# Patient Record
Sex: Male | Born: 1950
Health system: Southern US, Community
[De-identification: ages and names within clinical notes are randomized; demographics above are authoritative.]

## PROBLEM LIST (undated history)

## (undated) DIAGNOSIS — G4730 Sleep apnea, unspecified: Secondary | ICD-10-CM

## (undated) DIAGNOSIS — K219 Gastro-esophageal reflux disease without esophagitis: Secondary | ICD-10-CM

## (undated) DIAGNOSIS — I219 Acute myocardial infarction, unspecified: Secondary | ICD-10-CM

## (undated) DIAGNOSIS — I10 Essential (primary) hypertension: Secondary | ICD-10-CM

## (undated) DIAGNOSIS — E119 Type 2 diabetes mellitus without complications: Secondary | ICD-10-CM

## (undated) DIAGNOSIS — M1990 Unspecified osteoarthritis, unspecified site: Secondary | ICD-10-CM

## (undated) DIAGNOSIS — T4145XA Adverse effect of unspecified anesthetic, initial encounter: Secondary | ICD-10-CM

## (undated) DIAGNOSIS — T8859XA Other complications of anesthesia, initial encounter: Secondary | ICD-10-CM

## (undated) DIAGNOSIS — C801 Malignant (primary) neoplasm, unspecified: Secondary | ICD-10-CM

## (undated) HISTORY — PX: PROSTATECTOMY: SHX69

## (undated) HISTORY — PX: COLONOSCOPY: SHX174

## (undated) HISTORY — PX: HEART TRANSPLANT: SHX268

## (undated) HISTORY — DX: Acute myocardial infarction, unspecified: I21.9

## (undated) HISTORY — PX: APPENDECTOMY: SHX54

## (undated) HISTORY — PX: CARDIAC CATHETERIZATION: SHX172

---

## 1997-11-25 ENCOUNTER — Encounter: Payer: Self-pay | Admitting: Emergency Medicine

## 1997-11-25 ENCOUNTER — Inpatient Hospital Stay (HOSPITAL_COMMUNITY): Admission: EM | Admit: 1997-11-25 | Discharge: 1997-11-27 | Payer: Self-pay | Admitting: Emergency Medicine

## 1998-01-20 ENCOUNTER — Ambulatory Visit (HOSPITAL_COMMUNITY): Admission: RE | Admit: 1998-01-20 | Discharge: 1998-01-20 | Payer: Self-pay | Admitting: Interventional Cardiology

## 1999-01-19 ENCOUNTER — Encounter (HOSPITAL_COMMUNITY): Admission: RE | Admit: 1999-01-19 | Discharge: 1999-04-19 | Payer: Self-pay | Admitting: Interventional Cardiology

## 1999-04-20 ENCOUNTER — Encounter (HOSPITAL_COMMUNITY): Admission: RE | Admit: 1999-04-20 | Discharge: 1999-07-19 | Payer: Self-pay | Admitting: Interventional Cardiology

## 2004-05-27 ENCOUNTER — Encounter: Admission: RE | Admit: 2004-05-27 | Discharge: 2004-05-27 | Payer: Self-pay | Admitting: Family Medicine

## 2006-10-22 ENCOUNTER — Inpatient Hospital Stay (HOSPITAL_COMMUNITY): Admission: EM | Admit: 2006-10-22 | Discharge: 2006-10-24 | Payer: Self-pay | Admitting: Emergency Medicine

## 2006-10-22 ENCOUNTER — Encounter (INDEPENDENT_AMBULATORY_CARE_PROVIDER_SITE_OTHER): Payer: Self-pay | Admitting: General Surgery

## 2007-09-11 ENCOUNTER — Encounter: Admission: RE | Admit: 2007-09-11 | Discharge: 2007-09-11 | Payer: Self-pay | Admitting: Family Medicine

## 2007-09-21 ENCOUNTER — Encounter: Admission: RE | Admit: 2007-09-21 | Discharge: 2007-09-21 | Payer: Self-pay | Admitting: Gastroenterology

## 2007-09-26 ENCOUNTER — Encounter: Admission: RE | Admit: 2007-09-26 | Discharge: 2007-09-26 | Payer: Self-pay | Admitting: Gastroenterology

## 2008-07-27 ENCOUNTER — Emergency Department (HOSPITAL_COMMUNITY): Admission: EM | Admit: 2008-07-27 | Discharge: 2008-07-27 | Payer: Self-pay | Admitting: Emergency Medicine

## 2010-04-12 ENCOUNTER — Encounter
Admission: RE | Admit: 2010-04-12 | Discharge: 2010-04-12 | Payer: Self-pay | Source: Home / Self Care | Attending: Family Medicine | Admitting: Family Medicine

## 2010-04-28 ENCOUNTER — Encounter (HOSPITAL_COMMUNITY)
Admission: RE | Admit: 2010-04-28 | Discharge: 2010-05-04 | Payer: Self-pay | Source: Home / Self Care | Attending: Urology | Admitting: Urology

## 2010-05-10 ENCOUNTER — Ambulatory Visit: Payer: Medicare Other | Attending: Radiation Oncology | Admitting: Radiation Oncology

## 2010-05-10 DIAGNOSIS — I10 Essential (primary) hypertension: Secondary | ICD-10-CM | POA: Insufficient documentation

## 2010-05-10 DIAGNOSIS — M129 Arthropathy, unspecified: Secondary | ICD-10-CM | POA: Insufficient documentation

## 2010-05-10 DIAGNOSIS — I2510 Atherosclerotic heart disease of native coronary artery without angina pectoris: Secondary | ICD-10-CM | POA: Insufficient documentation

## 2010-05-10 DIAGNOSIS — Z9089 Acquired absence of other organs: Secondary | ICD-10-CM | POA: Insufficient documentation

## 2010-05-10 DIAGNOSIS — C61 Malignant neoplasm of prostate: Secondary | ICD-10-CM | POA: Insufficient documentation

## 2010-05-10 DIAGNOSIS — K219 Gastro-esophageal reflux disease without esophagitis: Secondary | ICD-10-CM | POA: Insufficient documentation

## 2010-05-10 DIAGNOSIS — Z48298 Encounter for aftercare following other organ transplant: Secondary | ICD-10-CM | POA: Insufficient documentation

## 2010-05-10 DIAGNOSIS — Z941 Heart transplant status: Secondary | ICD-10-CM | POA: Insufficient documentation

## 2010-05-10 DIAGNOSIS — Z510 Encounter for antineoplastic radiation therapy: Secondary | ICD-10-CM | POA: Insufficient documentation

## 2010-05-10 DIAGNOSIS — E785 Hyperlipidemia, unspecified: Secondary | ICD-10-CM | POA: Insufficient documentation

## 2010-05-10 DIAGNOSIS — E119 Type 2 diabetes mellitus without complications: Secondary | ICD-10-CM | POA: Insufficient documentation

## 2010-05-10 DIAGNOSIS — Z9079 Acquired absence of other genital organ(s): Secondary | ICD-10-CM | POA: Insufficient documentation

## 2010-07-14 LAB — URINALYSIS, ROUTINE W REFLEX MICROSCOPIC
Bilirubin Urine: NEGATIVE
Glucose, UA: NEGATIVE mg/dL
Hgb urine dipstick: NEGATIVE
Ketones, ur: NEGATIVE mg/dL
Nitrite: NEGATIVE
Protein, ur: NEGATIVE mg/dL
Specific Gravity, Urine: 1.027 (ref 1.005–1.030)
Urobilinogen, UA: 0.2 mg/dL (ref 0.0–1.0)
pH: 5.5 (ref 5.0–8.0)

## 2010-08-17 NOTE — Op Note (Signed)
NAMECOLDEN, SAMARAS NO.:  [id number]   MEDICAL RECORD NO.:  [id number]          PATIENT TYPE:  INP   LOCATION:  2550                         FACILITY:  [facility]   PHYSICIAN:  Gabrielle Dare. Janee Morn, M.D.DATE OF BIRTH:  Aug 16, 1950   DATE OF PROCEDURE:  10/22/2006  DATE OF DISCHARGE:                               OPERATIVE REPORT   PREOPERATIVE DIAGNOSIS:  Acute appendicitis.   POSTOPERATIVE DIAGNOSIS:  Acute appendicitis.   PROCEDURE:  Laparoscopic appendectomy.   SURGEON:  Gabrielle Dare. Janee Morn, MD   ANESTHESIA:  General.   HISTORY OF PRESENT ILLNESS:  The patient is a 60 year old gentleman who  is status post heart transplant, who was evaluated the emergency  department for abdominal pain.  Workup demonstrated right lower quadrant  abdominal tenderness and CT scan of the abdomen and pelvis showed acute  appendicitis.  He is brought emergently to the operating room.   PROCEDURE IN DETAIL:  Informed consent was obtained.  The patient had  received intravenous antibiotics.  He was brought to the operating room.  General anesthesia was administered by the Anesthesia staff with the  assistance of the GlideScope.  The patient does have a history of a  difficulty airway.  His abdomen was prepped and draped in a sterile  fashion.  The infraumbilical region was infiltrated with 0.25% Marcaine  with epinephrine.  An infraumbilical incision was made.  Subcutaneous  tissues were dissected down revealing the anterior fascia.  This was  divided sharply and the peritoneal cavity was entered under direct  vision without difficulty.  A 0 Vicryl pursestring suture was placed  around the fascial opening and the Hasson trocar was inserted into the  abdomen.  The abdomen was insufflated with carbon dioxide in a standard  fashion.  Under direct vision a 12 mm left lower quadrant and 5-mm right  upper quadrant port were placed.  Marcaine 0.25% with epinephrine was  used at all port  sites.  Laparoscopic exploration revealed a very  inflamed appendix that was adhesed down to the peritoneal reflection.  This was bluntly dissected up with sweeping the small bowel out of the  way without difficulty.  The mesoappendix was then divided with a total  of three firings of the endoscopic GIA with a vascular load, achieving  good hemostasis.  The base of the appendix was normal, and this was  divided with endoscopic GIA with the vascular load.  The appendix was  extremely inflamed but was not frankly perforated.  The appendix was  placed in an EndoCatch bag and removed from the abdomen via the left  lower quadrant port site.  The abdomen was then copiously irrigated.  Meticulous hemostasis was assured along the staple line using Bovie  cautery on one location.  This achieved excellent hemostasis.  The  abdomen was copiously irrigated.  We used approximately 2.5 L of saline  irrigation.  Fluid returned clear.  All staple lines were rechecked and  were dry and intact.  The remainder of the irrigation fluid was  evacuated and was clear.  The ports were removed under  direct vision.  The [facility] trocar was removed.  The pneumoperitoneum was released.  The  infraumbilical fascia was closed by tying the 0 Vicryl pursestring  suture with care not to trap any intra-abdominal contents.  All three  wounds were copiously irrigated.  The skin of each was closed with a running 4-0 Vicryl subcuticular  stitch.  Sponge, needle and instrument counts were correct.  Benzoin and  Steri-Strips and sterile dressings were applied.  The patient tolerated  the procedure well without apparent complication and was taken to the  recovery room in stable condition.      Gabrielle Dare Janee Morn, M.D.  Electronically Signed     BET/MEDQ  D:  10/22/2006  T:  10/22/2006  Job:  [id number]

## 2010-08-17 NOTE — H&P (Signed)
John, [name] NO.:  [id number]   MEDICAL RECORD NO.:  [id number]          PATIENT TYPE:  EMS   LOCATION:  MAJO                         FACILITY:  [facility]   PHYSICIAN:  Gabrielle Dare. Janee Morn, M.D.DATE OF BIRTH:  Feb 01, 1951   DATE OF ADMISSION:  10/22/2006  DATE OF DISCHARGE:                              HISTORY & PHYSICAL   CHIEF COMPLAINT:  Abdominal pain.   HISTORY OF PRESENT ILLNESS:  [name] is a 60 year old gentleman who  has a one-week history of abdominal bloating.  He developed some more  generalized abdominal pain yesterday that has gradually localized to his  right lower quadrant.  He came to the [facility]  for evaluation.  CT scan of the abdomen and pelvis shows acute  appendicitis.   PAST MEDICAL HISTORY:  1. Coronary artery disease.  2. Prostate cancer.  3. Diabetes mellitus.  4. Multiple myocardial infarctions.   PAST SURGICAL HISTORY:  1. Prostatectomy.  2. Heart transplant; his transplant was done at [facility] in 2000.   SOCIAL HISTORY:  He does not smoke or drink alcohol.   ALLERGIES:  No known drug allergies.   CURRENT MEDICATIONS:  1. Aspirin 81 mg daily.  2. Calcium 600 mg b.i.d.  3. CellCept 500 mg b.i.d.  4. Glipizide 2.5 mg b.i.d.  5. Lisinopril 10 mg as needed.  6. Magnesium 133 mg daily.  7. Niacin 1000 mg b.i.d.  8. Prograf 1 mg b.i.d.  9. Ranitidine 75 mg daily.   PHYSICAL EXAMINATION:  VITAL SIGNS:  Temperature is 99.4, pulse 98,  respirations 20, blood pressure 144/81.  GENERAL:  He is awake and alert.  NECK:  Supple with no tenderness.  CHEST:  Has a scar from his old mediastinotomy.  LUNGS:  Clear to auscultation.  HEART:  Regular.  ABDOMEN:  Has some tenderness in the right lower quadrant with voluntary  guarding.  Bowel sounds are hypoactive.  He has other scars from his  prostatectomy as well.  EXTREMITIES:  Have no significant edema.  SKIN:  Warm and dry with no rashes.  NEUROLOGIC  EXAMINATION:  Glasgow Coma Scale is 15.  He is moving all  extremities well.   LABORATORY STUDIES:  Sodium 138, potassium 4.1, chloride 102, CO2 26,  BUN 13, glucose 147, lipase 17, white blood cell count 17.3, hemoglobin  18.1, platelets 177.   IMPRESSION:  Acute appendicitis.   PLAN:  Take him to the operating room emergently for a laparoscopic  appendectomy.  He is receiving intravenous antibiotics.  Procedure,  risks and benefits were discussed in detail with the patient.  Questions  were answered.      Gabrielle Dare Janee Morn, M.D.  Electronically Signed     BET/MEDQ  D:  10/22/2006  T:  10/22/2006  Job:  [id number]

## 2010-08-20 NOTE — Discharge Summary (Signed)
NAMEYASER, HARVILL NO.:  [id number]   MEDICAL RECORD NO.:  [id number]          PATIENT TYPE:  INP   LOCATION:  4731                         FACILITY:  [facility]   PHYSICIAN:  Sharlet Salina T. Hoxworth, M.D.DATE OF BIRTH:  06-16-1950   DATE OF ADMISSION:  10/22/2006  DATE OF DISCHARGE:  10/24/2006                               DISCHARGE SUMMARY   CHIEF COMPLAINT/REASON FOR ADMISSION:  Mr. Murrell is a 60 year old male  patient who had been having abdominal bloating for 1 week.  Pain became  more localized in the right lower quadrant, who presented to the [facility]  ER.  He had a low-grade fever of 99.4.  on abdominal exam, he had  tenderness in the right lower quadrant with voluntary guarding.  His  white count was elevated at 17,300, and clinical exam was suspicious for  acute appendicitis.  Therefore, he was admitted by Dr. Janee Morn with a  diagnosis of acute appendicitis.   HOSPITAL COURSE:  The patient was taken from the ER to the OR, where he  underwent a laparoscopic appendectomy for acute nonperforated  appendicitis.   On postoperative day 1, the patient was having marked decrease in pain  as compared to preoperative.  He was tolerating a solid diet, but he was  spiking fevers up to 101.  Therefore, his Unasyn was restarted.  He had  only received several doses in the immediate postoperative period.  The  patient also has a history of immunocompromised state for medications  secondary to heart transplant, and in addition, his IV fluids were  increased to 150  in the ER because of fever.   On postoperative day 2, the patient had improved remarkably.  His white  count was down to 6900.  Neutrophils 52%.  He is tolerating a diet.  His  pain was better controlled.  His abdomen was soft with bowel sounds  present.  He was otherwise deemed appropriate for discharge home.   FINAL DISCHARGE DIAGNOSES:  Acute nonperforated appendicitis status post  laparoscopic  appendectomy.   DISCHARGE MEDICATIONS:  The patient will resume the same medications he  was taking prior to admission, which include:  1. Aspirin 81 mg daily.  2. Calcium 600 mg b.i.d.  3. CellCept 500 mg b.i.d.  4. Diltiazem 120 mg daily.  5. Glipizide 2.5 mg b.i.d.  6. Lisinopril 10 mg daily.  7. Magnesium 133 mg 8 tabs daily.  8. Niacin 1000 mg b.i.d.  9. Lovaza 2 mg b.i.d.  10.Prograf 1 mg b.i.d.  11.Ranitidine 75 mg daily.  12.Vytorin 5/10 daily.  13.Percocet 5/325 1 to 2 tabs every 4 hours as needed for pain.  14.Augmentin 875 mg b.i.d. for 5 days.   WOUND CARE:  We will ask __________ to followup.   DIET:  Low-sodium, heart-healthy.   ACTIVITY:  Increase activity slowly, may walk up steps, shower for the  next 2 weeks.  No lifting more than 15 pounds the next 2 weeks.  No  driving while taking Percocet.   FOLLOWUP:  He will need to call Dr. Carollee Massed office to be seen  in 1  week.   ADDITIONAL INSTRUCTIONS:  He is to call the surgeon if:  1. Fevers greater than 101 degrees Fahrenheit.  2. New  or increased belly pain.  3. Nausea, vomiting, or diarrhea.  4. Increased drainage from wounds.      Allison L. Rennis Harding, N.P.      Lorne Skeens. Hoxworth, M.D.  Electronically Signed    ALE/MEDQ  D:  11/27/2006  T:  11/27/2006  Job:  [id number]

## 2010-09-09 ENCOUNTER — Ambulatory Visit: Payer: PRIVATE HEALTH INSURANCE | Admitting: Radiation Oncology

## 2010-10-07 ENCOUNTER — Ambulatory Visit: Payer: PRIVATE HEALTH INSURANCE | Admitting: Radiation Oncology

## 2011-01-17 LAB — DIFFERENTIAL
Basophils Absolute: 0
Basophils Absolute: 0
Basophils Absolute: 0
Basophils Relative: 0
Basophils Relative: 0
Basophils Relative: 0
Eosinophils Absolute: 0
Eosinophils Absolute: 0
Eosinophils Absolute: 0.1
Eosinophils Relative: 0
Eosinophils Relative: 0
Eosinophils Relative: 1
Lymphocytes Relative: 11 — ABNORMAL LOW
Lymphocytes Relative: 22
Lymphocytes Relative: 38
Lymphs Abs: 2
Lymphs Abs: 2.6
Monocytes Absolute: 0.6
Monocytes Absolute: 0.9 — ABNORMAL HIGH
Monocytes Absolute: 0.9 — ABNORMAL HIGH
Monocytes Relative: 5
Monocytes Relative: 9
Neutro Abs: 14.4 — ABNORMAL HIGH
Neutro Abs: 3.6
Neutrophils Relative %: 52
Neutrophils Relative %: 83 — ABNORMAL HIGH

## 2011-01-17 LAB — CBC
HCT: 36.7 — ABNORMAL LOW
Hemoglobin: 12.9 — ABNORMAL LOW
Hemoglobin: 18.1 — ABNORMAL HIGH
MCHC: 34.2
MCHC: 35
MCV: 90
MCV: 90.7
Platelets: 141 — ABNORMAL LOW
Platelets: 162
Platelets: 177
RBC: 4.04 — ABNORMAL LOW
RDW: 12.5
RDW: 12.6
WBC: 10.4
WBC: 6.9

## 2011-01-17 LAB — BASIC METABOLIC PANEL
BUN: 6
CO2: 27
Calcium: 8 — ABNORMAL LOW
Chloride: 108
Creatinine, Ser: 0.9
GFR calc Af Amer: >60
GFR calc non Af Amer: >60
Glucose, Bld: 142 — ABNORMAL HIGH
Potassium: 3.9
Sodium: 142

## 2011-01-17 LAB — COMPREHENSIVE METABOLIC PANEL
ALT: 63 — ABNORMAL HIGH
AST: 41 — ABNORMAL HIGH
Albumin: 4.3
Alkaline Phosphatase: 52
BUN: 13
CO2: 26
Calcium: 9.9
Chloride: 102
Creatinine, Ser: 0.91
GFR calc Af Amer: >60
GFR calc non Af Amer: >60
Glucose, Bld: 147 — ABNORMAL HIGH
Potassium: 4.1
Sodium: 138
Total Bilirubin: 1.3 — ABNORMAL HIGH
Total Protein: 7.2

## 2011-01-17 LAB — URINALYSIS, ROUTINE W REFLEX MICROSCOPIC
Glucose, UA: NEGATIVE
Hgb urine dipstick: NEGATIVE
pH: 6

## 2011-01-17 LAB — CULTURE, BLOOD (ROUTINE X 2): Culture: NO GROWTH

## 2011-02-10 ENCOUNTER — Telehealth: Payer: Self-pay | Admitting: Radiation Oncology

## 2011-02-10 NOTE — Telephone Encounter (Signed)
02/10/11 4:25pm Patient phoned asking about previous billing and stated that [facility] told him that Dr. Kathrynn Running did not become in-network with them until June 2012.  Have called Heather at [employer] to ask if she knows about this and told patient I would call him back.

## 2013-05-09 ENCOUNTER — Other Ambulatory Visit: Payer: Self-pay | Admitting: Gastroenterology

## 2013-05-09 DIAGNOSIS — R109 Unspecified abdominal pain: Secondary | ICD-10-CM

## 2013-05-14 ENCOUNTER — Ambulatory Visit
Admission: RE | Admit: 2013-05-14 | Discharge: 2013-05-14 | Disposition: A | Discharge status: Home / Self Care | Payer: BC Managed Care – PPO | Source: Ambulatory Visit | Attending: Gastroenterology | Admitting: Gastroenterology

## 2013-05-14 DIAGNOSIS — R109 Unspecified abdominal pain: Secondary | ICD-10-CM

## 2013-05-14 MED ORDER — IOHEXOL 300 MG/ML  SOLN
75.0000 mL | Freq: Once | INTRAMUSCULAR | Status: AC | PRN
  Administered 2013-05-14: 75 mL via INTRAVENOUS

## 2013-05-29 ENCOUNTER — Other Ambulatory Visit: Payer: Self-pay | Admitting: Gastroenterology

## 2013-05-30 NOTE — Addendum Note (Signed)
Addended by: Wilford Corner on: 05/30/2013 01:16 PM   Modules accepted: Orders

## 2013-05-31 ENCOUNTER — Encounter (HOSPITAL_COMMUNITY): Payer: Self-pay | Admitting: *Deleted

## 2013-05-31 NOTE — Progress Notes (Signed)
05/31/13 1737  OBSTRUCTIVE SLEEP APNEA  Have you ever been diagnosed with sleep apnea through a sleep study? No  Do you snore loudly (loud enough to be heard through closed doors)?  0  Do you often feel tired, fatigued, or sleepy during the daytime? 1  Has anyone observed you stop breathing during your sleep? 0  Do you have, or are you being treated for high blood pressure? 1  Age over 61 years old? 1  Gender: 1  Obstructive Sleep Apnea Score 4  Score 4 or greater  Results sent to PCP

## 2013-06-03 ENCOUNTER — Encounter (HOSPITAL_COMMUNITY): Payer: Self-pay | Admitting: *Deleted

## 2013-06-03 ENCOUNTER — Encounter (HOSPITAL_COMMUNITY)
Admission: RE | Disposition: A | Discharge status: Home / Self Care | Payer: Self-pay | Source: Ambulatory Visit | Attending: Gastroenterology

## 2013-06-03 ENCOUNTER — Encounter (HOSPITAL_COMMUNITY): Payer: BC Managed Care – PPO | Admitting: Anesthesiology

## 2013-06-03 ENCOUNTER — Ambulatory Visit (HOSPITAL_COMMUNITY): Payer: BC Managed Care – PPO | Admitting: Anesthesiology

## 2013-06-03 ENCOUNTER — Ambulatory Visit (HOSPITAL_COMMUNITY)
Admission: RE | Admit: 2013-06-03 | Discharge: 2013-06-03 | Disposition: A | Discharge status: Home / Self Care | Payer: BC Managed Care – PPO | Source: Ambulatory Visit | Attending: Gastroenterology | Admitting: Gastroenterology

## 2013-06-03 DIAGNOSIS — R1084 Generalized abdominal pain: Secondary | ICD-10-CM | POA: Diagnosis present

## 2013-06-03 DIAGNOSIS — R194 Change in bowel habit: Secondary | ICD-10-CM | POA: Diagnosis present

## 2013-06-03 DIAGNOSIS — Z9861 Coronary angioplasty status: Secondary | ICD-10-CM | POA: Insufficient documentation

## 2013-06-03 DIAGNOSIS — Z8546 Personal history of malignant neoplasm of prostate: Secondary | ICD-10-CM | POA: Insufficient documentation

## 2013-06-03 DIAGNOSIS — I2510 Atherosclerotic heart disease of native coronary artery without angina pectoris: Secondary | ICD-10-CM | POA: Insufficient documentation

## 2013-06-03 DIAGNOSIS — R195 Other fecal abnormalities: Secondary | ICD-10-CM | POA: Diagnosis present

## 2013-06-03 DIAGNOSIS — Z794 Long term (current) use of insulin: Secondary | ICD-10-CM | POA: Insufficient documentation

## 2013-06-03 DIAGNOSIS — K2990 Gastroduodenitis, unspecified, without bleeding: Secondary | ICD-10-CM

## 2013-06-03 DIAGNOSIS — K648 Other hemorrhoids: Secondary | ICD-10-CM | POA: Insufficient documentation

## 2013-06-03 DIAGNOSIS — K5730 Diverticulosis of large intestine without perforation or abscess without bleeding: Secondary | ICD-10-CM | POA: Insufficient documentation

## 2013-06-03 DIAGNOSIS — Z941 Heart transplant status: Secondary | ICD-10-CM | POA: Insufficient documentation

## 2013-06-03 DIAGNOSIS — I10 Essential (primary) hypertension: Secondary | ICD-10-CM | POA: Insufficient documentation

## 2013-06-03 DIAGNOSIS — Z87891 Personal history of nicotine dependence: Secondary | ICD-10-CM | POA: Insufficient documentation

## 2013-06-03 DIAGNOSIS — K219 Gastro-esophageal reflux disease without esophagitis: Secondary | ICD-10-CM | POA: Insufficient documentation

## 2013-06-03 DIAGNOSIS — E119 Type 2 diabetes mellitus without complications: Secondary | ICD-10-CM | POA: Insufficient documentation

## 2013-06-03 DIAGNOSIS — K2970 Gastritis, unspecified, without bleeding: Secondary | ICD-10-CM | POA: Insufficient documentation

## 2013-06-03 HISTORY — PX: COLONOSCOPY WITH PROPOFOL: SHX5780

## 2013-06-03 HISTORY — PX: ESOPHAGOGASTRODUODENOSCOPY (EGD) WITH PROPOFOL: SHX5813

## 2013-06-03 HISTORY — DX: Essential (primary) hypertension: I10

## 2013-06-03 HISTORY — DX: Gastro-esophageal reflux disease without esophagitis: K21.9

## 2013-06-03 HISTORY — DX: Other complications of anesthesia, initial encounter: T88.59XA

## 2013-06-03 HISTORY — DX: Malignant (primary) neoplasm, unspecified: C80.1

## 2013-06-03 HISTORY — DX: Type 2 diabetes mellitus without complications: E11.9

## 2013-06-03 HISTORY — DX: Adverse effect of unspecified anesthetic, initial encounter: T41.45XA

## 2013-06-03 LAB — GLUCOSE, CAPILLARY: GLUCOSE-CAPILLARY: 138 mg/dL — AB (ref 70–99)

## 2013-06-03 SURGERY — ESOPHAGOGASTRODUODENOSCOPY (EGD) WITH PROPOFOL
Anesthesia: Monitor Anesthesia Care

## 2013-06-03 MED ORDER — LACTATED RINGERS IV SOLN
INTRAVENOUS | Status: DC
  Administered 2013-06-03: 1000 mL via INTRAVENOUS

## 2013-06-03 MED ORDER — SODIUM CHLORIDE 0.9 % IV SOLN: INTRAVENOUS | Status: DC

## 2013-06-03 MED ORDER — MIDAZOLAM HCL 5 MG/5ML IJ SOLN
INTRAMUSCULAR | Status: DC | PRN
  Administered 2013-06-03 (×2): 1 mg via INTRAVENOUS

## 2013-06-03 MED ORDER — PROPOFOL INFUSION 10 MG/ML OPTIME
INTRAVENOUS | Status: DC | PRN
  Administered 2013-06-03: 100 ug/kg/min via INTRAVENOUS

## 2013-06-03 MED ORDER — LACTATED RINGERS IV SOLN
INTRAVENOUS | Status: DC | PRN
  Administered 2013-06-03: 12:00:00 via INTRAVENOUS

## 2013-06-03 MED ORDER — ONDANSETRON HCL 4 MG/2ML IJ SOLN
INTRAMUSCULAR | Status: DC | PRN
  Administered 2013-06-03: 4 mg via INTRAVENOUS

## 2013-06-03 NOTE — Interval H&P Note (Signed)
History and Physical Interval Note:  06/03/2013 11:44 AM  John [name]  has presented today for surgery, with the diagnosis of hem.positive  The various methods of treatment have been discussed with the patient and family. After consideration of risks, benefits and other options for treatment, the patient has consented to  Procedure(s): ESOPHAGOGASTRODUODENOSCOPY (EGD) WITH PROPOFOL (N/A) COLONOSCOPY WITH PROPOFOL (N/A) as a surgical intervention .  The patient's history has been reviewed, patient examined, no change in status, stable for surgery.  I have reviewed the patient's chart and labs.  Questions were answered to the patient's satisfaction.     [place] C.

## 2013-06-03 NOTE — H&P (Signed)
  Date of Initial H&P: 05/09/13  History reviewed, patient examined, no change in status, stable for surgery.

## 2013-06-03 NOTE — Anesthesia Postprocedure Evaluation (Signed)
  Anesthesia Post-op Note  Patient: John [name]  Procedure(s) Performed: Procedure(s): ESOPHAGOGASTRODUODENOSCOPY (EGD) WITH PROPOFOL (N/A) COLONOSCOPY WITH PROPOFOL (N/A)  Patient Location: Endoscopy Unit  Anesthesia Type:MAC  Level of Consciousness: awake, alert , oriented and patient cooperative  Airway and Oxygen Therapy: Patient Spontanous Breathing and Patient connected to nasal cannula oxygen  Post-op Pain: none  Post-op Assessment: Post-op Vital signs reviewed, Patient's Cardiovascular Status Stable, Respiratory Function Stable, Patent Airway, No signs of Nausea or vomiting and Pain level controlled  Post-op Vital Signs: Reviewed and stable  Complications: No apparent anesthesia complications

## 2013-06-03 NOTE — Anesthesia Preprocedure Evaluation (Addendum)
Anesthesia Evaluation  Patient identified by MRN, date of birth, ID band Patient awake    Reviewed: Allergy & Precautions, H&P , NPO status , Patient's Chart, lab work & pertinent test results  History of Anesthesia Complications (+) DIFFICULT AIRWAY and history of anesthetic complications  Airway Mallampati: III TM Distance: >3 FB Neck ROM: Full    Dental  (+) Teeth Intact, Dental Advisory Given   Pulmonary former smoker,  breath sounds clear to auscultation  Pulmonary exam normal       Cardiovascular hypertension, Pt. on medications + CAD and + Cardiac Stents Rhythm:Regular Rate:Normal  S/p heart transplant in 2000 and 2013   Pt followed up with cardiologist Dr. Stann Mainland at [facility] in February, normal exam and Echo per pt. 2/15 ECHO: EF 55%, valves OK    Neuro/Psych negative neurological ROS     GI/Hepatic Neg liver ROS, GERD-  Medicated and Controlled,  Endo/Other  diabetes, Insulin DependentGlucose 138  Renal/GU Renal InsufficiencyRenal disease (h/o ARF s/p heart transplant, required transient dialysis)negative Renal ROS   Hx of prostate cancer    Musculoskeletal   Abdominal   Peds  Hematology   Anesthesia Other Findings   Reproductive/Obstetrics                       Anesthesia Physical Anesthesia Plan  ASA: IV  Anesthesia Plan: MAC   Post-op Pain Management:    Induction: Intravenous  Airway Management Planned: Natural Airway and Simple Face Mask  Additional Equipment:   Intra-op Plan:   Post-operative Plan:   Informed Consent: I have reviewed the patients History and Physical, chart, labs and discussed the procedure including the risks, benefits and alternatives for the proposed anesthesia with the patient or authorized representative who has indicated his/her understanding and acceptance.   Dental advisory given  Plan Discussed with: Anesthesiologist, Surgeon and  CRNA  Anesthesia Plan Comments: (Plan routine monitors, MAC  )       Anesthesia Quick Evaluation

## 2013-06-03 NOTE — Preoperative (Signed)
Beta Blockers   Reason not to administer Beta Blockers:Not Applicable 

## 2013-06-03 NOTE — Transfer of Care (Signed)
Immediate Anesthesia Transfer of Care Note  Patient: John [name]  Procedure(s) Performed: Procedure(s): ESOPHAGOGASTRODUODENOSCOPY (EGD) WITH PROPOFOL (N/A) COLONOSCOPY WITH PROPOFOL (N/A)  Patient Location: Endoscopy Unit  Anesthesia Type:MAC  Level of Consciousness: awake, alert  and oriented  Airway & Oxygen Therapy: Patient Spontanous Breathing and Patient connected to nasal cannula oxygen  Post-op Assessment: Report given to PACU RN and Post -op Vital signs reviewed and stable  Post vital signs: Reviewed and stable  Complications: No apparent anesthesia complications

## 2013-06-03 NOTE — Discharge Instructions (Addendum)
Repeat colonoscopy in 10 years. If rectal bleeding or rectal pain develop, then let us know and use Anusol suppositories as needed (office will call in for you).Gastrointestinal Endoscopy Care After Refer to this sheet in the next few weeks. These instructions provide you with information on caring for yourself after your procedure. Your caregiver may also give you more specific instructions. Your treatment has been planned according to current medical practices, but problems sometimes occur. Call your caregiver if you have any problems or questions after your procedure. HOME CARE INSTRUCTIONS  If you were given medicine to help you relax (sedative), do not drive, operate machinery, or sign important documents for 24 hours.  Avoid alcohol and hot or warm beverages for the first 24 hours after the procedure.  Only take over-the-counter or prescription medicines for pain, discomfort, or fever as directed by your caregiver. You may resume taking your normal medicines unless your caregiver tells you otherwise. Ask your caregiver when you may resume taking medicines that may cause bleeding, such as aspirin, clopidogrel, or warfarin.  You may return to your normal diet and activities on the day after your procedure, or as directed by your caregiver. Walking may help to reduce any bloated feeling in your abdomen.  Drink enough fluids to keep your urine clear or pale yellow.  You may gargle with salt water if you have a sore throat. SEEK IMMEDIATE MEDICAL CARE IF:  You have severe nausea or vomiting.  You have severe abdominal pain, abdominal cramps that last longer than 6 hours, or abdominal swelling (distention).  You have severe shoulder or back pain.  You have trouble swallowing.  You have shortness of breath, your breathing is shallow, or you are breathing faster than normal.  You have a fever or a rapid heartbeat.  You vomit blood or material that looks like coffee grounds.  You have  bloody, black, or tarry stools. MAKE SURE YOU:  Understand these instructions.  Will watch your condition.  Will get help right away if you are not doing well or get worse. Document Released: 11/03/2003 Document Revised: 09/20/2011 Document Reviewed: 06/21/2011 [facility] Patient Information 2014 [place], [place]. Colonoscopy Care After These instructions give you information on caring for yourself after your procedure. Your doctor may also give you more specific instructions. Call your doctor if you have any problems or questions after your procedure. HOME CARE  Take it easy for the next 24 hours.  Rest.  Walk or use warm packs on your belly (abdomen) if you have belly cramping or gas.  Do not drive for 24 hours.  You may shower.  Do not sign important papers or use machinery for 24 hours.  Drink enough fluids to keep your pee (urine) clear or pale yellow.  Resume your normal diet. Avoid heavy or fried foods.  Avoid alcohol.  Continue taking your normal medicines.  Only take medicine as told by your doctor. Do not take aspirin. If you had growths (polyps) removed:  Do not take aspirin.  Do not drink alcohol for 7 days or as told by your doctor.  Eat a soft diet for 24 hours. GET HELP RIGHT AWAY IF:  You have a fever.  You pass clumps of tissue (blood clots) or fill the toilet with blood.  You have belly pain that gets worse and medicine does not help.  Your belly is puffy (swollen).  You feel sick to your stomach (nauseous) or throw up (vomit). MAKE SURE YOU:  Understand these instructions.  Will watch your condition.  Will get help right away if you are not doing well or get worse. Document Released: 04/23/2010 Document Revised: 06/13/2011 Document Reviewed: 11/26/2012 [facility] Patient Information 2014 [place].

## 2013-06-03 NOTE — Op Note (Signed)
[facility] [place], [address]   COLONOSCOPY PROCEDURE REPORT  PATIENT: John [name], John [name]  MR#: [id number] BIRTHDATE: 20-Jan-1951 , 62  yrs. old GENDER: Male ENDOSCOPIST: Wilford Corner, MD REFERRED JJ:HERDEYCXK Shaw, M.D. PROCEDURE DATE:  06/03/2013 PROCEDURE:   Colonoscopy, diagnostic ASA CLASS:   Class IV INDICATIONS:heme-positive stool. MEDICATIONS: See Anesthesia Report.  DESCRIPTION OF PROCEDURE:   After the risks benefits and alternatives of the procedure were thoroughly explained, informed consent was obtained.  The Pentax Ped Colon [id number]  endoscope was introduced through the anus and advanced to the cecum, which was identified by both the appendix and ileocecal valve , limited by No adverse events experienced.   The quality of the prep was good. . The instrument was then slowly withdrawn as the colon was fully examined.     FINDINGS:  Rectal exam unremarkable.  Pediatric colonoscope inserted into the colon and advanced to the cecum, where the appendiceal orifice and ileocecal valve were identified.    The terminal ileum was intubated and was normal in appearance. On careful withdrawal of the colonoscope one small sigmoid diverticuli was seen. Retroflexion revealed medium-sized nonbleeding internal hemorrhoids.  COMPLICATIONS: None  IMPRESSION:     1. Medium-sized internal hemorrhoids (likely source of heme positive stool) 2. Sigmoid diverticulum  RECOMMENDATIONS: Increase fiber in diet; Anusol suppositories as needed; Repeat colonoscopy in 10 years    ______________________________ eSigned:  Wilford Corner, MD 06/03/2013 12:37 PM   GY:JEHUDJSHF Brigitte Pulse, MD

## 2013-06-03 NOTE — Op Note (Signed)
[facility] [place], [address]   ENDOSCOPY PROCEDURE REPORT  PATIENT: John [name], John [name]  MR#: [id number] BIRTHDATE: 07-15-1950 , 62  yrs. old GENDER: Male  ENDOSCOPIST: Wilford Corner, MD REFERRED QV:ZDGLOVFIE Shaw, M.D.  PROCEDURE DATE:  06/03/2013 PROCEDURE:   EGD, diagnostic ASA CLASS:   Class IV INDICATIONS:Heme positive stool. MEDICATIONS: See Anesthesia Report.  TOPICAL ANESTHETIC: none  DESCRIPTION OF PROCEDURE:   After the risks benefits and alternatives of the procedure were thoroughly explained, informed consent was obtained.  The PENTAX GASTOROSCOPE [id number]  endoscope was introduced through the mouth and advanced to the second portion of the duodenum , limited by Without limitations.   The instrument was slowly withdrawn as the mucosa was fully examined.     FINDINGS: The endoscope was inserted into the oropharynx and esophagus was intubated.  The gastroesophageal junction was noted to be 40 cm from the incisors.  Endoscope was advanced into the stomach, which revealed minimal erythema in the distal stomach consistent with mild gastritis. The stomach was otherwise normal in appearance.  The endoscope was advanced to the duodenal bulb and second portion of duodenum which were unremarkable.  The endoscope was withdrawn back into the stomach and retroflexion revealed a normal proximal stomach.  COMPLICATIONS: None  ENDOSCOPIC IMPRESSION:     Mild gastritis otherwise normal EGD (No source of heme positive stools seen)  RECOMMENDATIONS: Proceed with colonoscopy   REPEAT EXAM: N/A  _______________________________ Wilford Corner, MD eSigned:  Wilford Corner, MD 06/03/2013 12:31 PM    PP:IRJJOACZY Brigitte Pulse, MD  PATIENT NAME:  [name], [name] MR#: [id number]

## 2013-06-04 ENCOUNTER — Encounter (HOSPITAL_COMMUNITY): Payer: Self-pay | Admitting: Gastroenterology

## 2013-08-08 ENCOUNTER — Ambulatory Visit
Admission: RE | Admit: 2013-08-08 | Discharge: 2013-08-08 | Disposition: A | Discharge status: Home / Self Care | Payer: BC Managed Care – PPO | Source: Ambulatory Visit | Attending: Physician Assistant | Admitting: Physician Assistant

## 2013-08-08 ENCOUNTER — Other Ambulatory Visit: Payer: Self-pay | Admitting: Physician Assistant

## 2013-08-08 DIAGNOSIS — R319 Hematuria, unspecified: Secondary | ICD-10-CM

## 2014-02-17 LAB — LIPID PANEL
HDL: 29 mg/dL — AB (ref 35–70)
LDL CALC: 69 mg/dL
TRIGLYCERIDES: 195 mg/dL — AB (ref 40–160)

## 2014-02-19 ENCOUNTER — Encounter: Payer: BC Managed Care – PPO | Attending: Family Medicine | Admitting: Nutrition

## 2014-02-19 DIAGNOSIS — E119 Type 2 diabetes mellitus without complications: Secondary | ICD-10-CM | POA: Diagnosis not present

## 2014-02-19 DIAGNOSIS — Z794 Long term (current) use of insulin: Secondary | ICD-10-CM | POA: Diagnosis not present

## 2014-02-19 DIAGNOSIS — Z713 Dietary counseling and surveillance: Secondary | ICD-10-CM | POA: Insufficient documentation

## 2014-02-19 NOTE — Patient Instructions (Signed)
Fill and apply one V-go 30 every day. Give 3 button presses for every meal.   Give 1 button press for snacks. Give 1 extra button press for blood sugars over 200.

## 2014-02-19 NOTE — Progress Notes (Signed)
Patient was started on V-go 30 at 9AM.  He was instructed on how to fill, apply and use the V-Go.  He filled his V-go using Novolog and applied it to his upper abdominal area.  He was told to give 3 button presses (6u) before each meal. When asked if he always took his Levemir, he reported that he took it "5 days a week".  When asked if he took his Novolog before meals, he reported that he only takes it before breakfast, even though he is at home.   We discussed low blood sugars--symptoms and treatment.  He reported good understanding of this.  He carries Glucose tablets in his car, and at home, but " has not used them in a long time".  His diet is high in carbs.  He is eating approximately 60-90 grams at breakfast.  He will stop eating the cinnamon raisin bagels, switch to whole wheat plain bagels, and add protein to his bagels or cereal in the form of nuts, or peanut butter, to slow the blood sugar rise down, and to keep him feeling full until lunch.  Lunch and supper will ususally have less carbs, and some protein, so he was not told to change anything for now.  He sometimes eats a snack after supper like an apple.  He was told to take 1 click (2u for this).  We discussed the need to take more Novolog for meals that are higher in carbs--like pizza, or pasta.  He reported good understanding of this. He was given a list of carbohydrate servings and told to take 1 click for every 2 servings.  He reported good understanding of this and had no final questions.  He ate breakfast this AM, but took no Novolog.  His blood sugar 2 hours after this was 258.  He gave himself 1 button press (2u) without any difficulty, and said that he thought he would like this.    He was encouraged to test his blood sugar before meals and at bedtime and to call me next Monday.  He agreed to do this, and had no final questions.

## 2014-06-11 ENCOUNTER — Encounter: Payer: Self-pay | Admitting: Endocrinology

## 2014-06-11 ENCOUNTER — Ambulatory Visit (INDEPENDENT_AMBULATORY_CARE_PROVIDER_SITE_OTHER): Payer: Medicare HMO | Admitting: Endocrinology

## 2014-06-11 VITALS — BP 147/92 | HR 89 | Temp 98.3°F | Resp 14 | Ht 65.5 in | Wt 175.2 lb

## 2014-06-11 DIAGNOSIS — E785 Hyperlipidemia, unspecified: Secondary | ICD-10-CM

## 2014-06-11 DIAGNOSIS — E119 Type 2 diabetes mellitus without complications: Secondary | ICD-10-CM

## 2014-06-11 LAB — HEMOGLOBIN A1C: Hgb A1c MFr Bld: 7.3 % — ABNORMAL HIGH (ref 4.6–6.5)

## 2014-06-11 NOTE — Progress Notes (Signed)
Patient ID: John [name], male   DOB: 16-Sep-1950, 64 y.o.   MRN: [id number]           Reason for Appointment: Consultation for Type 2 Diabetes  Referring physician: Mayra Neer  History of Present Illness:          Diagnosis: Type 2 diabetes mellitus, date of diagnosis: 2006       Past history:  He thinks that diagnosis time his blood sugars are significantly higher on 400 and he was taking metformin for about 2 years Because of GI side effects with metformin he was put on insulin possibly because of significantly high readings.  However details of his previous management is not available Does not think he was tried on any other drugs or injectable medications before starting insulin He has been treated with Levemir and NovoLog for most of the duration of his diabetes  Recent history: He thinks his blood sugars have been persistently poorly controlled with regimen of Levemir and NovoLog He was taking 60 units of Levemir and 8 units of mealtime doses of NovoLog  A1c in 11/15 was 8.9% and his blood sugars would be frequently in the 200 range At that time was started on a V-go pump with a 30 unit basal and usually 8 units of NovoLog at mealtimes  With this he thinks his blood sugars were much better Also he was starting to feel a little hypoglycemic at times before supper time.  He thinks his readings are not high after meals also but does not remember the readings.  He would take 2-4 units more for high carbohydrate meals like spaghetti However more recently has not been able to get his V-go pump because of insurance limitations of mail-order sources and high cost at the drug store.  Also did not like the fact that it was prominent and would stick out under his shirt Last weekend he went back to taking Levemir and NovoLog  On his own he has researched insulin pumps and is wanting to try the Medtronic pump.  He thinks his insurance will cover this Although he thinks he has been educated  regarding meal planning and carbohydrate counting he does not count carbohydrates at his meals. Currently he is checking the sugar mostly once a day, usually in the morning        Oral hypoglycemic drugs the patient is taking are: None      Side effects from medications have been: Nausea from metformin  INSULIN regimen is described as:   60 units of Levemir and 8 units before meals of NovoLog Compliance with the medical regimen: Fair  Hypoglycemia:    Glucose monitoring:  done one time a day         Glucometer: One Touch.      Blood Glucose readings by time of day and averages from meter download:  PREMEAL Breakfast Lunch Dinner  PCS   Overall   Glucose range:  87-133    71-91   118    Median:      114    Self-care: The diet that the patient has been following is: tries to limit carbohydrates .     Meals: 3 meals per day. Breakfast is bagel and cream cheese           Exercise: walks in summer, not very active currently         Dietician visit, most recent: Unknown, has seen a dietitian a few times  Weight history: He has gained weight, lowest weight has previously been 155  Wt Readings from Last 3 Encounters:  06/11/14 175 lb 3.2 oz (79.47 kg)  06/03/13 165 lb (74.844 kg)    Glycemic control:  A1c on 02/11/14 =8.9 Microalbumin/creatinine ratio in 11/15 was 11.2 Serum creatinine in 1/16 was 1.1    Lab Results  Component Value Date   HGBA1C 7.3* 06/11/2014   Lab Results  Component Value Date   CREATININE 0.90 10/24/2006         Medication List       This list is accurate as of: 06/11/14  3:24 PM.  Always use your most recent med list.               aspirin 81 MG tablet  Take 81 mg by mouth daily.     calcium carbonate 600 MG Tabs tablet  Commonly known as:  OS-CAL  Take 600 mg by mouth 2 (two) times daily with a meal.     citalopram 20 MG tablet  Commonly known as:  CELEXA  Take 20 mg by mouth daily.     insulin aspart 100 UNIT/ML  injection  Commonly known as:  novoLOG  Inject 8 Units into the skin 3 (three) times daily before meals.     insulin detemir 100 UNIT/ML injection  Commonly known as:  LEVEMIR  Inject 58 Units into the skin at bedtime.     lisinopril 10 MG tablet  Commonly known as:  PRINIVIL,ZESTRIL  Take 10 mg by mouth daily.     magnesium oxide 400 MG tablet  Commonly known as:  MAG-OX  Take 400 mg by mouth daily.     MELATONIN PO  Take 6 mg by mouth as needed.     mycophenolate 500 MG tablet  Commonly known as:  CELLCEPT  Take 1,500 mg by mouth 2 (two) times daily.     omeprazole 20 MG capsule  Commonly known as:  PRILOSEC  Take 20 mg by mouth daily.     rosuvastatin 10 MG tablet  Commonly known as:  CRESTOR  Take 10 mg by mouth daily.     tacrolimus 1 MG capsule  Commonly known as:  PROGRAF  Take 3 mg by mouth 2 (two) times daily.     V-GO 30 Kit  by Does not apply route.        Allergies:  Allergies  Allergen Reactions  . Metformin And Related     Can't tolerate    Past Medical History  Diagnosis Date  . Diabetes mellitus without complication   . Hypertension   . Cancer     prostate  . GERD (gastroesophageal reflux disease)   . Complication of anesthesia     Problems with waking up and with intubation  . Myocardial infarction     Past Surgical History  Procedure Laterality Date  . Heart transplant      Hx; of x 2  . Prostatectomy    . Colonoscopy    . Cardiac catheterization      Novemer 2014  . Appendectomy    . Esophagogastroduodenoscopy (egd) with propofol N/A 06/03/2013    Procedure: ESOPHAGOGASTRODUODENOSCOPY (EGD) WITH PROPOFOL;  Surgeon: Lear Ng, MD;  Location: [facility];  Service: Endoscopy;  Laterality: N/A;  . Colonoscopy with propofol N/A 06/03/2013    Procedure: COLONOSCOPY WITH PROPOFOL;  Surgeon: Lear Ng, MD;  Location: [facility];  Service: Endoscopy;  Laterality: N/A;    Family History  Problem Relation Age of  Onset  . Heart disease Mother   . Heart disease Father   . Diabetes Brother   . Cancer Brother     Social History:  reports that he has quit smoking. He has never used smokeless tobacco. He reports that he drinks alcohol. He reports that he does not use illicit drugs.    Review of Systems       Vision is normal. Most recent eye exam was in 4/15        Lipids: On Crestor 2 years.  Results of lipids not available      No results found for: CHOL, HDL, LDLCALC, LDLDIRECT, TRIG, CHOLHDL                Skin: No rash or infections     Thyroid:  No  unusual fatigue, no history of thyroid disease.     The blood pressure has been usually normal at home around 122/76-80.  Tends to be higher in doctor's offices.  He is on lisinopril 10 mg only     No swelling of feet.     No shortness of breath usually on exertion.  No exertional chest tightness or palpitations  He is followed at [facility], is about 2 years since his second heart transplant and is reportedly doing well     Bowel habits: Normal.      No joint  pains.          No history of Numbness, tingling or burning in feet      Physical Examination:  BP 147/92 mmHg  Pulse 89  Temp(Src) 98.3 F (36.8 C)  Resp 14  Ht 5' 5.5" (1.664 m)  Wt 175 lb 3.2 oz (79.47 kg)  BMI 28.70 kg/m2  SpO2 98%  GENERAL:    he is averagely built and nourished.  Has abdominal obesity present HEENT:         Eye exam shows normal external appearance. Fundus exam shows no retinopathy. Oral exam shows normal mucosa .  NECK:         General:  Neck exam shows no lymphadenopathy. Carotids are normal to palpation and no bruit heard.  Thyroid is not enlarged and no nodules felt.   LUNGS:         Chest is symmetrical. Lungs are clear to auscultation.Marland Kitchen   HEART:         Heart sounds:  S1 and S2 are normal. No murmurs or clicks heard., no S3 or S4.   ABDOMEN:   There is no distention present. Liver and spleen are not palpable. No other mass or  tenderness present.  EXTREMITIES:     There is no edema. No skin lesions present.Marland Kitchen  NEUROLOGICAL:   Vibration sense is minimally  reduced in toes. Ankle jerks are absent on the left and 2+ on the right Diabetic foot exam shows normal monofilament sensation in the toes and plantar surfaces, no skin lesions or ulcers on the feet and normal pedal pulses MUSCULOSKELETAL:       There is no enlargement or deformity of the joints. Spine is normal to inspection.Marland Kitchen   SKIN:       No rash or lesions of concern.        ASSESSMENT:  Diabetes type 2 with BMI 29 and requiring insulin Although he has not been tried on GLP-1 drugs or drugs like Invokana he has had significant hyperglycemia since onset and currently doing well with insulin regimen with the  V-go pump infusion program Previously would be taking 70-80 units of insulin a day with poor control and A1c near 9%  Since he is keen on using an insulin pump which would give him more flexibility and control than the V-go pump will proceed with ordering an insulin pump.  However discussed that he may subjectively like the Omnipod better because of its waterproof nature and lack of tubing that would not be interfering with his daily activities.  Also he would be able to bolus from his glucose monitor also.  He can apply this on his upper arm on the back Basic principles of insulin pump treatment and the Omnipod were discussed and brochure given  Complications: None evident, has had normal urine microalbumin and no previous history of retinopathy  PLAN:   He will check into the details about the Omnipod pump and insurance coverage as well as some out-of-pocket expense.  Will start him on the insulin pump once he is able to get this approved.  Most likely his basal rate will need to be started at 1.0 round the clock except 0.8 between 3 PM-7 PM when he tends to get low.  Carbohydrate coverage will be about 1:10 to start with  In the meantime he does need to  start checking his blood sugars 3-4 times a day especially after meals to help adjust his mealtime coverage  He will try to watch his blood pressure closely as it tends to be high  Will review records of previous lipid panel when available  Patient Instructions  Please check blood sugars at least half the time about 2 hours after any meal and 3 times per week on waking up. Please bring blood sugar monitor to each visit. Recommended blood sugar levels about 2 hours after meal is 140-180 and on waking up 90-130     Counseling time over 50% of today's 50 minute visit  John [name] 06/11/2014, 3:24 PM   Note: This office note was prepared with Estate agent. Any transcriptional errors that result from this process are unintentional.

## 2014-06-11 NOTE — Patient Instructions (Addendum)
Please check blood sugars at least half the time about 2 hours after any meal and 3 times per week on waking up.  Please bring blood sugar monitor to each visit.  Recommended blood sugar levels about 2 hours after meal is 140-180 and on waking up 90-130   

## 2014-06-11 NOTE — Progress Notes (Signed)
Quick Note:  Please let patient know that the A1c was 7.3, probably having some high readings after meals  ______

## 2014-06-18 ENCOUNTER — Telehealth: Payer: Self-pay | Admitting: Endocrinology

## 2014-06-18 NOTE — Telephone Encounter (Signed)
Please check status of insulin pump form sent by [place] do you have this and what is the status. Please fax back if complete, or call mario # [phone] ext [phone] Send form with last 2 MD notes

## 2014-06-19 NOTE — Telephone Encounter (Signed)
Forms faxed

## 2014-06-19 NOTE — Telephone Encounter (Signed)
Please read message below. Do you still have form on your desk?

## 2014-06-19 NOTE — Telephone Encounter (Signed)
On Megan's desk

## 2014-06-25 ENCOUNTER — Other Ambulatory Visit: Payer: Self-pay | Admitting: *Deleted

## 2014-06-25 MED ORDER — INSULIN ASPART 100 UNIT/ML ~~LOC~~ SOLN: SUBCUTANEOUS | Status: DC

## 2014-06-30 ENCOUNTER — Telehealth: Payer: Self-pay | Admitting: Endocrinology

## 2014-06-30 NOTE — Telephone Encounter (Signed)
Patient will call when he needs a refill of Humalog per insurance requirement

## 2014-06-30 NOTE — Telephone Encounter (Signed)
Pt needs [place] to call #: (pt to call back with #)so we can tell them that he has tried and failed other therapies and the novolog is necessary

## 2014-06-30 NOTE — Telephone Encounter (Signed)
Needs PA on novlog  Call @1 [phone] for PA

## 2014-07-02 NOTE — Telephone Encounter (Signed)
Pt now needs [place] to call and get an appeal # [phone] on his omnipod pda. They approved the omnipod itself but not the pda Reference # [id number]

## 2014-07-03 NOTE — Telephone Encounter (Signed)
See note below   Thanks

## 2014-07-07 ENCOUNTER — Telehealth: Payer: Self-pay | Admitting: Endocrinology

## 2014-07-07 NOTE — Telephone Encounter (Signed)
Pt requesting phone about insurance not covering a item ,pt would not explain over the telphone

## 2014-07-10 ENCOUNTER — Telehealth: Payer: Self-pay | Admitting: *Deleted

## 2014-07-10 NOTE — Telephone Encounter (Signed)
Fax on Dr Ronnie Derby desk for [employer].

## 2014-07-10 NOTE — Telephone Encounter (Signed)
ok 

## 2014-07-10 NOTE — Telephone Encounter (Signed)
Patient is calling because his insurance will not pay for omni-pod insulin pump because it is disposable.  He hopes that they will pay for a Medtronics.  He will have the paperwork faxed over.  He would like to know if it is okay for him to use the Medtronics pump?

## 2014-07-15 ENCOUNTER — Other Ambulatory Visit: Payer: Self-pay | Admitting: Endocrinology

## 2014-07-15 NOTE — Telephone Encounter (Signed)
Patient called and did not leave a detailed message   Please call patient   Thank you

## 2014-07-16 NOTE — Telephone Encounter (Signed)
LVM for John [name] (at [facility]) to call me back.   RE: need the fax number to send the OV notes to.

## 2014-07-16 NOTE — Telephone Encounter (Signed)
Pt wanted to know if the paper work was sent to [employer].  Pt called medtronics and [employer] stated that they needed MD notes. ([phone]/Mario pt account manager xt: [phone]).  Pt stated that since the novolog is not covered and that he was switched to the humalog.

## 2014-07-16 NOTE — Telephone Encounter (Signed)
erx is saved in the phone note and waiting for approval of pump before rx is sent.

## 2014-07-17 NOTE — Telephone Encounter (Signed)
Spoke to [address]. He requested a second office visit note.   Faxed OV notes to [phone]  Waiting on approval from this point on.

## 2014-07-17 NOTE — Telephone Encounter (Signed)
Informed pt that information was faxed.

## 2014-07-18 ENCOUNTER — Other Ambulatory Visit: Payer: Self-pay | Admitting: *Deleted

## 2014-07-18 MED ORDER — INSULIN LISPRO 100 UNIT/ML ~~LOC~~ SOLN
SUBCUTANEOUS | Status: DC
Start: 2014-07-18 — End: 2014-10-06

## 2014-07-18 NOTE — Telephone Encounter (Signed)
He wanted to wait to have the Humilin filled until there was a decision on the Medtronic

## 2014-07-22 ENCOUNTER — Telehealth: Payer: Self-pay | Admitting: Endocrinology

## 2014-07-22 NOTE — Telephone Encounter (Signed)
Pt requesting call back

## 2014-08-18 ENCOUNTER — Encounter: Payer: Medicare HMO | Attending: Endocrinology | Admitting: Nutrition

## 2014-08-18 ENCOUNTER — Ambulatory Visit (INDEPENDENT_AMBULATORY_CARE_PROVIDER_SITE_OTHER): Payer: Medicare HMO | Admitting: Endocrinology

## 2014-08-18 ENCOUNTER — Encounter: Payer: Self-pay | Admitting: Endocrinology

## 2014-08-18 VITALS — BP 158/102 | HR 94 | Temp 98.0°F | Resp 14 | Ht 65.5 in | Wt 175.4 lb

## 2014-08-18 DIAGNOSIS — IMO0002 Reserved for concepts with insufficient information to code with codable children: Secondary | ICD-10-CM

## 2014-08-18 DIAGNOSIS — E1165 Type 2 diabetes mellitus with hyperglycemia: Secondary | ICD-10-CM | POA: Diagnosis not present

## 2014-08-18 NOTE — Patient Instructions (Addendum)
Bolus 1:8 carb ratio for meals, correction 1:40  Basal 1.0 unit except 0.9 from 3-6 pm  Check sugars before and 2 hrs after meals and 3 am

## 2014-08-18 NOTE — Progress Notes (Signed)
Patient ID: John [name], male   DOB: 1950/12/20, 64 y.o.   MRN: [id number]           Reason for Appointment: Follow-up for Type 2 Diabetes  Referring physician: Mayra Neer  History of Present Illness:          Diagnosis: Type 2 diabetes mellitus, date of diagnosis: 2006       Past history:  He thinks that diagnosis time his blood sugars are significantly higher on 400 and he was taking metformin for about 2 years Because of GI side effects with metformin he was put on insulin possibly because of significantly high readings.  However details of his previous management is not available Does not think he was tried on any other drugs or injectable medications before starting insulin He has been treated with Levemir and NovoLog for most of the duration of his diabetes  Recent history:   A1c in 11/15 was 8.9% and his blood sugars would be frequently in the 200 range with Levemir Novolog At that time was started on a V-go pump with a 30 unit basal and usually 8 units of NovoLog at mealtimes However because of difficulty with insurance coverage with this as well as somewhat inconsistent control and hypoglycemia he went back to Levemir/Novolog  He is now starting on an Omnipod insulin pump He has not checked his blood sugars much lately and mostly in the mornings which are consistently high and averaging about 228 He has only some familiarity with carbohydrate counting and is generally adjusting his Novolog based on meal size and fat content also        Oral hypoglycemic drugs the patient is taking are: None      Side effects from medications have been: Nausea from metformin  INSULIN regimen is described as:   60 units of Levemir and 8 units before meals of NovoLog Compliance with the medical regimen: Fair  Hypoglycemia:    Glucose monitoring:  done one time a day or less        Glucometer: One Touch.      Blood Glucose readings as above.  Self-care: The diet that the patient has  been following is: tries to limit carbohydrates .     Meals: 3 meals per day. Breakfast is bagel and cream cheese           Exercise: walks in summer,        Dietician visit, most recent: Unknown, has seen a dietitian a few times                Weight history: He has gained weight, lowest weight has previously been 155  Wt Readings from Last 3 Encounters:  08/18/14 175 lb 6.4 oz (79.561 kg)  06/11/14 175 lb 3.2 oz (79.47 kg)  06/03/13 165 lb (74.844 kg)   Glycemic control:  A1c on 02/11/14 =8.9 Microalbumin/creatinine ratio in 11/15 was 11.2 Serum creatinine in 1/16 was 1.1    Lab Results  Component Value Date   HGBA1C 7.3* 06/11/2014   Lab Results  Component Value Date   CREATININE 0.90 10/24/2006         Medication List       This list is accurate as of: 08/18/14 12:30 PM.  Always use your most recent med list.               aspirin 81 MG tablet  Take 81 mg by mouth daily.     calcium carbonate 600 MG  Tabs tablet  Commonly known as:  OS-CAL  Take 600 mg by mouth 2 (two) times daily with a meal.     citalopram 20 MG tablet  Commonly known as:  CELEXA  Take 20 mg by mouth daily.     insulin aspart 100 UNIT/ML injection  Commonly known as:  novoLOG  Use max 70 units per day with insulin pump     insulin detemir 100 UNIT/ML injection  Commonly known as:  LEVEMIR  Inject 58 Units into the skin at bedtime.     insulin lispro 100 UNIT/ML injection  Commonly known as:  HUMALOG  Use max 70 units per day with insulin pump     lisinopril 10 MG tablet  Commonly known as:  PRINIVIL,ZESTRIL  Take 10 mg by mouth daily.     magnesium oxide 400 MG tablet  Commonly known as:  MAG-OX  Take 400 mg by mouth daily.     MELATONIN PO  Take 6 mg by mouth as needed.     mycophenolate 500 MG tablet  Commonly known as:  CELLCEPT  Take 1,500 mg by mouth 2 (two) times daily.     omeprazole 20 MG capsule  Commonly known as:  PRILOSEC  Take 20 mg by mouth daily.      rosuvastatin 10 MG tablet  Commonly known as:  CRESTOR  Take 10 mg by mouth daily.     tacrolimus 1 MG capsule  Commonly known as:  PROGRAF  Take 3 mg by mouth 2 (two) times daily.     V-GO 30 Kit  by Does not apply route.        Allergies:  Allergies  Allergen Reactions  . Metformin And Related     Can't tolerate    Past Medical History  Diagnosis Date  . Diabetes mellitus without complication   . Hypertension   . Cancer     prostate  . GERD (gastroesophageal reflux disease)   . Complication of anesthesia     Problems with waking up and with intubation  . Myocardial infarction     Past Surgical History  Procedure Laterality Date  . Heart transplant      Hx; of x 2  . Prostatectomy    . Colonoscopy    . Cardiac catheterization      Novemer 2014  . Appendectomy    . Esophagogastroduodenoscopy (egd) with propofol N/A 06/03/2013    Procedure: ESOPHAGOGASTRODUODENOSCOPY (EGD) WITH PROPOFOL;  Surgeon: Lear Ng, MD;  Location: [facility];  Service: Endoscopy;  Laterality: N/A;  . Colonoscopy with propofol N/A 06/03/2013    Procedure: COLONOSCOPY WITH PROPOFOL;  Surgeon: Lear Ng, MD;  Location: [facility];  Service: Endoscopy;  Laterality: N/A;    Family History  Problem Relation Age of Onset  . Heart disease Mother   . Heart disease Father   . Diabetes Brother   . Cancer Brother     Social History:  reports that he has quit smoking. He has never used smokeless tobacco. He reports that he drinks alcohol. He reports that he does not use illicit drugs.    Review of Systems   Most recent eye exam was in 4/15        Lipids: On Crestor 2 years.  Results of lipids not available      No results found for: CHOL, HDL, LDLCALC, LDLDIRECT, TRIG, CHOLHDL              He is followed at [facility]  regularly, is about 2 years since his second heart transplant and is reportedly doing well    Physical Examination:  BP 158/102 mmHg  Pulse 94   Temp(Src) 98 F (36.7 C)  Resp 14  Ht 5' 5.5" (1.664 m)  Wt 175 lb 6.4 oz (79.561 kg)  BMI 28.73 kg/m2  SpO2 97%       ASSESSMENT:  Diabetes type 2 with BMI 29 and requiring insulin He is starting an insulin pump today using the Omnipod He will be instructed by the nurse educator Discussed basic principles of using insulin pump and the need to adjust the basal rates at various times based on blood sugar patterns Also discussed importance of carbohydrate counting to manage mealtime boluses He does try to be active and usually in the mornings but does not do any intense physical activity   PLAN:   He will start with 1 unit per hour on the insulin pump with lower basal rate in the late afternoon  He will be instructed on 1:8 carbohydrate ratio for mealtime coverage and nurse educator will provide him booklets on carb counting  Correction factor will be 1:40 with target 120  Will review his blood sugars and follow-up tomorrow  Patient Instructions  Bolus 1:8 carb ratio for meals, correction 1:40  Basal 1.0 unit except 0.9 from 3-6 pm  Check sugars before and 2 hrs after meals and 3 am      [facility] 08/18/2014, 12:30 PM   Note: This office note was prepared with Estate agent. Any transcriptional errors that result from this process are unintentional.

## 2014-08-19 ENCOUNTER — Ambulatory Visit (INDEPENDENT_AMBULATORY_CARE_PROVIDER_SITE_OTHER): Payer: Medicare HMO | Admitting: Endocrinology

## 2014-08-19 ENCOUNTER — Other Ambulatory Visit: Payer: Self-pay | Admitting: *Deleted

## 2014-08-19 ENCOUNTER — Encounter: Payer: Medicare HMO | Admitting: Nutrition

## 2014-08-19 VITALS — BP 130/86 | HR 90 | Temp 98.6°F | Resp 16 | Wt 175.4 lb

## 2014-08-19 DIAGNOSIS — E1165 Type 2 diabetes mellitus with hyperglycemia: Secondary | ICD-10-CM

## 2014-08-19 DIAGNOSIS — E119 Type 2 diabetes mellitus without complications: Secondary | ICD-10-CM

## 2014-08-19 DIAGNOSIS — IMO0002 Reserved for concepts with insufficient information to code with codable children: Secondary | ICD-10-CM

## 2014-08-19 MED ORDER — GLUCOSE BLOOD VI STRP: ORAL_STRIP | Status: AC

## 2014-08-19 MED ORDER — ACCU-CHEK FASTCLIX LANCETS MISC: Status: AC

## 2014-08-19 NOTE — Progress Notes (Signed)
John [name] was instucted on the use of his OmniPod insulin pump.  He had already put the date/time, name etc. Into his PDM.  He entered the basal rates and carb calculations with little assistance from me.:  Basal rates :  1.0u/hr except from 3-6PM: 0.9u/hr Carb ratio: 8, sensitivitiy: 40 timing 4 hours, target: 90-110, correction over 110. We reviewed how to give a bolus and he re demonstrated this correctly X2.   He was given a form to record blood sugars and told to test ac, 2hr. Pc, HS and 3AM.   He took his lantus last night at10 PM, and so his pump was put in a 100 % basal reduction until 8PM.   He had no final questions.

## 2014-08-19 NOTE — Patient Instructions (Signed)
No change; call if glucose unusually hi or low

## 2014-08-19 NOTE — Progress Notes (Signed)
He reported no difficulties giving boluses at lunch and at supper.  He had applied the pod yesterday to his upper left arm, and said it hurt all day yesterday.  We filled a new pod and applied it to his upper right arm, and he reported that he did not hurt.   We reviewed how/when to do a duel wave bolus, and temp. Basal rates.   He was given a sheet on high blood sugar protocols for pump wearers, and we reviewed this.  He had no questions.  He was given another sheet off different sites to put his pods.  It was stressed that he needs to rotate sites and not to use the same site for 1-2 weeks.  He reported good understanding of this. He has used [employer] for [employer], and had no questions about how to use this.   He signed off on understanding all topics and and had no final questions.  He will continue to test ac and HS and see Dr. Dwyane Dee tomorrow.

## 2014-08-19 NOTE — Progress Notes (Signed)
Patient ID: John [name], male   DOB: 1950-09-02, 64 y.o.   MRN: [id number]           Reason for Appointment: Follow-up for Type 2 Diabetes  Referring physician: Mayra Neer  History of Present Illness:          Diagnosis: Type 2 diabetes mellitus, date of diagnosis: 2006       Past history:  He thinks that diagnosis time his blood sugars are significantly higher on 400 and he was taking metformin for about 2 years Because of GI side effects with metformin he was put on insulin possibly because of significantly high readings.  However details of his previous management is not available Does not think he was tried on any other drugs or injectable medications before starting insulin He has been treated with Levemir and NovoLog for most of the duration of his diabetes  Recent history:   PUMP settings:  Basal rate 1.0 except 0.9 from 3 PM-6 PM   Carb ratio 1:8.  Correction 1:40 with target 90 -110    A1c in 11/15 was 8.9% and his blood sugars would be frequently in the 200 range with Levemir Novolog At that time was started on a V-go pump with a 30 unit basal and usually 8 units of NovoLog at mealtimes However because of difficulty with insurance coverage with this as well as somewhat inconsistent control and hypoglycemia he went back to Levemir/Novolog  He is now starting on an Omnipod insulin pump  On 08/18/14.  Last evening after starting the pump he had discomfort in the local area but this is not present now and has no discomfort with the bolus  Blood sugars there are fairly good with readings mostly around 140 since last evening  He did have high reading after lunch but eat out at a restaurant and did not account for any fat content, also  Had white rice   He has been able to count carbohydrates fairly well        Oral hypoglycemic drugs the patient is taking are: None      Side effects from medications have been: Nausea from metformin  Compliance with the medical regimen: Fair   Hypoglycemia:    Glucose monitoring:  done one time a day or less        Glucometer: One Touch.      Blood Glucose readings  PRE-MEAL Breakfast Lunch Dinner Bedtime  4 AM  Glucose range:  146  154  140  149  129  Mean/median:        POST-MEAL PC Breakfast PC Lunch PC Dinner  Glucose range:   252  141  Mean/median:       Self-care: The diet that the patient has been following is: tries to limit carbohydrates .     Meals: 3 meals per day. Breakfast is bagel and cream cheese           Exercise: walks in summer, some activity at work        Dietician visit, most recent: Unknown, has seen a dietitian a few times                Weight history: He has gained weight, lowest weight has previously been 155  Wt Readings from Last 3 Encounters:  08/19/14 175 lb 6.4 oz (79.561 kg)  08/18/14 175 lb 6.4 oz (79.561 kg)  06/11/14 175 lb 3.2 oz (79.47 kg)   Glycemic control:  A1c on 02/11/14 =8.9  Microalbumin/creatinine ratio in 11/15 was 11.2 Serum creatinine in 1/16 was 1.1    Lab Results  Component Value Date   HGBA1C 7.3* 06/11/2014   Lab Results  Component Value Date   CREATININE 0.90 10/24/2006         Medication List       This list is accurate as of: 08/19/14 10:24 AM.  Always use your most recent med list.               aspirin 81 MG tablet  Take 81 mg by mouth daily.     calcium carbonate 600 MG Tabs tablet  Commonly known as:  OS-CAL  Take 600 mg by mouth 2 (two) times daily with a meal.     citalopram 20 MG tablet  Commonly known as:  CELEXA  Take 20 mg by mouth daily.     insulin aspart 100 UNIT/ML injection  Commonly known as:  novoLOG  Use max 70 units per day with insulin pump     insulin detemir 100 UNIT/ML injection  Commonly known as:  LEVEMIR  Inject 58 Units into the skin at bedtime.     insulin lispro 100 UNIT/ML injection  Commonly known as:  HUMALOG  Use max 70 units per day with insulin pump     lisinopril 10 MG tablet  Commonly  known as:  PRINIVIL,ZESTRIL  Take 10 mg by mouth daily.     magnesium oxide 400 MG tablet  Commonly known as:  MAG-OX  Take 400 mg by mouth daily.     MELATONIN PO  Take 6 mg by mouth as needed.     mycophenolate 500 MG tablet  Commonly known as:  CELLCEPT  Take 1,500 mg by mouth 2 (two) times daily.     omeprazole 20 MG capsule  Commonly known as:  PRILOSEC  Take 20 mg by mouth daily.     rosuvastatin 10 MG tablet  Commonly known as:  CRESTOR  Take 10 mg by mouth daily.     tacrolimus 1 MG capsule  Commonly known as:  PROGRAF  Take 3 mg by mouth 2 (two) times daily.     V-GO 30 Kit  by Does not apply route.        Allergies:  Allergies  Allergen Reactions  . Metformin And Related     Can't tolerate    Past Medical History  Diagnosis Date  . Diabetes mellitus without complication   . Hypertension   . Cancer     prostate  . GERD (gastroesophageal reflux disease)   . Complication of anesthesia     Problems with waking up and with intubation  . Myocardial infarction     Past Surgical History  Procedure Laterality Date  . Heart transplant      Hx; of x 2  . Prostatectomy    . Colonoscopy    . Cardiac catheterization      Novemer 2014  . Appendectomy    . Esophagogastroduodenoscopy (egd) with propofol N/A 06/03/2013    Procedure: ESOPHAGOGASTRODUODENOSCOPY (EGD) WITH PROPOFOL;  Surgeon: Lear Ng, MD;  Location: [place];  Service: Endoscopy;  Laterality: N/A;  . Colonoscopy with propofol N/A 06/03/2013    Procedure: COLONOSCOPY WITH PROPOFOL;  Surgeon: Lear Ng, MD;  Location: [facility];  Service: Endoscopy;  Laterality: N/A;    Family History  Problem Relation Age of Onset  . Heart disease Mother   . Heart disease Father   . Diabetes Brother   .  Cancer Brother     Social History:  reports that he has quit smoking. He has never used smokeless tobacco. He reports that he drinks alcohol. He reports that he does not use  illicit drugs.    Review of Systems   Most recent eye exam was in 4/15        Lipids: On Crestor 2 years.  Results of lipids not available      No results found for: CHOL, HDL, LDLCALC, LDLDIRECT, TRIG, CHOLHDL              He is followed at [facility], is about 2 years since his second heart transplant and is reportedly doing well    Physical Examination:  BP 130/86 mmHg  Pulse 90  Temp(Src) 98.6 F (37 C)  Resp 16  Wt 175 lb 6.4 oz (79.561 kg)  SpO2 96%       ASSESSMENT:  Diabetes type 2 with BMI 29 and requiring insulin He is  Doing well with starting an insulin pump using the Omnipod  his blood sugars are mostly around 140 since last evening and lower overnight at 129   However he did not start the pump basal rate until 8 PM because of having Levemir tonight before   PLAN:   He will continue the same basal rate for now an review again in the morning   may need extra 1-2 units for higher fat meals   will discuss using the temporary basal for increased activity levels on his follow-up  Will review his blood sugars  In follow-up tomorrow   continue instructions on pump management  Patient Instructions  No change; call if glucose unusually hi or low      Loralai Eisman 08/19/2014, 10:24 AM   Note: This office note was prepared with Dragon voice recognition system technology. Any transcriptional errors that result from this process are unintentional.

## 2014-08-19 NOTE — Patient Instructions (Signed)
Test blood sugars before meals, 2hr after meals, bedtime and 3AM Read over manual

## 2014-08-20 ENCOUNTER — Ambulatory Visit (INDEPENDENT_AMBULATORY_CARE_PROVIDER_SITE_OTHER): Payer: 59 | Admitting: Endocrinology

## 2014-08-20 ENCOUNTER — Encounter: Payer: Medicare HMO | Admitting: Nutrition

## 2014-08-20 DIAGNOSIS — E119 Type 2 diabetes mellitus without complications: Secondary | ICD-10-CM

## 2014-08-20 NOTE — Progress Notes (Signed)
John [name] made changes to his basal rates and I/C ratio per Dr. Ronnie Derby order,  with very little assistance from me.   Changes made:  Basal rate: 1.1 from 7-9AM, and 3-6PM: 0.95.    I/C ratio: 1:7 for lunch (11AM-1PM).   Pt. Reports no discomfort wearing his pod last night or today.  He refused a review on how to change his pod, saying he had no questions.   Calorie [employer] book given for when he eats out.  We reviewed how to read this and what things to look for when determining the carb counts.  He reported good understanding of this.   We reviewed temp basals andextended boluses --when and how to do this, sick days, high blood sugar protocols.  He was given handouts for sick day and high blood sugar protocols and told to read them over, and call if questions. He had no final questions. He signed off on understanding all topics.  He will call if questions.

## 2014-08-20 NOTE — Progress Notes (Signed)
Patient ID: John [name], male   DOB: 1950-08-24, 64 y.o.   MRN: [id number]           Reason for Appointment: Follow-up for Type 2 Diabetes  Referring physician: Lupita Raider  History of Present Illness:          Diagnosis: Type 2 diabetes mellitus, date of diagnosis: 2006       Past history:  He thinks that diagnosis time his blood sugars are significantly higher on 400 and he was taking metformin for about 2 years Because of GI side effects with metformin he was put on insulin possibly because of significantly high readings.  However details of his previous management is not available Does not think he was tried on any other drugs or injectable medications before starting insulin He has been treated with Levemir and NovoLog for most of the duration of his diabetes  Recent history:   PUMP settings:  Basal rate 1.0 except 0.9 from 3 PM-6 PM   Carb ratio 1:8.  Correction 1:40 with target 90 -110    A1c in 11/15 was 8.9% and his blood sugars would be frequently in the 200 range with Levemir Novolog At that time was started on a V-go pump with a 30 unit basal and usually 8 units of NovoLog at mealtimes However because of difficulty with insurance coverage with this as well as somewhat inconsistent control and hypoglycemia he went back to Levemir/Novolog  He is now starting on an Omnipod insulin pump  On 08/18/14.  With starting the Omnipod pump his blood sugars have been excellent as described below His settings were not changed yesterday His patterns and management are as follows:  Fasting readings are mildly increased  Postprandial readings are good after evening meal and breakfast yesterday but higher after lunch fairly consistently because of eating out.  However he thinks he is generally eating low fat meals  His activity level is somewhat variable and does not cause low sugars  Blood sugar before supper are minimally increased  He is getting better counting carbohydrates  but did not add any extra for the higher fat meals yesterday at lunch  No further discomfort with using the pump and is able to change the Omnipod as per instructions yesterday        Oral hypoglycemic drugs the patient is taking are: None      Side effects from medications have been: Nausea from metformin  Compliance with the medical regimen: good Hypoglycemia:    Glucose monitoring:  recently up to 5 times a day     Glucometer:Freestyle Blood Glucose readings  PRE-MEAL Breakfast Lunch Dinner Bedtime Overall  Glucose range: 139, 146 129 136, 140    Mean/median:        POST-MEAL PC Breakfast PC Lunch PC Dinner  Glucose range: 129 181, 252 131, 141  Mean/median:        Self-care: The diet that the patient has been following is: tries to limit carbohydrates .     Meals: 3 meals per day. Breakfast is bagel and cream cheese           Exercise: walks in summer, some activity at work        Dietician visit, most recent: Unknown, has seen a dietitian a few times                Weight history: He has gained weight, lowest weight has previously been 155  Wt Readings from Last 3 Encounters:  08/19/14 175 lb 6.4 oz (79.561 kg)  08/18/14 175 lb 6.4 oz (79.561 kg)  06/11/14 175 lb 3.2 oz (79.47 kg)   Glycemic control:  A1c on 02/11/14 =8.9 Microalbumin/creatinine ratio in 11/15 was 11.2 Serum creatinine in 1/16 was 1.1    Lab Results  Component Value Date   HGBA1C 7.3* 06/11/2014   Lab Results  Component Value Date   CREATININE 0.90 10/24/2006         Medication List       This list is accurate as of: 08/20/14  1:48 PM.  Always use your most recent med list.               ACCU-CHEK FASTCLIX LANCETS Misc  Use to check blood sugar 6 times per day dx code E11.9     aspirin 81 MG tablet  Take 81 mg by mouth daily.     calcium carbonate 600 MG Tabs tablet  Commonly known as:  OS-CAL  Take 600 mg by mouth 2 (two) times daily with a meal.     citalopram 20 MG  tablet  Commonly known as:  CELEXA  Take 20 mg by mouth daily.     glucose blood test strip  Commonly known as:  FREESTYLE TEST STRIPS  Use as instructed to check blood sugar 6 times per day dx code E11.9     insulin aspart 100 UNIT/ML injection  Commonly known as:  novoLOG  Use max 70 units per day with insulin pump     insulin detemir 100 UNIT/ML injection  Commonly known as:  LEVEMIR  Inject 58 Units into the skin at bedtime.     insulin lispro 100 UNIT/ML injection  Commonly known as:  HUMALOG  Use max 70 units per day with insulin pump     lisinopril 10 MG tablet  Commonly known as:  PRINIVIL,ZESTRIL  Take 10 mg by mouth daily.     magnesium oxide 400 MG tablet  Commonly known as:  MAG-OX  Take 400 mg by mouth daily.     MELATONIN PO  Take 6 mg by mouth as needed.     mycophenolate 500 MG tablet  Commonly known as:  CELLCEPT  Take 1,500 mg by mouth 2 (two) times daily.     omeprazole 20 MG capsule  Commonly known as:  PRILOSEC  Take 20 mg by mouth daily.     rosuvastatin 10 MG tablet  Commonly known as:  CRESTOR  Take 10 mg by mouth daily.     tacrolimus 1 MG capsule  Commonly known as:  PROGRAF  Take 3 mg by mouth 2 (two) times daily.     V-GO 30 Kit  by Does not apply route.        Allergies:  Allergies  Allergen Reactions  . Metformin And Related     Can't tolerate    Past Medical History  Diagnosis Date  . Diabetes mellitus without complication   . Hypertension   . Cancer     prostate  . GERD (gastroesophageal reflux disease)   . Complication of anesthesia     Problems with waking up and with intubation  . Myocardial infarction     Past Surgical History  Procedure Laterality Date  . Heart transplant      Hx; of x 2  . Prostatectomy    . Colonoscopy    . Cardiac catheterization      Novemer 2014  . Appendectomy    . Esophagogastroduodenoscopy (egd)  with propofol N/A 06/03/2013    Procedure: ESOPHAGOGASTRODUODENOSCOPY (EGD) WITH  PROPOFOL;  Surgeon: Lear Ng, MD;  Location: [place];  Service: Endoscopy;  Laterality: N/A;  . Colonoscopy with propofol N/A 06/03/2013    Procedure: COLONOSCOPY WITH PROPOFOL;  Surgeon: Lear Ng, MD;  Location: [place];  Service: Endoscopy;  Laterality: N/A;    Family History  Problem Relation Age of Onset  . Heart disease Mother   . Heart disease Father   . Diabetes Brother   . Cancer Brother     Social History:  reports that he has quit smoking. He has never used smokeless tobacco. He reports that he drinks alcohol. He reports that he does not use illicit drugs.    Review of Systems   Most recent eye exam was in 4/15        Lipids: On Crestor 2 years.  Results of lipids not available      No results found for: CHOL, HDL, LDLCALC, LDLDIRECT, TRIG, CHOLHDL              He is followed at [facility] regularly, is about 2 years since his second heart transplant and is reportedly doing well    Physical Examination:  There were no vitals taken for this visit.       ASSESSMENT:  Diabetes type 2 with BMI 29 and requiring insulin He has done well with starting an insulin pump using the Omnipod earlier this week. As discussed in history of present illness his blood sugars are mostly near target now and fairly consistent before meals. He only tends to have some high readings after lunch despite a carbohydrate average ratio 1:8 Also no hypoglycemia  PLAN:   He will continue the same basal rateduring the day but increase it to 0.95 at 3 PM and also increase her basal rate between 7 AM-9 AM to 1.1  He will use a carbohydrate ratio of 1:7 at lunchtime and continue the same for breakfast and supper  He will need extra 1-2 units for higher fat meals.  Also discussed using extended boluses for higher fat meals  Will review his blood sugars on my chart next week    To be back In follow-up when he comes back to town next month    Patient Instructions    Change settings as discussed      [facility] 08/20/2014, 1:48 PM   Note: This office note was prepared with Dragon voice recognition system technology. Any transcriptional errors that result from this process are unintentional.

## 2014-08-20 NOTE — Patient Instructions (Signed)
Change settings as discussed

## 2014-08-20 NOTE — Patient Instructions (Signed)
Test blood sugars before meals and at bedtime.    

## 2014-08-20 NOTE — Patient Instructions (Signed)
Test blood sugars before and 2hr after meals, and at bedtime. Read over manual and bring questions for tomorrow's visit.

## 2014-09-23 ENCOUNTER — Ambulatory Visit (INDEPENDENT_AMBULATORY_CARE_PROVIDER_SITE_OTHER): Payer: 59 | Admitting: Endocrinology

## 2014-09-23 ENCOUNTER — Encounter: Payer: Self-pay | Admitting: Endocrinology

## 2014-09-23 VITALS — BP 130/78 | HR 94 | Temp 98.6°F | Resp 16 | Ht 65.0 in | Wt 179.0 lb

## 2014-09-23 DIAGNOSIS — E1165 Type 2 diabetes mellitus with hyperglycemia: Secondary | ICD-10-CM

## 2014-09-23 DIAGNOSIS — E785 Hyperlipidemia, unspecified: Secondary | ICD-10-CM | POA: Diagnosis not present

## 2014-09-23 DIAGNOSIS — IMO0002 Reserved for concepts with insufficient information to code with codable children: Secondary | ICD-10-CM

## 2014-09-23 NOTE — Patient Instructions (Signed)
Check  More sugars on waking upmand 2 hrs after any meal

## 2014-09-23 NOTE — Progress Notes (Signed)
Patient ID: John [name], male   DOB: 26-May-1950, 64 y.o.   MRN: [id number]           Reason for Appointment: Follow-up for Type 2 Diabetes  Referring physician: Mayra Neer  History of Present Illness:          Diagnosis: Type 2 diabetes mellitus, date of diagnosis: 2006       Past history:  He thinks that diagnosis time his blood sugars are significantly higher on 400 and he was taking metformin for about 2 years Because of GI side effects with metformin he was put on insulin possibly because of significantly high readings.  However details of his previous management is not available Does not think he was tried on any other drugs or injectable medications before starting insulin He has been treated with Levemir and NovoLog for most of the duration of his diabetes  Recent history:   PUMP settings:  Basal rate 1.0 until 7 AM, 7 AM-9 AM = 1.1, 9 AM = 1.0, 3 PM = 0.95 and 6 PM = 1.0  Carb ratio 1:8 breakfast and dinner and 1:7 for lunch.  Correction 1:40 with target 90 -110    A1c in 11/15 was 8.9% and his blood sugars would be frequently in the 200 range with Levemir Novolog However because of difficulty with insurance coverage of the V-go pump as well as somewhat inconsistent control and hypoglycemia he was started on the Omnipod insulin pump in 08/2014 He is being seen in short-term follow-up today  His patterns and management are as follows:  Fasting readings are still mildly increased although he has not checked them very consistently recently  Blood sugars are usually higher around midday also but some of these are after a late breakfast  Usually blood sugars are fairly good around supper time  Blood sugars after supper are variable  He has only a few postprandial readings and these are somewhat variable but not high usually  He has checked his blood sugars less often and on average about 2-1/2 times a day  He has not had any rigorous exercise but is doing some walking  at different times of the day except in the morning.  Does not get low with this        Oral hypoglycemic drugs the patient is taking are: None      Side effects from medications have been: Nausea from metformin  Compliance with the medical regimen: good Hypoglycemia: Yesterday late afternoon after overestimating his lunchtime bolus    Glucose monitoring:  recently up to 5 times a day     Glucometer:Freestyle Blood Glucose readings  Mean values apply above for all meters except median for One Touch  PRE-MEAL Fasting Lunch Dinner Bedtime Overall  Glucose range:  119-160  117  67-112    67-227   Mean/median:      133    POST-MEAL PC Breakfast PC Lunch PC Dinner  Glucose range:  136-227    99-200   Mean/median:      Self-care: The diet that the patient has been following is: tries to limit carbohydrates .     Meals: 3 meals per day. Breakfast is bagel and cream cheese           Exercise: walks in summer, some activity at work        Dietician visit, most recent: Unknown, has seen a dietitian a few times  Weight history: He has gained weight, lowest weight has previously been 155  Wt Readings from Last 3 Encounters:  09/23/14 179 lb (81.194 kg)  08/19/14 175 lb 6.4 oz (79.561 kg)  08/18/14 175 lb 6.4 oz (79.561 kg)   Glycemic control:  A1c on 02/11/14 =8.9 Microalbumin/creatinine ratio in 11/15 was 11.2 Serum creatinine in 1/16 was 1.1    Lab Results  Component Value Date   HGBA1C 7.3* 06/11/2014   Lab Results  Component Value Date   CREATININE 0.90 10/24/2006         Medication List       This list is accurate as of: 09/23/14  3:39 PM.  Always use your most recent med list.               ACCU-CHEK FASTCLIX LANCETS Misc  Use to check blood sugar 6 times per day dx code E11.9     aspirin 81 MG tablet  Take 81 mg by mouth daily.     calcium carbonate 600 MG Tabs tablet  Commonly known as:  OS-CAL  Take 600 mg by mouth 2 (two) times daily  with a meal.     citalopram 20 MG tablet  Commonly known as:  CELEXA  Take 20 mg by mouth daily.     glucose blood test strip  Commonly known as:  FREESTYLE TEST STRIPS  Use as instructed to check blood sugar 6 times per day dx code E11.9     insulin aspart 100 UNIT/ML injection  Commonly known as:  novoLOG  Use max 70 units per day with insulin pump     insulin lispro 100 UNIT/ML injection  Commonly known as:  HUMALOG  Use max 70 units per day with insulin pump     lisinopril 10 MG tablet  Commonly known as:  PRINIVIL,ZESTRIL  Take 10 mg by mouth daily.     magnesium oxide 400 MG tablet  Commonly known as:  MAG-OX  Take 400 mg by mouth daily.     MELATONIN PO  Take 6 mg by mouth as needed.     mycophenolate 500 MG tablet  Commonly known as:  CELLCEPT  Take 1,500 mg by mouth 2 (two) times daily.     omeprazole 20 MG capsule  Commonly known as:  PRILOSEC  Take 20 mg by mouth daily.     rosuvastatin 10 MG tablet  Commonly known as:  CRESTOR  Take 10 mg by mouth daily.     tacrolimus 1 MG capsule  Commonly known as:  PROGRAF  Take 3 mg by mouth 2 (two) times daily.     V-GO 30 Kit  by Does not apply route.        Allergies:  Allergies  Allergen Reactions  . Metformin And Related     Can't tolerate    Past Medical History  Diagnosis Date  . Diabetes mellitus without complication   . Hypertension   . Cancer     prostate  . GERD (gastroesophageal reflux disease)   . Complication of anesthesia     Problems with waking up and with intubation  . Myocardial infarction     Past Surgical History  Procedure Laterality Date  . Heart transplant      Hx; of x 2  . Prostatectomy    . Colonoscopy    . Cardiac catheterization      Novemer 2014  . Appendectomy    . Esophagogastroduodenoscopy (egd) with propofol N/A 06/03/2013  Procedure: ESOPHAGOGASTRODUODENOSCOPY (EGD) WITH PROPOFOL;  Surgeon: Lear Ng, MD;  Location: [place];  Service:  Endoscopy;  Laterality: N/A;  . Colonoscopy with propofol N/A 06/03/2013    Procedure: COLONOSCOPY WITH PROPOFOL;  Surgeon: Lear Ng, MD;  Location: [place];  Service: Endoscopy;  Laterality: N/A;    Family History  Problem Relation Age of Onset  . Heart disease Mother   . Heart disease Father   . Diabetes Brother   . Cancer Brother     Social History:  reports that he has quit smoking. He has never used smokeless tobacco. He reports that he drinks alcohol. He reports that he does not use illicit drugs.    Review of Systems   Most recent eye exam was in 4/15        Lipids: On Crestor 2 years.  Results of lipids from [facility] show excellent levels      No results found for: CHOL, HDL, LDLCALC, LDLDIRECT, TRIG, CHOLHDL              He is followed at [facility], is about 2 years since his second heart transplant and is reportedly doing well    Physical Examination:  BP 130/78 mmHg  Pulse 94  Temp(Src) 98.6 F (37 C) (Oral)  Resp 16  Ht $R'5\' 5"'kU$  (1.651 m)  Wt 179 lb (81.194 kg)  BMI 29.79 kg/m2  SpO2 97%   No edema    ASSESSMENT:  Diabetes type 2 with BMI 29 and requiring insulin He has done well with starting an insulin pump using the Omnipod in 5/16 As discussed in history of present illness his blood sugars are not being checked as frequently recently but overall blood sugars are very well controlled He still has a tendency to relatively high readings in the mornings although not consistently Blood sugars are somewhat better around supper time and variable after meals Most of his postprandial readings are reasonably good He has been relatively active and does not tend to get hypoglycemia with walking Overall compliant with his mealtime boluses and entering carbohydrates in his pump  PLAN:   He will continue the same basal rate except use a basal rate of 1.15 from 6 AM-9 AM  More frequent but was monitoring including on waking up and also 2 hours  after different meals to help assess mealtime bolus adequacy  Check A1c on the next visit  Have protein with each meal consistently  Discussed day-to-day management of diabetes and insulin pump  Patient Instructions  Check  More sugars on waking upmand 2 hrs after any meal    Counseling time on subjects discussed above is over 50% of today's 25 minute visit   Marinell Igarashi 09/23/2014, 3:39 PM   Note: This office note was prepared with Dragon voice recognition system technology. Any transcriptional errors that result from this process are unintentional.

## 2014-09-29 ENCOUNTER — Other Ambulatory Visit: Payer: Self-pay

## 2014-10-06 ENCOUNTER — Other Ambulatory Visit: Payer: Self-pay | Admitting: Endocrinology

## 2014-11-17 ENCOUNTER — Other Ambulatory Visit (INDEPENDENT_AMBULATORY_CARE_PROVIDER_SITE_OTHER): Payer: Medicare HMO

## 2014-11-17 ENCOUNTER — Other Ambulatory Visit: Payer: Medicare HMO

## 2014-11-17 DIAGNOSIS — E1165 Type 2 diabetes mellitus with hyperglycemia: Secondary | ICD-10-CM

## 2014-11-17 DIAGNOSIS — E119 Type 2 diabetes mellitus without complications: Secondary | ICD-10-CM | POA: Diagnosis not present

## 2014-11-17 DIAGNOSIS — IMO0002 Reserved for concepts with insufficient information to code with codable children: Secondary | ICD-10-CM

## 2014-11-17 LAB — MICROALBUMIN / CREATININE URINE RATIO
CREATININE, U: 215.6 mg/dL
MICROALB UR: 1.6 mg/dL (ref 0.0–1.9)
MICROALB/CREAT RATIO: 0.7 mg/g (ref 0.0–30.0)

## 2014-11-17 LAB — BASIC METABOLIC PANEL
BUN: 22 mg/dL (ref 6–23)
CHLORIDE: 101 meq/L (ref 96–112)
CO2: 28 mEq/L (ref 19–32)
Calcium: 9.3 mg/dL (ref 8.4–10.5)
Creatinine, Ser: 1.26 mg/dL (ref 0.40–1.50)
GFR: 61.62 mL/min (ref >60.00)
Glucose, Bld: 208 mg/dL — ABNORMAL HIGH (ref 70–99)
POTASSIUM: 4.4 meq/L (ref 3.5–5.1)
SODIUM: 137 meq/L (ref 135–145)

## 2014-11-17 LAB — HEMOGLOBIN A1C: Hgb A1c MFr Bld: 7.6 % — ABNORMAL HIGH (ref 4.6–6.5)

## 2014-11-20 ENCOUNTER — Encounter: Payer: Self-pay | Admitting: Endocrinology

## 2014-11-20 ENCOUNTER — Ambulatory Visit (INDEPENDENT_AMBULATORY_CARE_PROVIDER_SITE_OTHER): Payer: Medicare HMO | Admitting: Endocrinology

## 2014-11-20 VITALS — BP 142/88 | HR 87 | Temp 98.0°F | Resp 16 | Ht 65.0 in | Wt 174.8 lb

## 2014-11-20 DIAGNOSIS — E1165 Type 2 diabetes mellitus with hyperglycemia: Secondary | ICD-10-CM | POA: Diagnosis not present

## 2014-11-20 DIAGNOSIS — E785 Hyperlipidemia, unspecified: Secondary | ICD-10-CM | POA: Diagnosis not present

## 2014-11-20 DIAGNOSIS — IMO0002 Reserved for concepts with insufficient information to code with codable children: Secondary | ICD-10-CM

## 2014-11-20 NOTE — Progress Notes (Signed)
Patient ID: John [name], male   DOB: [birth year], 64 y.o.   MRN: [id number]           Reason for Appointment: Follow-up for Type 2 Diabetes  Referring physician: Mayra Neer  History of Present Illness:          Diagnosis: Type 2 diabetes mellitus, date of diagnosis: 2006       Past history:  He thinks that diagnosis time his blood sugars are significantly higher on 400 and he was taking metformin for about 2 years Because of GI side effects with metformin he was put on insulin possibly because of significantly high readings.  However details of his previous management is not available Does not think he was tried on any other drugs or injectable medications before starting insulin He has been treated with Levemir and NovoLog for most of the duration of his diabetes  A1c in 11/15 was 8.9%   Recent history:   PUMP settings:  Basal rate 1.0 until 6 AM, 6 AM-9 AM = 1.15, 9 AM = 1.0, 3 PM = 0.95 and 6 PM = 1.0  Carb ratio 1:8 breakfast and dinner and 1:7 for lunch.  Correction 1:40 with target 90 -110   However because of difficulty with insurance coverage of the V-go pump as well as somewhat inconsistent control and hypoglycemia he was started on the Omnipod insulin pump in 08/2014 Although his A1c had improved to 7.3 earlier this year it is now slightly higher at 7.6  His patterns and management are as follows:  Fasting readings are still mildly increased even though early morning basal rate was increased somewhat on the last visit  He does not check any blood sugars overnight and not clear if he has any high readings during the night  Blood sugars are relatively better around supper time but somewhat variable  Has inconsistent was prandial readings and these are not checked very often  He had a high reading last night of 255 with not taking a bolus for his inner of pizza last evening.  MEALTIME boluses: He appears to be missing boluses recently and frequently at lunchtime  He  thinks that overall his diet has been inconsistent especially recently because of having company at home  He has checked his blood sugars less often and on average about 2.5 times a day on average  He has lost some weight although he thinks he has regained some this month        Oral hypoglycemic drugs the patient is taking are: None      Side effects from medications have been: Nausea from metformin  Compliance with the medical regimen: good Hypoglycemia: None recently  Glucose monitoring:  as above      Glucometer:Freestyle Blood Glucose readings from pump download:   PRE-MEAL Fasting Lunch Dinner  10-11 PM  Overall  Glucose range: 135-247  118-156   101-148   114-171   101-255   Mean/median:  170      154    POST-MEAL PC Breakfast PC Lunch PC Dinner  Glucose range:    136-228   Mean/median:       Self-care: The diet that the patient has been following is: tries to limit carbohydrates .     Meals: 3 meals per day. Breakfast is bagel and cream cheese, usually eating a sandwich for lunch, only rarely eating pizza, sometimes having pasta with tomato sauce           Exercise: walks  in summer, some activity at work        Dietician visit, most recent: Unknown, has seen a dietitian a few times                Weight history: He has gained weight, lowest weight has previously been 155  Wt Readings from Last 3 Encounters:  11/20/14 174 lb 12.8 oz (79.289 kg)  09/23/14 179 lb (81.194 kg)  08/19/14 175 lb 6.4 oz (79.561 kg)   Glycemic control:  A1c on 02/11/14 =8.9 Microalbumin/creatinine ratio in 11/15 was 11.2 Serum creatinine in 1/16 was 1.1    Lab Results  Component Value Date   HGBA1C 7.6* 11/17/2014   HGBA1C 7.3* 06/11/2014   Lab Results  Component Value Date   MICROALBUR 1.6 11/17/2014   LDLCALC 69 02/17/2014   CREATININE 1.26 11/17/2014         Medication List       This list is accurate as of: 11/20/14  3:16 PM.  Always use your most recent med list.                ACCU-CHEK FASTCLIX LANCETS Misc  Use to check blood sugar 6 times per day dx code E11.9     aspirin 81 MG tablet  Take 81 mg by mouth daily.     calcium carbonate 600 MG Tabs tablet  Commonly known as:  OS-CAL  Take 600 mg by mouth 2 (two) times daily with a meal.     citalopram 20 MG tablet  Commonly known as:  CELEXA  Take 20 mg by mouth daily.     glucose blood test strip  Commonly known as:  FREESTYLE TEST STRIPS  Use as instructed to check blood sugar 6 times per day dx code E11.9     HUMALOG 100 UNIT/ML injection  Generic drug:  insulin lispro  Use max 70 units per day  with insulin pump     lisinopril 10 MG tablet  Commonly known as:  PRINIVIL,ZESTRIL  Take 10 mg by mouth daily.     magnesium oxide 400 MG tablet  Commonly known as:  MAG-OX  Take 400 mg by mouth daily.     MELATONIN PO  Take 6 mg by mouth as needed.     mycophenolate 500 MG tablet  Commonly known as:  CELLCEPT  Take 1,500 mg by mouth 2 (two) times daily.     omeprazole 20 MG capsule  Commonly known as:  PRILOSEC  Take 20 mg by mouth daily.     OMNIPOD Misc  Use every 3 days     rosuvastatin 10 MG tablet  Commonly known as:  CRESTOR  Take 10 mg by mouth daily.     tacrolimus 1 MG capsule  Commonly known as:  PROGRAF  Take 3 mg by mouth 2 (two) times daily.        Allergies:  Allergies  Allergen Reactions  . Metformin And Related     Can't tolerate    Past Medical History  Diagnosis Date  . Diabetes mellitus without complication   . Hypertension   . Cancer     prostate  . GERD (gastroesophageal reflux disease)   . Complication of anesthesia     Problems with waking up and with intubation  . Myocardial infarction     Past Surgical History  Procedure Laterality Date  . Heart transplant      Hx; of x 2  . Prostatectomy    . Colonoscopy    .  Cardiac catheterization      Novemer 2014  . Appendectomy    . Esophagogastroduodenoscopy (egd) with propofol N/A  06/03/2013    Procedure: ESOPHAGOGASTRODUODENOSCOPY (EGD) WITH PROPOFOL;  Surgeon: Lear Ng, MD;  Location: [place];  Service: Endoscopy;  Laterality: N/A;  . Colonoscopy with propofol N/A 06/03/2013    Procedure: COLONOSCOPY WITH PROPOFOL;  Surgeon: Lear Ng, MD;  Location: [place];  Service: Endoscopy;  Laterality: N/A;    Family History  Problem Relation Age of Onset  . Heart disease Mother   . Heart disease Father   . Diabetes Brother   . Cancer Brother     Social History:  reports that he has quit smoking. He has never used smokeless tobacco. He reports that he drinks alcohol. He reports that he does not use illicit drugs.    Review of Systems   Most recent eye exam was in 4/15        Lipids: On Crestor 2 years.  Results of lipids from [facility] show excellent levels       Lab Results  Component Value Date   HDL 29* 02/17/2014   LDLCALC 69 02/17/2014   TRIG 195* 02/17/2014                He is followed at [facility], is about 2 years since his second heart transplant and is reportedly doing well    Physical Examination:  BP 142/88 mmHg  Pulse 87  Temp(Src) 98 F (36.7 C)  Resp 16  Ht 5\' 5"  (1.651 m)  Wt 174 lb 12.8 oz (79.289 kg)  BMI 29.09 kg/m2  SpO2 94%   No edema    ASSESSMENT:  Diabetes type 2 with BMI 29 and requiring insulin He has done well with starting an insulin pump using the Omnipod in 5/16 However his control is somewhat suboptimal and his compliance has not been as good this summer See history of present illness for detailed discussion of his current management, blood sugar patterns and problems identified  He has tendency to consistently high fasting readings although not as much in the last few days Not clear if his sugars are higher during the night also Blood sugars are variable in the evenings and some of the high reading may be related to missing boluses at lunchtime Also has periodic times when he misses  his boluses in the evening also He can also do better with glucose monitoring including after meals and after high readings  PLAN:   He will increase his basal rate from 5 AM up to 1.25, continue this until 10 AM  Call if blood sugars are consistently high  More consistent bolusing as discussed  Have protein with each meal consistently  Discussed day-to-day management of diabetes and insulin pump  Patient Instructions  5am-10 am 1.25  Bolus for all meals and snacks   Check more sugars 2 hrs after various meals or after hi sugars, some at 3 am  Protein at all meals    Counseling time on subjects discussed above is over 50% of today's 25 minute visit   Keandra Medero 11/20/2014, 3:16 PM   Note: This office note was prepared with Dragon voice recognition system technology. Any transcriptional errors that result from this process are unintentional.

## 2014-11-20 NOTE — Patient Instructions (Addendum)
5am-10 am 1.25  Bolus for all meals and snacks   Check more sugars 2 hrs after various meals or after hi sugars, some at 3 am  Protein at all meals

## 2014-12-26 ENCOUNTER — Ambulatory Visit
Admission: RE | Admit: 2014-12-26 | Discharge: 2014-12-26 | Disposition: A | Discharge status: Home / Self Care | Payer: 59 | Source: Ambulatory Visit | Attending: Physician Assistant | Admitting: Physician Assistant

## 2014-12-26 ENCOUNTER — Other Ambulatory Visit: Payer: Self-pay | Admitting: Physician Assistant

## 2014-12-26 DIAGNOSIS — T1490XA Injury, unspecified, initial encounter: Secondary | ICD-10-CM

## 2015-01-16 DIAGNOSIS — D224 Melanocytic nevi of scalp and neck: Secondary | ICD-10-CM | POA: Diagnosis not present

## 2015-01-16 DIAGNOSIS — D235 Other benign neoplasm of skin of trunk: Secondary | ICD-10-CM | POA: Diagnosis not present

## 2015-01-16 DIAGNOSIS — L820 Inflamed seborrheic keratosis: Secondary | ICD-10-CM | POA: Diagnosis not present

## 2015-01-16 DIAGNOSIS — D1801 Hemangioma of skin and subcutaneous tissue: Secondary | ICD-10-CM | POA: Diagnosis not present

## 2015-01-16 DIAGNOSIS — D692 Other nonthrombocytopenic purpura: Secondary | ICD-10-CM | POA: Diagnosis not present

## 2015-01-16 DIAGNOSIS — L821 Other seborrheic keratosis: Secondary | ICD-10-CM | POA: Diagnosis not present

## 2015-01-16 DIAGNOSIS — L570 Actinic keratosis: Secondary | ICD-10-CM | POA: Diagnosis not present

## 2015-02-17 ENCOUNTER — Other Ambulatory Visit (INDEPENDENT_AMBULATORY_CARE_PROVIDER_SITE_OTHER): Payer: 59

## 2015-02-17 ENCOUNTER — Other Ambulatory Visit: Payer: Medicare HMO

## 2015-02-17 DIAGNOSIS — E1165 Type 2 diabetes mellitus with hyperglycemia: Secondary | ICD-10-CM | POA: Diagnosis not present

## 2015-02-17 DIAGNOSIS — I10 Essential (primary) hypertension: Secondary | ICD-10-CM

## 2015-02-17 DIAGNOSIS — IMO0002 Reserved for concepts with insufficient information to code with codable children: Secondary | ICD-10-CM

## 2015-02-17 LAB — LIPID PANEL
CHOL/HDL RATIO: 5
CHOLESTEROL: 157 mg/dL (ref 0–200)
HDL: 28.9 mg/dL — AB (ref >39.00)
NonHDL: 127.95
Triglycerides: 306 mg/dL — ABNORMAL HIGH (ref 0.0–149.0)
VLDL: 61.2 mg/dL — AB (ref 0.0–40.0)

## 2015-02-17 LAB — BASIC METABOLIC PANEL
BUN: 21 mg/dL (ref 6–23)
CHLORIDE: 102 meq/L (ref 96–112)
CO2: 29 mEq/L (ref 19–32)
CREATININE: 1.09 mg/dL (ref 0.40–1.50)
Calcium: 9.2 mg/dL (ref 8.4–10.5)
GFR: 72.78 mL/min (ref >60.00)
GLUCOSE: 215 mg/dL — AB (ref 70–99)
Potassium: 4.3 mEq/L (ref 3.5–5.1)
Sodium: 136 mEq/L (ref 135–145)

## 2015-02-17 LAB — LDL CHOLESTEROL, DIRECT: Direct LDL: 70 mg/dL

## 2015-02-18 ENCOUNTER — Other Ambulatory Visit: Payer: Medicare HMO

## 2015-02-20 ENCOUNTER — Encounter: Payer: Self-pay | Admitting: Endocrinology

## 2015-02-20 ENCOUNTER — Ambulatory Visit (INDEPENDENT_AMBULATORY_CARE_PROVIDER_SITE_OTHER): Payer: 59 | Admitting: Endocrinology

## 2015-02-20 VITALS — BP 142/86 | HR 89 | Temp 98.2°F | Ht 65.0 in | Wt 177.0 lb

## 2015-02-20 DIAGNOSIS — E1165 Type 2 diabetes mellitus with hyperglycemia: Secondary | ICD-10-CM | POA: Diagnosis not present

## 2015-02-20 DIAGNOSIS — I10 Essential (primary) hypertension: Secondary | ICD-10-CM

## 2015-02-20 DIAGNOSIS — Z794 Long term (current) use of insulin: Secondary | ICD-10-CM

## 2015-02-20 LAB — POCT GLYCOSYLATED HEMOGLOBIN (HGB A1C): HEMOGLOBIN A1C: 7.8

## 2015-02-20 MED ORDER — CANAGLIFLOZIN 100 MG PO TABS: ORAL_TABLET | ORAL | Status: DC

## 2015-02-20 NOTE — Patient Instructions (Signed)
Must bolus for all meals and snacks  Take Invokana in am   Watch BP   Check blood sugars on waking up   times a week Also check blood sugars about 2 hours after a meal and do this after different meals by rotation  Recommended blood sugar levels on waking up is 90-130 and about 2 hours after meal is 130-160  Please bring your blood sugar monitor to each visit, thank you

## 2015-02-20 NOTE — Progress Notes (Signed)
Patient ID: John [name], male   DOB: 04/10/1950, 64 y.o.   MRN: [id number]           Reason for Appointment: Follow-up for Type 2 Diabetes  Referring physician: Mayra Neer  History of Present Illness:          Diagnosis: Type 2 diabetes mellitus, date of diagnosis: 2006       Past history:  He thinks that diagnosis time his blood sugars are significantly higher on 400 and he was taking metformin for about 2 years Because of GI side effects with metformin he was put on insulin possibly because of significantly high readings.  However details of his previous management is not available Does not think he was tried on any other drugs or injectable medications before starting insulin He has been treated with Levemir and NovoLog for most of the duration of his diabetes  A1c in 11/15 was 8.9%   Recent history:   PUMP settings:  Basal rate 1.0 until 5 AM, 5 AM- 10 AM = 1.25, 10 AM = 1.0, 3 PM = 0.95 and 6 PM = 1.0   Carb ratio 1:8 breakfast and dinner and 1:7 for lunch.  Correction 1:40 with target 90 -110   Because of difficulty with insurance coverage of the V-go pump as well as somewhat inconsistent control and hypoglycemia he was started on the Omnipod insulin pump in 08/2014 Although his A1c had improved to 7.3 earlier this year it is now progressively higher at 7.8  His patterns and management are as follows:  Fasting readings are still mildly increased although better since his early morning basal it was increased on the last visit to 1.25  His lab glucose late morning was also high at 215  His blood sugars are variable at suppertime but relatively higher now compared to the last visit  Frequently will not bolus for lunch as he does not have his PBM with him, this was discussed on his previous visit  No hypoglycemia even though he thinks he is fairly active  He has difficulty losing weight        Oral hypoglycemic drugs the patient is taking are: None      Side effects  from medications have been: Nausea from metformin  Compliance with the medical regimen: good Hypoglycemia: None recently  Glucose monitoring:  as above      Glucometer:Freestyle Blood Glucose readings from pump download:  Mean values apply above for all meters except median for One Touch  PRE-MEAL Fasting Lunch Dinner Bedtime Overall  Glucose range:  121-195   183   109-216   113, 125   105-241   Mean/median:  143      156    Self-care: The diet that the patient has been following is: tries to limit carbohydrates .     Meals: 3 meals per day. Breakfast is bagel and cream cheese, usually eating a sandwich for lunch, only rarely eating pizza, sometimes having pasta with tomato sauce            Exercise: walks in summer, some activity at work        Dietician visit, most recent: Unknown, has seen a dietitian a few times                Weight history: He has gained weight, lowest weight has previously been 155  Wt Readings from Last 3 Encounters:  02/20/15 177 lb (80.287 kg)  11/20/14 174 lb 12.8 oz (79.289  kg)  09/23/14 179 lb (81.194 kg)   Glycemic control:  A1c on 02/11/14 =8.9 Microalbumin/creatinine ratio in 11/15 was 11.2 Serum creatinine in 1/16 was 1.1    Lab Results  Component Value Date   HGBA1C 7.8 02/20/2015   HGBA1C 7.6* 11/17/2014   HGBA1C 7.3* 06/11/2014   Lab Results  Component Value Date   MICROALBUR 1.6 11/17/2014   LDLCALC 69 02/17/2014   CREATININE 1.09 02/17/2015         Medication List       This list is accurate as of: 02/20/15  4:37 PM.  Always use your most recent med list.               ACCU-CHEK FASTCLIX LANCETS Misc  Use to check blood sugar 6 times per day dx code E11.9     aspirin 81 MG tablet  Take 81 mg by mouth daily.     calcium carbonate 600 MG Tabs tablet  Commonly known as:  OS-CAL  Take 600 mg by mouth 2 (two) times daily with a meal.     canagliflozin 100 MG Tabs tablet  Commonly known as:  INVOKANA  1 tablet  before breakfast     citalopram 20 MG tablet  Commonly known as:  CELEXA  Take 20 mg by mouth daily.     glucose blood test strip  Commonly known as:  FREESTYLE TEST STRIPS  Use as instructed to check blood sugar 6 times per day dx code E11.9     HUMALOG 100 UNIT/ML injection  Generic drug:  insulin lispro  Use max 70 units per day  with insulin pump     lisinopril 10 MG tablet  Commonly known as:  PRINIVIL,ZESTRIL  Take 10 mg by mouth daily.     magnesium oxide 400 MG tablet  Commonly known as:  MAG-OX  Take 400 mg by mouth daily.     MELATONIN PO  Take 6 mg by mouth as needed.     mycophenolate 500 MG tablet  Commonly known as:  CELLCEPT  Take 1,500 mg by mouth 2 (two) times daily.     omeprazole 20 MG capsule  Commonly known as:  PRILOSEC  Take 20 mg by mouth daily.     OMNIPOD Misc  Use every 3 days     rosuvastatin 10 MG tablet  Commonly known as:  CRESTOR  Take 10 mg by mouth daily.     tacrolimus 1 MG capsule  Commonly known as:  PROGRAF  Take 3 mg by mouth 2 (two) times daily.        Allergies:  Allergies  Allergen Reactions  . Metformin And Related     Can't tolerate    Past Medical History  Diagnosis Date  . Diabetes mellitus without complication ([place])   . Hypertension   . Cancer [facility])     prostate  . GERD (gastroesophageal reflux disease)   . Complication of anesthesia     Problems with waking up and with intubation  . Myocardial infarction [facility])     Past Surgical History  Procedure Laterality Date  . Heart transplant      Hx; of x 2  . Prostatectomy    . Colonoscopy    . Cardiac catheterization      Novemer 2014  . Appendectomy    . Esophagogastroduodenoscopy (egd) with propofol N/A 06/03/2013    Procedure: ESOPHAGOGASTRODUODENOSCOPY (EGD) WITH PROPOFOL;  Surgeon: Lear Ng, MD;  Location: [place];  Service:  Endoscopy;  Laterality: N/A;  . Colonoscopy with propofol N/A 06/03/2013    Procedure: COLONOSCOPY WITH  PROPOFOL;  Surgeon: Lear Ng, MD;  Location: [place];  Service: Endoscopy;  Laterality: N/A;    Family History  Problem Relation Age of Onset  . Heart disease Mother   . Heart disease Father   . Diabetes Brother   . Cancer Brother     Social History:  reports that he has quit smoking. He has never used smokeless tobacco. He reports that he drinks alcohol. He reports that he does not use illicit drugs.    Review of Systems   Most recent eye exam was in 4/15        Lipids: On Crestor 2 years.  Results of lipids from [facility] show excellent levels       Lab Results  Component Value Date   CHOL 157 02/17/2015   HDL 28.90* 02/17/2015   LDLCALC 69 02/17/2014   LDLDIRECT 70.0 02/17/2015   TRIG 306.0* 02/17/2015   CHOLHDL 5 02/17/2015               He is followed at [facility], is about 2 years since his second heart transplant and is reportedly doing well   Physical Examination:  BP 142/86 mmHg  Pulse 89  Temp(Src) 98.2 F (36.8 C)  Ht 5\' 5"  (1.651 m)  Wt 177 lb (80.287 kg)  BMI 29.45 kg/m2  SpO2 97%   No edema    ASSESSMENT:  Diabetes type 2 with BMI 29 and requiring insulin He has done well with starting an insulin pump using the Omnipod in 5/16 However his control is suboptimal partly because of not being compliant with his boluses and glucose monitoring as much as he needs to He also has difficulty losing weight despite claiming to be very active See history of present illness for detailed discussion of his current management, blood sugar patterns and problems identified  His A1c being 7.8 indicates that he probably has high readings after lunch and periodically has high readings in the mornings also He also appears to have a dawn phenomenon  PLAN:   He will try to take his meter with him consistently when out at lunchtime and bolus for his meal even if he does not check his sugar  More readings after meals especially lunch and supper  No  change in insulin as yet Trial of Invokana for 30 days.  Discussed benefits of this medication , Discussed action of SGLT 2 drugs on lowering glucose by decreasing kidney absorption of glucose, benefits of weight loss and lower blood pressure, possible side effects including candidiasis and dosage regimen   Have protein with each meal consistently  Discussed day-to-day management of diabetes and insulin pump  Patient Instructions  Must bolus for all meals and snacks  Take Invokana in am   Watch BP   Check blood sugars on waking up   times a week Also check blood sugars about 2 hours after a meal and do this after different meals by rotation  Recommended blood sugar levels on waking up is 90-130 and about 2 hours after meal is 130-160  Please bring your blood sugar monitor to each visit, thank you     Counseling time on subjects discussed above is over 50% of today's 25 minute visit   Jolette Lana 02/20/2015, 4:37 PM   Note: This office note was prepared with Dragon voice recognition system technology. Any transcriptional errors that result from this  process are unintentional.

## 2015-03-09 DIAGNOSIS — E860 Dehydration: Secondary | ICD-10-CM | POA: Diagnosis not present

## 2015-03-10 DIAGNOSIS — E860 Dehydration: Secondary | ICD-10-CM | POA: Diagnosis not present

## 2015-03-11 DIAGNOSIS — E860 Dehydration: Secondary | ICD-10-CM | POA: Diagnosis not present

## 2015-03-12 DIAGNOSIS — E860 Dehydration: Secondary | ICD-10-CM | POA: Diagnosis not present

## 2015-03-20 ENCOUNTER — Ambulatory Visit: Payer: 59 | Admitting: Endocrinology

## 2015-04-28 ENCOUNTER — Other Ambulatory Visit: Payer: Self-pay | Admitting: Endocrinology

## 2015-05-06 ENCOUNTER — Ambulatory Visit
Admission: RE | Admit: 2015-05-06 | Discharge: 2015-05-06 | Disposition: A | Discharge status: Home / Self Care | Payer: 59 | Source: Ambulatory Visit | Attending: Family Medicine | Admitting: Family Medicine

## 2015-05-06 ENCOUNTER — Other Ambulatory Visit: Payer: Self-pay | Admitting: Family Medicine

## 2015-05-06 DIAGNOSIS — M542 Cervicalgia: Secondary | ICD-10-CM

## 2015-05-06 DIAGNOSIS — M47812 Spondylosis without myelopathy or radiculopathy, cervical region: Secondary | ICD-10-CM | POA: Diagnosis not present

## 2015-05-12 DIAGNOSIS — Z9079 Acquired absence of other genital organ(s): Secondary | ICD-10-CM | POA: Diagnosis not present

## 2015-05-12 DIAGNOSIS — I10 Essential (primary) hypertension: Secondary | ICD-10-CM | POA: Diagnosis not present

## 2015-05-12 DIAGNOSIS — E119 Type 2 diabetes mellitus without complications: Secondary | ICD-10-CM | POA: Diagnosis not present

## 2015-05-12 DIAGNOSIS — Z4821 Encounter for aftercare following heart transplant: Secondary | ICD-10-CM | POA: Diagnosis not present

## 2015-05-12 DIAGNOSIS — Z7982 Long term (current) use of aspirin: Secondary | ICD-10-CM | POA: Diagnosis not present

## 2015-05-12 DIAGNOSIS — Z48298 Encounter for aftercare following other organ transplant: Secondary | ICD-10-CM | POA: Diagnosis not present

## 2015-05-12 DIAGNOSIS — Z125 Encounter for screening for malignant neoplasm of prostate: Secondary | ICD-10-CM | POA: Diagnosis not present

## 2015-05-12 DIAGNOSIS — Z79899 Other long term (current) drug therapy: Secondary | ICD-10-CM | POA: Diagnosis not present

## 2015-05-12 DIAGNOSIS — Z8546 Personal history of malignant neoplasm of prostate: Secondary | ICD-10-CM | POA: Diagnosis not present

## 2015-05-12 DIAGNOSIS — Z941 Heart transplant status: Secondary | ICD-10-CM | POA: Diagnosis not present

## 2015-05-12 DIAGNOSIS — Z01810 Encounter for preprocedural cardiovascular examination: Secondary | ICD-10-CM | POA: Diagnosis not present

## 2015-05-12 DIAGNOSIS — Z794 Long term (current) use of insulin: Secondary | ICD-10-CM | POA: Diagnosis not present

## 2015-05-13 ENCOUNTER — Other Ambulatory Visit: Payer: Self-pay | Admitting: Family Medicine

## 2015-05-13 ENCOUNTER — Other Ambulatory Visit (HOSPITAL_COMMUNITY): Payer: Self-pay | Admitting: Family Medicine

## 2015-05-13 DIAGNOSIS — H532 Diplopia: Secondary | ICD-10-CM

## 2015-05-13 DIAGNOSIS — H5051 Esophoria: Secondary | ICD-10-CM

## 2015-05-14 ENCOUNTER — Ambulatory Visit
Admission: RE | Admit: 2015-05-14 | Discharge: 2015-05-14 | Disposition: A | Discharge status: Home / Self Care | Payer: 59 | Source: Ambulatory Visit | Attending: Family Medicine | Admitting: Family Medicine

## 2015-05-14 DIAGNOSIS — I619 Nontraumatic intracerebral hemorrhage, unspecified: Secondary | ICD-10-CM | POA: Diagnosis not present

## 2015-05-14 DIAGNOSIS — M542 Cervicalgia: Secondary | ICD-10-CM | POA: Diagnosis not present

## 2015-05-14 DIAGNOSIS — H5051 Esophoria: Secondary | ICD-10-CM

## 2015-05-14 DIAGNOSIS — H532 Diplopia: Secondary | ICD-10-CM

## 2015-05-14 MED ORDER — GADOBENATE DIMEGLUMINE 529 MG/ML IV SOLN
15.0000 mL | Freq: Once | INTRAVENOUS | Status: AC | PRN
  Administered 2015-05-14: 15 mL via INTRAVENOUS

## 2015-05-16 DIAGNOSIS — Z0100 Encounter for examination of eyes and vision without abnormal findings: Secondary | ICD-10-CM | POA: Diagnosis not present

## 2015-06-12 ENCOUNTER — Encounter: Payer: Self-pay | Admitting: Neurology

## 2015-06-12 ENCOUNTER — Ambulatory Visit (INDEPENDENT_AMBULATORY_CARE_PROVIDER_SITE_OTHER): Payer: 59 | Admitting: Neurology

## 2015-06-12 VITALS — BP 132/74 | HR 92 | Ht 66.0 in | Wt 168.0 lb

## 2015-06-12 DIAGNOSIS — R51 Headache: Secondary | ICD-10-CM | POA: Diagnosis not present

## 2015-06-12 DIAGNOSIS — H4941 Progressive external ophthalmoplegia, right eye: Secondary | ICD-10-CM | POA: Diagnosis not present

## 2015-06-12 DIAGNOSIS — R519 Headache, unspecified: Secondary | ICD-10-CM

## 2015-06-12 NOTE — Addendum Note (Signed)
Addended by: Gerda Diss A on: 06/12/2015 10:24 AM   Modules accepted: Orders

## 2015-06-12 NOTE — Progress Notes (Signed)
Chart forwarded.  

## 2015-06-12 NOTE — Progress Notes (Addendum)
NEUROLOGY CONSULTATION NOTE  John [name] MRN: [id number] DOB: 29-Aug-1950  Referring provider: Dr. Brigitte Pulse Primary care provider: Dr. Brigitte Pulse  Reason for consult:  Vision changes  HISTORY OF PRESENT ILLNESS: John [name] is a 65 year old left-handed male with type 2 diabetes, hyperlipidemia, hypertension, type 2 diabetes, depression and status post heart transplant (on CellCept and Prograf) who presents for vision changes.  History obtained by patient, his wife and PCP note.  Labs, and imaging of brain and neck MRI reviewed.  About 2 months ago, early January, he developed sudden onset horizontal double vision.  It resolved when closing one eye.  It lasted about 2 weeks and resolved.  At that time, he developed a dull ache in the right parietal region, as well as sensation of pressure behind his right eye.  After the double vision resolved, he saw Dr. Clydene Laming, an optometrist, who told him that he had "pressure behind my right eye".  He continues to have the dull headache and eye pressure.  He also feels a little off-balance but denied facial numbness, slurred speech, or focal numbness or weakness.  MRI of brain with and without contrast from 05/14/15 showed scattered chronic subcortical microhemorrhages, as well as partially empty sella and ectasia of optic nerve root sleeves.  MRI of neck showed mild cervical spine degeneration but overall unremarkable.  CBC and CMP are unremarkable.  PAST MEDICAL HISTORY: Past Medical History  Diagnosis Date  . Diabetes mellitus without complication ([place])   . Hypertension   . Cancer [facility])     prostate  . GERD (gastroesophageal reflux disease)   . Complication of anesthesia     Problems with waking up and with intubation  . Myocardial infarction [facility])     PAST SURGICAL HISTORY: Past Surgical History  Procedure Laterality Date  . Heart transplant      Hx; of x 2  . Prostatectomy    . Colonoscopy    . Cardiac catheterization      Novemer 2014  .  Appendectomy    . Esophagogastroduodenoscopy (egd) with propofol N/A 06/03/2013    Procedure: ESOPHAGOGASTRODUODENOSCOPY (EGD) WITH PROPOFOL;  Surgeon: Lear Ng, MD;  Location: [place];  Service: Endoscopy;  Laterality: N/A;  . Colonoscopy with propofol N/A 06/03/2013    Procedure: COLONOSCOPY WITH PROPOFOL;  Surgeon: Lear Ng, MD;  Location: [place];  Service: Endoscopy;  Laterality: N/A;    MEDICATIONS: Current Outpatient Prescriptions on File Prior to Visit  Medication Sig Dispense Refill  . ACCU-CHEK FASTCLIX LANCETS MISC Use to check blood sugar 6 times per day dx code E11.9 204 each 3  . aspirin 81 MG tablet Take 81 mg by mouth daily.    . calcium carbonate (OS-CAL) 600 MG TABS tablet Take 600 mg by mouth 2 (two) times daily with a meal.    . canagliflozin (INVOKANA) 100 MG TABS tablet 1 tablet before breakfast 30 tablet 3  . citalopram (CELEXA) 20 MG tablet Take 20 mg by mouth daily.    Marland Kitchen glucose blood (FREESTYLE TEST STRIPS) test strip Use as instructed to check blood sugar 6 times per day dx code E11.9 200 each 3  . HUMALOG 100 UNIT/ML injection Use max 70 units per day  with insulin pump 70 mL 0  . Insulin Disposable Pump (OMNIPOD) MISC Use every 3 days 30 each 3  . lisinopril (PRINIVIL,ZESTRIL) 10 MG tablet Take 10 mg by mouth daily.    . magnesium oxide (MAG-OX) 400 MG tablet  Take 400 mg by mouth daily.    Marland Kitchen MELATONIN PO Take 6 mg by mouth as needed.    . mycophenolate (CELLCEPT) 500 MG tablet Take 1,500 mg by mouth 2 (two) times daily.    Marland Kitchen omeprazole (PRILOSEC) 20 MG capsule Take 20 mg by mouth daily.    . rosuvastatin (CRESTOR) 10 MG tablet Take 10 mg by mouth daily.    . tacrolimus (PROGRAF) 1 MG capsule Take 3 mg by mouth 2 (two) times daily.     No current facility-administered medications on file prior to visit.    ALLERGIES: Allergies  Allergen Reactions  . Metformin And Related     Can't tolerate    FAMILY HISTORY: Family History    Problem Relation Age of Onset  . Heart disease Mother   . Heart disease Father   . Diabetes Brother   . Cancer Brother     SOCIAL HISTORY: Social History   Social History  . Marital Status: Married    Spouse Name: N/A  . Number of Children: N/A  . Years of Education: N/A   Occupational History  . Not on file.   Social History Main Topics  . Smoking status: Former Research scientist (life sciences)  . Smokeless tobacco: Never Used     Comment: quit smoking cigarettes in 1998  . Alcohol Use: Yes     Comment: rare  . Drug Use: No  . Sexual Activity: Not on file   Other Topics Concern  . Not on file   Social History Narrative    REVIEW OF SYSTEMS: Constitutional: No fevers, chills, or sweats, no generalized fatigue, change in appetite Eyes: No visual changes, double vision, eye pain Ear, nose and throat: No hearing loss, ear pain, nasal congestion, sore throat Cardiovascular: No chest pain, palpitations Respiratory:  No shortness of breath at rest or with exertion, wheezes GastrointestinaI: No nausea, vomiting, diarrhea, abdominal pain, fecal incontinence Genitourinary:  No dysuria, urinary retention or frequency Musculoskeletal:  No neck pain, back pain Integumentary: No rash, pruritus, skin lesions Neurological: as above Psychiatric: No depression, insomnia, anxiety Endocrine: No palpitations, fatigue, diaphoresis, mood swings, change in appetite, change in weight, increased thirst Hematologic/Lymphatic:  No anemia, purpura, petechiae. Allergic/Immunologic: no itchy/runny eyes, nasal congestion, recent allergic reactions, rashes  PHYSICAL EXAM: Filed Vitals:   06/12/15 0857  BP: 132/74  Pulse: 92   General: No acute distress.  Patient appears well-groomed.  Head:  Normocephalic/atraumatic Eyes:  fundi examined but not visualized Neck: supple, no paraspinal tenderness, full range of motion Back: No paraspinal tenderness Heart: regular rate and rhythm Lungs: Clear to auscultation  bilaterally. Vascular: No carotid bruits. Neurological Exam: Mental status: alert and oriented to person, place, and time, recent and remote memory intact, fund of knowledge intact, attention and concentration intact, speech fluent and not dysarthric, language intact. Cranial nerves: CN I: not tested CN II: pupils equal, round and reactive to light, visual fields intact CN III, IV, VI:  full range of motion, no nystagmus, no ptosis CN V: facial sensation intact CN VII: upper and lower face symmetric CN VIII: hearing intact CN IX, X: gag intact, uvula midline CN XI: sternocleidomastoid and trapezius muscles intact CN XII: tongue midline Bulk & Tone: normal, no fasciculations. Motor:  5/5 throughout  Sensation: temperature and vibration sensation intact. Deep Tendon Reflexes:  2+ throughout, toes downgoing.  Finger to nose testing:  Without dysmetria.  Heel to shin:  Without dysmetria.  Gait:  Normal station and stride.  Able to turn  and tandem walk. Romberg negative.  IMPRESSION: Horizontal diplopia New onset headache If he did have papilledema, consider idiopathic intracranial hypertension, however he is an unusual age and gender for such a condition.  Otherwise, it is possible he could have had a small pontine infarct that was not seen on MRI  PLAN: 1.  Will obtain notes from optometrist 2.  Would set up for lumbar puncture to check opening pressure, cell count, protein and glucose 3.  Further recommendations pending results 4.  Follow up ADDENDUM:  Reviewed notes from optometrist.  He did not have papilledema.  I would forego LP at this time.  I would like him evaluated by ophthalmology with further recommendations pending.  May need to treat for possible stroke.  Thank you for allowing me to take part in the care of this patient.  Metta Clines, DO  CC:  Mayra Neer, MD

## 2015-06-12 NOTE — Patient Instructions (Signed)
1.  Will start with a lumbar puncture, checking opening pressure, cell count, protein and glucose 2.  Further recommendations pending results

## 2015-06-29 ENCOUNTER — Telehealth: Payer: Self-pay | Admitting: Neurology

## 2015-06-29 NOTE — Telephone Encounter (Signed)
Pt is having difficulty w/ Dr. Kathleen Argue office telling him they are in [place], and his insurance saying they are not. Advised pt to get what Dr. Kathleen Argue office was telling him in writing if he decided to keep that appointment. Asked if he would like me to send referral elsewhere, but denied. Just wanted to know if any of our other pt's had had that issue w/ Dr. Kathlen Mody.

## 2015-06-29 NOTE — Telephone Encounter (Signed)
Pt needs to talk to someone about the referral to the eye dr please call [phone]

## 2015-08-18 ENCOUNTER — Other Ambulatory Visit: Payer: Self-pay | Admitting: Endocrinology

## 2015-09-13 ENCOUNTER — Other Ambulatory Visit: Payer: Self-pay | Admitting: Endocrinology

## 2015-09-18 ENCOUNTER — Other Ambulatory Visit: Payer: Self-pay | Admitting: Endocrinology

## 2015-09-21 ENCOUNTER — Other Ambulatory Visit: Payer: Self-pay

## 2015-09-21 MED ORDER — INSULIN LISPRO 100 UNIT/ML ~~LOC~~ SOLN: SUBCUTANEOUS | Status: DC

## 2015-10-13 ENCOUNTER — Ambulatory Visit: Payer: 59 | Admitting: Neurology

## 2015-11-09 DIAGNOSIS — Z941 Heart transplant status: Secondary | ICD-10-CM | POA: Diagnosis not present

## 2015-11-09 DIAGNOSIS — Z48298 Encounter for aftercare following other organ transplant: Secondary | ICD-10-CM | POA: Diagnosis not present

## 2016-02-02 DIAGNOSIS — Z23 Encounter for immunization: Secondary | ICD-10-CM | POA: Diagnosis not present

## 2016-04-08 DIAGNOSIS — H6123 Impacted cerumen, bilateral: Secondary | ICD-10-CM | POA: Diagnosis not present

## 2016-05-10 DIAGNOSIS — I151 Hypertension secondary to other renal disorders: Secondary | ICD-10-CM | POA: Diagnosis not present

## 2016-05-10 DIAGNOSIS — Z794 Long term (current) use of insulin: Secondary | ICD-10-CM | POA: Diagnosis not present

## 2016-05-10 DIAGNOSIS — N2889 Other specified disorders of kidney and ureter: Secondary | ICD-10-CM | POA: Diagnosis not present

## 2016-05-10 DIAGNOSIS — Z125 Encounter for screening for malignant neoplasm of prostate: Secondary | ICD-10-CM | POA: Diagnosis not present

## 2016-05-10 DIAGNOSIS — Z4821 Encounter for aftercare following heart transplant: Secondary | ICD-10-CM | POA: Diagnosis not present

## 2016-05-10 DIAGNOSIS — E119 Type 2 diabetes mellitus without complications: Secondary | ICD-10-CM | POA: Diagnosis not present

## 2016-05-10 DIAGNOSIS — Z48298 Encounter for aftercare following other organ transplant: Secondary | ICD-10-CM | POA: Diagnosis not present

## 2016-05-10 DIAGNOSIS — Z7982 Long term (current) use of aspirin: Secondary | ICD-10-CM | POA: Diagnosis not present

## 2016-05-10 DIAGNOSIS — Z941 Heart transplant status: Secondary | ICD-10-CM | POA: Diagnosis not present

## 2016-05-16 DIAGNOSIS — E119 Type 2 diabetes mellitus without complications: Secondary | ICD-10-CM | POA: Diagnosis not present

## 2016-06-02 DIAGNOSIS — R9721 Rising PSA following treatment for malignant neoplasm of prostate: Secondary | ICD-10-CM | POA: Diagnosis not present

## 2016-06-02 DIAGNOSIS — N393 Stress incontinence (female) (male): Secondary | ICD-10-CM | POA: Diagnosis not present

## 2016-06-02 DIAGNOSIS — N3040 Irradiation cystitis without hematuria: Secondary | ICD-10-CM | POA: Diagnosis not present

## 2016-06-02 DIAGNOSIS — R3915 Urgency of urination: Secondary | ICD-10-CM | POA: Diagnosis not present

## 2016-07-11 DIAGNOSIS — Z8546 Personal history of malignant neoplasm of prostate: Secondary | ICD-10-CM | POA: Diagnosis not present

## 2016-07-12 DIAGNOSIS — M1712 Unilateral primary osteoarthritis, left knee: Secondary | ICD-10-CM | POA: Diagnosis not present

## 2016-07-19 DIAGNOSIS — E119 Type 2 diabetes mellitus without complications: Secondary | ICD-10-CM | POA: Diagnosis not present

## 2016-07-19 DIAGNOSIS — H35363 Drusen (degenerative) of macula, bilateral: Secondary | ICD-10-CM | POA: Diagnosis not present

## 2016-07-19 DIAGNOSIS — H25013 Cortical age-related cataract, bilateral: Secondary | ICD-10-CM | POA: Diagnosis not present

## 2016-07-19 DIAGNOSIS — H2513 Age-related nuclear cataract, bilateral: Secondary | ICD-10-CM | POA: Diagnosis not present

## 2016-07-19 DIAGNOSIS — H40013 Open angle with borderline findings, low risk, bilateral: Secondary | ICD-10-CM | POA: Diagnosis not present

## 2016-07-24 DIAGNOSIS — Z0100 Encounter for examination of eyes and vision without abnormal findings: Secondary | ICD-10-CM | POA: Diagnosis not present

## 2016-07-25 DIAGNOSIS — K219 Gastro-esophageal reflux disease without esophagitis: Secondary | ICD-10-CM | POA: Diagnosis not present

## 2016-07-25 DIAGNOSIS — Z0000 Encounter for general adult medical examination without abnormal findings: Secondary | ICD-10-CM | POA: Diagnosis not present

## 2016-07-25 DIAGNOSIS — Z941 Heart transplant status: Secondary | ICD-10-CM | POA: Diagnosis not present

## 2016-07-25 DIAGNOSIS — E782 Mixed hyperlipidemia: Secondary | ICD-10-CM | POA: Diagnosis not present

## 2016-07-25 DIAGNOSIS — Z1159 Encounter for screening for other viral diseases: Secondary | ICD-10-CM | POA: Diagnosis not present

## 2016-07-25 DIAGNOSIS — E119 Type 2 diabetes mellitus without complications: Secondary | ICD-10-CM | POA: Diagnosis not present

## 2016-07-25 DIAGNOSIS — C61 Malignant neoplasm of prostate: Secondary | ICD-10-CM | POA: Diagnosis not present

## 2016-07-25 DIAGNOSIS — R69 Illness, unspecified: Secondary | ICD-10-CM | POA: Diagnosis not present

## 2016-07-25 DIAGNOSIS — I10 Essential (primary) hypertension: Secondary | ICD-10-CM | POA: Diagnosis not present

## 2016-08-22 DIAGNOSIS — K4090 Unilateral inguinal hernia, without obstruction or gangrene, not specified as recurrent: Secondary | ICD-10-CM | POA: Diagnosis not present

## 2016-08-23 ENCOUNTER — Encounter (HOSPITAL_COMMUNITY): Payer: Self-pay

## 2016-08-23 ENCOUNTER — Observation Stay (HOSPITAL_COMMUNITY)
Admission: EM | Admit: 2016-08-23 | Discharge: 2016-08-25 | Disposition: A | Discharge status: Home / Self Care | Payer: Medicare HMO | Attending: General Surgery | Admitting: General Surgery

## 2016-08-23 DIAGNOSIS — Z9641 Presence of insulin pump (external) (internal): Secondary | ICD-10-CM | POA: Insufficient documentation

## 2016-08-23 DIAGNOSIS — I252 Old myocardial infarction: Secondary | ICD-10-CM | POA: Insufficient documentation

## 2016-08-23 DIAGNOSIS — I10 Essential (primary) hypertension: Secondary | ICD-10-CM | POA: Diagnosis not present

## 2016-08-23 DIAGNOSIS — F5101 Primary insomnia: Secondary | ICD-10-CM

## 2016-08-23 DIAGNOSIS — Z8546 Personal history of malignant neoplasm of prostate: Secondary | ICD-10-CM | POA: Insufficient documentation

## 2016-08-23 DIAGNOSIS — Z941 Heart transplant status: Secondary | ICD-10-CM

## 2016-08-23 DIAGNOSIS — K4090 Unilateral inguinal hernia, without obstruction or gangrene, not specified as recurrent: Principal | ICD-10-CM

## 2016-08-23 DIAGNOSIS — F329 Major depressive disorder, single episode, unspecified: Secondary | ICD-10-CM | POA: Insufficient documentation

## 2016-08-23 DIAGNOSIS — G4700 Insomnia, unspecified: Secondary | ICD-10-CM

## 2016-08-23 DIAGNOSIS — Z794 Long term (current) use of insulin: Secondary | ICD-10-CM | POA: Insufficient documentation

## 2016-08-23 DIAGNOSIS — Z79899 Other long term (current) drug therapy: Secondary | ICD-10-CM | POA: Diagnosis not present

## 2016-08-23 DIAGNOSIS — Z7982 Long term (current) use of aspirin: Secondary | ICD-10-CM | POA: Diagnosis not present

## 2016-08-23 DIAGNOSIS — K219 Gastro-esophageal reflux disease without esophagitis: Secondary | ICD-10-CM

## 2016-08-23 DIAGNOSIS — Z87891 Personal history of nicotine dependence: Secondary | ICD-10-CM | POA: Insufficient documentation

## 2016-08-23 DIAGNOSIS — Z888 Allergy status to other drugs, medicaments and biological substances status: Secondary | ICD-10-CM | POA: Insufficient documentation

## 2016-08-23 DIAGNOSIS — E119 Type 2 diabetes mellitus without complications: Secondary | ICD-10-CM | POA: Diagnosis not present

## 2016-08-23 DIAGNOSIS — R69 Illness, unspecified: Secondary | ICD-10-CM | POA: Diagnosis not present

## 2016-08-23 LAB — GLUCOSE, CAPILLARY
Glucose-Capillary: 93 mg/dL (ref 65–99)
Glucose-Capillary: 109 mg/dL — ABNORMAL HIGH (ref 65–99)
Glucose-Capillary: 105 mg/dL — ABNORMAL HIGH (ref 65–99)

## 2016-08-23 LAB — CBC WITH DIFFERENTIAL/PLATELET
BASOS ABS: 0 10*3/uL (ref 0.0–0.1)
Basophils Relative: 0 %
EOS ABS: 0.2 10*3/uL (ref 0.0–0.7)
EOS PCT: 3 %
HCT: 49.1 % (ref 39.0–52.0)
Hemoglobin: 16.5 g/dL (ref 13.0–17.0)
LYMPHS ABS: 1.2 10*3/uL (ref 0.7–4.0)
LYMPHS PCT: 19 %
MCH: 29.2 pg (ref 26.0–34.0)
MCHC: 33.6 g/dL (ref 30.0–36.0)
MCV: 86.9 fL (ref 78.0–100.0)
MONO ABS: 0.5 10*3/uL (ref 0.1–1.0)
Monocytes Relative: 8 %
Neutro Abs: 4.5 10*3/uL (ref 1.7–7.7)
Neutrophils Relative %: 70 %
PLATELETS: 173 10*3/uL (ref 150–400)
RBC: 5.65 MIL/uL (ref 4.22–5.81)
RDW: 13.5 % (ref 11.5–15.5)
WBC: 6.4 10*3/uL (ref 4.0–10.5)

## 2016-08-23 LAB — COMPREHENSIVE METABOLIC PANEL WITH GFR
ALT: 23 U/L (ref 17–63)
AST: 22 U/L (ref 15–41)
Albumin: 3.5 g/dL (ref 3.5–5.0)
Alkaline Phosphatase: 62 U/L (ref 38–126)
Anion gap: 8 (ref 5–15)
BUN: 18 mg/dL (ref 6–20)
CO2: 26 mmol/L (ref 22–32)
Calcium: 8.9 mg/dL (ref 8.9–10.3)
Chloride: 106 mmol/L (ref 101–111)
Creatinine, Ser: 0.88 mg/dL (ref 0.61–1.24)
GFR calc Af Amer: >60 mL/min
GFR calc non Af Amer: >60 mL/min
Glucose, Bld: 121 mg/dL — ABNORMAL HIGH (ref 65–99)
Potassium: 3.8 mmol/L (ref 3.5–5.1)
Sodium: 140 mmol/L (ref 135–145)
Total Bilirubin: 0.9 mg/dL (ref 0.3–1.2)
Total Protein: 5.8 g/dL — ABNORMAL LOW (ref 6.5–8.1)

## 2016-08-23 LAB — PROTIME-INR
INR: 1.06
Prothrombin Time: 13.8 seconds (ref 11.4–15.2)

## 2016-08-23 LAB — URINALYSIS, ROUTINE W REFLEX MICROSCOPIC
Bilirubin Urine: NEGATIVE
Glucose, UA: NEGATIVE mg/dL
Hgb urine dipstick: NEGATIVE
Ketones, ur: NEGATIVE mg/dL
Leukocytes, UA: NEGATIVE
Nitrite: NEGATIVE
Protein, ur: NEGATIVE mg/dL
Specific Gravity, Urine: 1.02 (ref 1.005–1.030)
pH: 5 (ref 5.0–8.0)

## 2016-08-23 LAB — SURGICAL PCR SCREEN
MRSA, PCR: NEGATIVE
Staphylococcus aureus: NEGATIVE

## 2016-08-23 LAB — APTT: APTT: 38 s — AB (ref 24–36)

## 2016-08-23 MED ORDER — MELATONIN 3 MG PO TABS: 6.0000 mg | ORAL_TABLET | ORAL | Status: DC | PRN

## 2016-08-23 MED ORDER — ONDANSETRON 4 MG PO TBDP: 4.0000 mg | ORAL_TABLET | Freq: Four times a day (QID) | ORAL | Status: DC | PRN

## 2016-08-23 MED ORDER — INSULIN ASPART 100 UNIT/ML ~~LOC~~ SOLN
0.0000 [IU] | Freq: Three times a day (TID) | SUBCUTANEOUS | Status: DC
Start: 2016-08-23 — End: 2016-08-25

## 2016-08-23 MED ORDER — CEFAZOLIN SODIUM-DEXTROSE 2-4 GM/100ML-% IV SOLN
2.0000 g | INTRAVENOUS | Status: AC
  Administered 2016-08-24: 2 g via INTRAVENOUS
  Filled 2016-08-23: qty 100

## 2016-08-23 MED ORDER — LISINOPRIL 10 MG PO TABS
10.0000 mg | ORAL_TABLET | Freq: Every day | ORAL | Status: DC
  Administered 2016-08-24: 10 mg via ORAL
  Filled 2016-08-23 (×2): qty 1

## 2016-08-23 MED ORDER — ACETAMINOPHEN 325 MG PO TABS: 650.0000 mg | ORAL_TABLET | Freq: Four times a day (QID) | ORAL | Status: DC | PRN

## 2016-08-23 MED ORDER — ELUXADOLINE 100 MG PO TABS: 100.0000 mg | ORAL_TABLET | Freq: Two times a day (BID) | ORAL | Status: DC

## 2016-08-23 MED ORDER — HYDROCODONE-ACETAMINOPHEN 5-325 MG PO TABS
1.0000 | ORAL_TABLET | ORAL | Status: DC | PRN
  Administered 2016-08-25 (×2): 2 via ORAL
  Filled 2016-08-23 (×3): qty 2

## 2016-08-23 MED ORDER — ONDANSETRON HCL 4 MG/2ML IJ SOLN
4.0000 mg | Freq: Four times a day (QID) | INTRAMUSCULAR | Status: DC | PRN
  Administered 2016-08-24: 4 mg via INTRAVENOUS
  Filled 2016-08-23: qty 2

## 2016-08-23 MED ORDER — CITALOPRAM HYDROBROMIDE 20 MG PO TABS
20.0000 mg | ORAL_TABLET | Freq: Every day | ORAL | Status: DC
  Administered 2016-08-24: 20 mg via ORAL
  Filled 2016-08-23 (×3): qty 1

## 2016-08-23 MED ORDER — ROSUVASTATIN CALCIUM 10 MG PO TABS
10.0000 mg | ORAL_TABLET | Freq: Every day | ORAL | Status: DC
  Administered 2016-08-24: 10 mg via ORAL
  Filled 2016-08-23 (×3): qty 1

## 2016-08-23 MED ORDER — ELUXADOLINE 100 MG PO TABS
100.0000 mg | ORAL_TABLET | Freq: Two times a day (BID) | ORAL | Status: DC
  Filled 2016-08-23: qty 1

## 2016-08-23 MED ORDER — DIPHENHYDRAMINE HCL 50 MG/ML IJ SOLN: 12.5000 mg | Freq: Four times a day (QID) | INTRAMUSCULAR | Status: DC | PRN

## 2016-08-23 MED ORDER — IBUPROFEN 600 MG PO TABS: 600.0000 mg | ORAL_TABLET | Freq: Four times a day (QID) | ORAL | Status: DC | PRN

## 2016-08-23 MED ORDER — ACETAMINOPHEN 650 MG RE SUPP: 650.0000 mg | Freq: Four times a day (QID) | RECTAL | Status: DC | PRN

## 2016-08-23 MED ORDER — MYCOPHENOLATE MOFETIL 250 MG PO CAPS
1500.0000 mg | ORAL_CAPSULE | Freq: Two times a day (BID) | ORAL | Status: DC
  Administered 2016-08-23 – 2016-08-24 (×3): 1500 mg via ORAL
  Filled 2016-08-23 (×5): qty 6

## 2016-08-23 MED ORDER — INSULIN ASPART 100 UNIT/ML ~~LOC~~ SOLN: 0.0000 [IU] | Freq: Every day | SUBCUTANEOUS | Status: DC

## 2016-08-23 MED ORDER — DIPHENHYDRAMINE HCL 12.5 MG/5ML PO ELIX: 12.5000 mg | ORAL_SOLUTION | Freq: Four times a day (QID) | ORAL | Status: DC | PRN

## 2016-08-23 MED ORDER — ASPIRIN EC 81 MG PO TBEC
81.0000 mg | DELAYED_RELEASE_TABLET | Freq: Every day | ORAL | Status: DC
  Administered 2016-08-24: 81 mg via ORAL
  Filled 2016-08-23 (×2): qty 1

## 2016-08-23 MED ORDER — PANTOPRAZOLE SODIUM 40 MG PO TBEC
40.0000 mg | DELAYED_RELEASE_TABLET | Freq: Every day | ORAL | Status: DC
  Administered 2016-08-24: 40 mg via ORAL
  Filled 2016-08-23 (×2): qty 1

## 2016-08-23 MED ORDER — TACROLIMUS 1 MG PO CAPS
3.0000 mg | ORAL_CAPSULE | Freq: Two times a day (BID) | ORAL | Status: DC
  Administered 2016-08-23 – 2016-08-24 (×2): 1 mg via ORAL
  Filled 2016-08-23 (×3): qty 3

## 2016-08-23 MED ORDER — SODIUM CHLORIDE 0.9 % IV SOLN
INTRAVENOUS | Status: DC
  Administered 2016-08-23 – 2016-08-25 (×3): via INTRAVENOUS

## 2016-08-23 NOTE — Consult Note (Signed)
Reason for Consult:painful inguingal hernia Referring Physician: Dr. Aileen Pilot PCP:  John [name]  Cardiologist:    John [name] is an 66 y.o. male.   HPI: Pt with new right inguinal hernia that presented 2-3 weeks ago.  It is becoming progressively painful when it comes out and this happens now with standing.  He was seen by his PCP, and referred to [facility].  No appointments are currently available till 6/13.  Because of pain he came to the ED for relief.    PMH includes, heart transplant on Prograf and Cellcept.  He has diabetes, hx of hypertension, GERD, prior MI, and cancer of the prostate.  We are ask to see.    Past Medical History:  Diagnosis Date  . Cancer [facility])    prostate  . Complication of anesthesia    Problems with waking up and with intubation  . Diabetes mellitus without complication ([place])   . GERD (gastroesophageal reflux disease)   . Hypertension   . Myocardial infarction [facility])     Past Surgical History:  Procedure Laterality Date  . APPENDECTOMY    . CARDIAC CATHETERIZATION     Novemer 2014  . COLONOSCOPY    . COLONOSCOPY WITH PROPOFOL N/A 06/03/2013   Procedure: COLONOSCOPY WITH PROPOFOL;  Surgeon: Lear Ng, [name];  Location: [place];  Service: Endoscopy;  Laterality: N/A;  . ESOPHAGOGASTRODUODENOSCOPY (EGD) WITH PROPOFOL N/A 06/03/2013   Procedure: ESOPHAGOGASTRODUODENOSCOPY (EGD) WITH PROPOFOL;  Surgeon: Lear Ng, [name];  Location: [place];  Service: Endoscopy;  Laterality: N/A;  . HEART TRANSPLANT     Hx; of x 2  . PROSTATECTOMY      Family History  Problem Relation Age of Onset  . Heart disease Mother   . Heart disease Father   . Diabetes Brother   . Cancer Brother     Social History:  reports that he has quit smoking. He has never used smokeless tobacco. He reports that he drinks alcohol. He reports that he does not use drugs.  Allergies:  Allergies  Allergen Reactions  . Metformin And Related     Can't tolerate     Prior to Admission medications   Medication Sig Start Date End Date Taking? Authorizing Provider  ACCU-CHEK FASTCLIX LANCETS MISC Use to check blood sugar 6 times per day dx code E11.9 08/19/14   Elayne Snare, [name]  aspirin 81 MG tablet Take 81 mg by mouth daily.    Provider, Historical, [name]  calcium carbonate (OS-CAL) 600 MG TABS tablet Take 600 mg by mouth 2 (two) times daily with a meal.    Provider, Historical, [name]  canagliflozin (INVOKANA) 100 MG TABS tablet 1 tablet before breakfast 02/20/15   Elayne Snare, [name]  citalopram (CELEXA) 20 MG tablet Take 20 mg by mouth daily.    Provider, Historical, [name]  glucose blood (FREESTYLE TEST STRIPS) test strip Use as instructed to check blood sugar 6 times per day dx code E11.9 08/19/14   Elayne Snare, [name]  Insulin Disposable Pump (OMNIPOD) MISC Use every 3 days 09/18/15   Elayne Snare, [name]  insulin lispro (HUMALOG) 100 UNIT/ML injection Use max of 70 units per day with insulin pump 09/21/15   Elayne Snare, [name]  lisinopril (PRINIVIL,ZESTRIL) 10 MG tablet Take 10 mg by mouth daily.    Provider, Historical, [name]  magnesium oxide (MAG-OX) 400 MG tablet Take 400 mg by mouth daily.    Provider, Historical, [name]  MELATONIN PO Take 6 mg by mouth as  needed.    Provider, Historical, [name]  mycophenolate (CELLCEPT) 500 MG tablet Take 1,500 mg by mouth 2 (two) times daily.    Provider, Historical, [name]  omeprazole (PRILOSEC) 20 MG capsule Take 20 mg by mouth daily.    Provider, Historical, [name]  rosuvastatin (CRESTOR) 10 MG tablet Take 10 mg by mouth daily.    Provider, Historical, [name]  tacrolimus (PROGRAF) 1 MG capsule Take 3 mg by mouth 2 (two) times daily.    Provider, Historical, [name]     No results found for this or any previous visit (from the past 48 hour(s)).  No results found.  Review of Systems  Constitutional: Negative.   HENT: Negative.   Eyes: Negative.   Respiratory: Negative.   Cardiovascular: Negative.   Gastrointestinal: Negative.   Genitourinary: Negative.    Musculoskeletal: Negative.   Skin: Negative.   Neurological: Negative.   Endo/Heme/Allergies: Negative.   Psychiatric/Behavioral: Negative.    Blood pressure 117/89, pulse 92, temperature 98.7 F (37.1 C), temperature source Oral, resp. rate 17, SpO2 99 %. Physical Exam  Constitutional: He is oriented to person, place, and time. He appears well-developed and well-nourished. No distress.  HENT:  Head: Normocephalic and atraumatic.  Nose: Nose normal.  Mouth/Throat: No oropharyngeal exudate.  Eyes: Right eye exhibits no discharge. Left eye exhibits no discharge. Scleral icterus is present.  Pupils equal  Neck: Normal range of motion. Neck supple. No JVD present. No tracheal deviation present. No thyromegaly present.  Cardiovascular: Normal rate, regular rhythm, normal heart sounds and intact distal pulses.   No murmur heard. Respiratory: Effort normal and breath sounds normal. No respiratory distress. He has no wheezes. He has no rales. He exhibits no tenderness.  Sternotomy scars are well-healed.  GI: Soft. Bowel sounds are normal. He exhibits no distension and no mass. There is no tenderness. There is no rebound and no guarding.  Right inguinal hernia more apparent with standing. Comes with simply standing up. Painful to touch and ambulation. He also has a midline incision adjacent to the umbilicus with areas small umbilical hernia. Laparoscopic prostatectomy.  Musculoskeletal: He exhibits no edema or tenderness.  Neurological: He is alert and oriented to person, place, and time. No cranial nerve deficit. Coordination normal.  Skin: Skin is warm and dry. No rash noted. He is not diaphoretic. No erythema. No pallor.  Psychiatric: He has a normal mood and affect. His behavior is normal. Judgment and thought content normal.    Assessment/Plan: Painful right inguinal hernia Status post heart transplant 10/1998, and 03/2012 On daily CellCept and Prograf Type 2 diabetes - well  controlled with insulin pump Prostate cancer status post prostatectomy 2006 Hypertension GERD  Plan: Patient seen and evaluated by Dr. Rosendo Gros. The hernia comes out with simply standing. It hurts to walk with the hernia out. He reports his diabetes is well controlled with insulin pump at [facility]. He has discussed having a hernia fixed with his transplant team at [facility]. They're comfortable with [place] repairing his hernia here. It is Dr. Johney Frame opinion, that he should be admitted. Will ask medicine to consult to help with his diabetes and transplant medications. Will plan to do him tomorrow if possible. The risks and benefits were discussed with the patient and his wife in detail.      He is discussed having the hernia fixed with his transplant teamJENNINGS,Maci Eickholt 08/23/2016, 2:30 PM

## 2016-08-23 NOTE — H&P (Signed)
Attestation signed by Ralene Ok, MD at 08/23/2016 4:16 PM  I have seen and examined the pt and agree with PA-Jenning's note. Pt with RIH and significant pain.  Pt states that it is limiting his mobility. Pt w/ a h/o heart transplant x 2 at [facility]. Secondary to patient's sx's he would benefit from surgical repair of his hernia. Will plan on Lap vs. Open RIHR with mesh All risks and benefits were discussed with the patient, to generally include infection, bleeding, damage to surrounding structures, acute and chronic nerve pain, and recurrence. Alternatives were offered and described.  All questions were answered and the patient voiced understanding of the procedure and wishes to proceed at this point.        Expand All Collapse All   Hide copied text Hover for attribution information Reason for Consult:painful inguingal hernia Referring Physician: Dr. Aileen Pilot PCP:  Mayra Neer, MD  Cardiologist:     BRAULIO KIEDROWSKI is an 66 y.o. male.    HPI: Pt with new right inguinal hernia that presented 2-3 weeks ago.  It is becoming progressively painful when it comes out and this happens now with standing.  He was seen by his PCP, and referred to [facility].  No appointments are currently available till 6/13.  Because of pain he came to the ED for relief.     PMH includes, heart transplant on Prograf and Cellcept.  He has diabetes, hx of hypertension, GERD, prior MI, and cancer of the prostate.  We are ask to see.         Past Medical History:  Diagnosis Date  . Cancer [facility])      prostate  . Complication of anesthesia      Problems with waking up and with intubation  . Diabetes mellitus without complication ([place])    . GERD (gastroesophageal reflux disease)    . Hypertension    . Myocardial infarction [facility])             Past Surgical History:  Procedure Laterality Date  . APPENDECTOMY      . CARDIAC CATHETERIZATION        Novemer 2014  . COLONOSCOPY      . COLONOSCOPY WITH PROPOFOL  N/A 06/03/2013    Procedure: COLONOSCOPY WITH PROPOFOL;  Surgeon: Lear Ng, MD;  Location: [place];  Service: Endoscopy;  Laterality: N/A;  . ESOPHAGOGASTRODUODENOSCOPY (EGD) WITH PROPOFOL N/A 06/03/2013    Procedure: ESOPHAGOGASTRODUODENOSCOPY (EGD) WITH PROPOFOL;  Surgeon: Lear Ng, MD;  Location: [place];  Service: Endoscopy;  Laterality: N/A;  . HEART TRANSPLANT        Hx; of x 2  . PROSTATECTOMY               Family History  Problem Relation Age of Onset  . Heart disease Mother    . Heart disease Father    . Diabetes Brother    . Cancer Brother        Social History:  reports that he has quit smoking. He has never used smokeless tobacco. He reports that he drinks alcohol. He reports that he does not use drugs.   Allergies:       Allergies  Allergen Reactions  . Metformin And Related        Can't tolerate             Prior to Admission medications   Medication Sig Start Date End Date Taking? Authorizing Provider  ACCU-CHEK FASTCLIX LANCETS MISC Use  to check blood sugar 6 times per day dx code E11.9 08/19/14     Elayne Snare, MD  aspirin 81 MG tablet Take 81 mg by mouth daily.       [provider]  calcium carbonate (OS-CAL) 600 MG TABS tablet Take 600 mg by mouth 2 (two) times daily with a meal.       [provider]  canagliflozin (INVOKANA) 100 MG TABS tablet 1 tablet before breakfast 02/20/15     Elayne Snare, MD  citalopram (CELEXA) 20 MG tablet Take 20 mg by mouth daily.       [provider]  glucose blood (FREESTYLE TEST STRIPS) test strip Use as instructed to check blood sugar 6 times per day dx code E11.9 08/19/14     Elayne Snare, MD  Insulin Disposable Pump (OMNIPOD) MISC Use every 3 days 09/18/15     Elayne Snare, MD  insulin lispro (HUMALOG) 100 UNIT/ML injection Use max of 70 units per day with insulin pump 09/21/15     Elayne Snare, MD  lisinopril (PRINIVIL,ZESTRIL) 10 MG tablet Take 10 mg by mouth daily.        [provider]  magnesium oxide (MAG-OX) 400 MG tablet Take 400 mg by mouth daily.       [provider]  MELATONIN PO Take 6 mg by mouth as needed.       [provider]  mycophenolate (CELLCEPT) 500 MG tablet Take 1,500 mg by mouth 2 (two) times daily.       [provider]  omeprazole (PRILOSEC) 20 MG capsule Take 20 mg by mouth daily.       [provider]  rosuvastatin (CRESTOR) 10 MG tablet Take 10 mg by mouth daily.       [provider]  tacrolimus (PROGRAF) 1 MG capsule Take 3 mg by mouth 2 (two) times daily.       [provider]        Lab Results Last 48 Hours  No results found for this or any previous visit (from the past 48 hour(s)).     Imaging Results (Last 48 hours)  No results found.     Review of Systems  Constitutional: Negative.   HENT: Negative.   Eyes: Negative.   Respiratory: Negative.   Cardiovascular: Negative.   Gastrointestinal: Negative.   Genitourinary: Negative.   Musculoskeletal: Negative.   Skin: Negative.   Neurological: Negative.   Endo/Heme/Allergies: Negative.   Psychiatric/Behavioral: Negative.     Blood pressure 117/89, pulse 92, temperature 98.7 F (37.1 C), temperature source Oral, resp. rate 17, SpO2 99 %. Physical Exam  Constitutional: He is oriented to person, place, and time. He appears well-developed and well-nourished. No distress.  HENT:  Head: Normocephalic and atraumatic.  Nose: Nose normal.  Mouth/Throat: No oropharyngeal exudate.  Eyes: Right eye exhibits no discharge. Left eye exhibits no discharge. Scleral icterus is present.  Pupils equal  Neck: Normal range of motion. Neck supple. No JVD present. No tracheal deviation present. No thyromegaly present.  Cardiovascular: Normal rate, regular rhythm, normal heart sounds and intact distal pulses.   No murmur heard. Respiratory: Effort normal and breath sounds normal. No respiratory distress. He has no wheezes.  He has no rales. He exhibits no tenderness.  Sternotomy scars are well-healed.  GI: Soft. Bowel sounds are normal. He exhibits no distension and no mass. There is no tenderness. There is no rebound and no guarding.  Right inguinal hernia more apparent  with standing. Comes with simply standing up. Painful to touch and ambulation. He also has a midline incision adjacent to the umbilicus with areas small umbilical hernia. Laparoscopic prostatectomy.  Musculoskeletal: He exhibits no edema or tenderness.  Neurological: He is alert and oriented to person, place, and time. No cranial nerve deficit. Coordination normal.  Skin: Skin is warm and dry. No rash noted. He is not diaphoretic. No erythema. No pallor.  Psychiatric: He has a normal mood and affect. His behavior is normal. Judgment and thought content normal.      Assessment/Plan: Painful right inguinal hernia Status post heart transplant 10/1998, and 03/2012 On daily CellCept and Prograf Type 2 diabetes - well controlled with insulin pump Prostate cancer status post prostatectomy 2006 Hypertension GERD   Plan: Patient seen and evaluated by Dr. Rosendo Gros. The hernia comes out with simply standing. It hurts to walk with the hernia out. He reports his diabetes is well controlled with insulin pump at [facility]. He has discussed having a hernia fixed with his transplant team at [facility]. They're comfortable with [place] repairing his hernia here. It is Dr. Johney Frame opinion, that he should be admitted. Will ask medicine to consult to help with his diabetes and transplant medications. Will plan to do him tomorrow if possible. The risks and benefits were discussed with the patient and his wife in detail.           He is discussed having the hernia fixed with his transplant teamJENNINGS,Burnham Trost 08/23/2016, 2:30 PM           Cosigned by: Ralene Ok, MD at 08/23/2016  4:16 PM  Revision History   Date/Time User Provider Type  Action  08/23/2016  4:16 PM Ralene Ok, MD Physician Cosign  08/23/2016  3:51 PM Earnstine Regal, PA-C Physician Assistant Sign  08/23/2016  3:49 PM Earnstine Regal, PA-C Physician Assistant Sign  View Details Report     Routing History   Date/Time From To Method  08/23/2016  4:16 PM Ralene Ok, MD Earnstine Regal, PA-C In [facility]  08/23/2016  4:16 PM Ralene Ok, MD Mayra Neer, MD Fax

## 2016-08-23 NOTE — ED Triage Notes (Signed)
Pt has right inguinal hernia. He reports he saw his PCP yesterday and nothing was done. Pt denies n/v/d. Pt able to push the hernia back in on his own.

## 2016-08-23 NOTE — Progress Notes (Signed)
1745 Received pt from ED, A&O x4. Denies pain at this time.

## 2016-08-23 NOTE — ED Provider Notes (Signed)
[place] DEPT Provider Note   CSN: [id number] Arrival date & time: 08/23/16  1314  By signing my name below, I, Ethelle Lyon Long, attest that this documentation has been prepared under the direction and in the presence of Isla Pence, MD. Electronically Signed: Ethelle Lyon Long, Scribe. 08/23/2016. 2:03 PM.   History   Chief Complaint Chief Complaint  Patient presents with  . Inguinal Hernia   The history is provided by the patient and medical records. No language interpreter was used.   HPI Comments:  John [name] is a 66 y.o. male with a PMHx of DM, GERD, HTN, prior MI, and Prostate CA, and heart transplant who presents to the Emergency Department complaining of intermittent, gradually worsening, right-sided suprapubic pain onset a couple weeks ago. Pt reports over the last couple weeks, this inguinal hernia type pain has been gradually worsening. When the hernia is 'out', the pain is quantified as 10/10 and severe. He was seen at his PCP for this issue who referred him to [facility] who said they could not see him for a consult until 09/14/16 leading him to be seen today for alleviation of his pain. Sitting still helps alleviate his pain while ambulation exacerbates his pain. Pt denies nausea, vomiting, diarrhea, urinary issues, and any other complaints at this time.   Past Medical History:  Diagnosis Date  . Cancer [facility])    prostate  . Complication of anesthesia    Problems with waking up and with intubation  . Diabetes mellitus without complication ([place])   . GERD (gastroesophageal reflux disease)   . Hypertension   . Myocardial infarction [facility])    Patient Active Problem List   Diagnosis Date Noted  . Hernia, inguinal, right 08/23/2016  . Diabetes mellitus, stable ([place]) 06/11/2014  . Nonspecific abnormal finding in stool contents 06/03/2013  . Generalized abdominal pain 06/03/2013  . Change in bowel habits 06/03/2013   Past Surgical History:  Procedure  Laterality Date  . APPENDECTOMY    . CARDIAC CATHETERIZATION     Novemer 2014  . COLONOSCOPY    . COLONOSCOPY WITH PROPOFOL N/A 06/03/2013   Procedure: COLONOSCOPY WITH PROPOFOL;  Surgeon: Lear Ng, MD;  Location: [place];  Service: Endoscopy;  Laterality: N/A;  . ESOPHAGOGASTRODUODENOSCOPY (EGD) WITH PROPOFOL N/A 06/03/2013   Procedure: ESOPHAGOGASTRODUODENOSCOPY (EGD) WITH PROPOFOL;  Surgeon: Lear Ng, MD;  Location: [place];  Service: Endoscopy;  Laterality: N/A;  . HEART TRANSPLANT     Hx; of x 2  . PROSTATECTOMY      Home Medications    Prior to Admission medications   Medication Sig Start Date End Date Taking? Authorizing Provider  ACCU-CHEK FASTCLIX LANCETS MISC Use to check blood sugar 6 times per day dx code E11.9 08/19/14   Elayne Snare, MD  aspirin 81 MG tablet Take 81 mg by mouth daily.    [provider]  calcium carbonate (OS-CAL) 600 MG TABS tablet Take 600 mg by mouth 2 (two) times daily with a meal.    [provider]  canagliflozin (INVOKANA) 100 MG TABS tablet 1 tablet before breakfast 02/20/15   Elayne Snare, MD  citalopram (CELEXA) 20 MG tablet Take 20 mg by mouth daily.    [provider]  glucose blood (FREESTYLE TEST STRIPS) test strip Use as instructed to check blood sugar 6 times per day dx code E11.9 08/19/14   Elayne Snare, MD  Insulin Disposable Pump (OMNIPOD) MISC Use every 3 days 09/18/15   Dwyane Dee,  Vicenta Aly, MD  insulin lispro (HUMALOG) 100 UNIT/ML injection Use max of 70 units per day with insulin pump 09/21/15   Elayne Snare, MD  lisinopril (PRINIVIL,ZESTRIL) 10 MG tablet Take 10 mg by mouth daily.    [provider]  magnesium oxide (MAG-OX) 400 MG tablet Take 400 mg by mouth daily.    [provider]  MELATONIN PO Take 6 mg by mouth as needed.    [provider]  mycophenolate (CELLCEPT) 500 MG tablet Take 1,500 mg by mouth 2 (two) times daily.    [provider]  omeprazole  (PRILOSEC) 20 MG capsule Take 20 mg by mouth daily.    [provider]  rosuvastatin (CRESTOR) 10 MG tablet Take 10 mg by mouth daily.    [provider]  tacrolimus (PROGRAF) 1 MG capsule Take 3 mg by mouth 2 (two) times daily.    [provider]   Family History Family History  Problem Relation Age of Onset  . Heart disease Mother   . Heart disease Father   . Diabetes Brother   . Cancer Brother    Social History Social History  Substance Use Topics  . Smoking status: Former Research scientist (life sciences)  . Smokeless tobacco: Never Used     Comment: quit smoking cigarettes in 1998  . Alcohol use Yes     Comment: rare   Allergies   Metformin and related   Review of Systems Review of Systems All systems reviewed and are negative for acute change except as noted in the HPI.   Physical Exam Updated Vital Signs BP 117/89 (BP Location: Right Arm)   Pulse 92   Temp 98.7 F (37.1 C) (Oral)   Resp 17   SpO2 99%   Physical Exam  Constitutional: He is oriented to person, place, and time. He appears well-developed and well-nourished.  HENT:  Head: Normocephalic.  Eyes: Conjunctivae are normal.  Cardiovascular: Normal rate.   Pulmonary/Chest: Effort normal.  Abdominal: He exhibits no distension.  Right inguinal hernia easily reducible   Musculoskeletal: Normal range of motion.  Neurological: He is alert and oriented to person, place, and time.  Skin: Skin is warm and dry.  Psychiatric: He has a normal mood and affect.  Nursing note and vitals reviewed.  ED Treatments / Results  DIAGNOSTIC STUDIES:  Oxygen Saturation is 99% on RA, normal by my interpretation.    COORDINATION OF CARE:  2:01 PM Discussed treatment plan with pt at bedside including consult to [facility] and pt agreed to plan.   2:02 PM Pt was offered pain medication but declined them at this time.   Labs (all labs ordered are listed, but only abnormal results are displayed) Labs Reviewed    COMPREHENSIVE METABOLIC PANEL  CBC WITH DIFFERENTIAL/PLATELET  URINALYSIS, ROUTINE W REFLEX MICROSCOPIC  HIV ANTIBODY (ROUTINE TESTING)  APTT  PROTIME-INR    EKG  EKG Interpretation None       Radiology No results found.  Procedures Procedures (including critical care time)  Medications Ordered in ED Medications  tacrolimus (PROGRAF) capsule 3 mg (not administered)  mycophenolate (CELLCEPT) tablet 1,500 mg (not administered)  0.9 %  sodium chloride infusion (not administered)  acetaminophen (TYLENOL) tablet 650 mg (not administered)    Or  acetaminophen (TYLENOL) suppository 650 mg (not administered)  ibuprofen (ADVIL,MOTRIN) tablet 600 mg (not administered)  HYDROcodone-acetaminophen (NORCO/VICODIN) 5-325 MG per tablet 1-2 tablet (not administered)  diphenhydrAMINE (BENADRYL) 12.5 MG/5ML elixir 12.5 mg (not administered)    Or  diphenhydrAMINE (BENADRYL) injection 12.5 mg (not administered)  ondansetron (ZOFRAN-ODT) disintegrating tablet 4 mg (not administered)    Or  ondansetron (ZOFRAN) injection 4 mg (not administered)  ceFAZolin (ANCEF) IVPB 2g/100 mL premix (not administered)     Initial Impression / Assessment and Plan / ED Course  I have reviewed the triage vital signs and the nursing notes.  Pertinent labs & imaging results that were available during my care of the patient were reviewed by me and considered in my medical decision making (see chart for details).  Pt d/w surgery who will see him in the ED.  Dr. Rosendo Gros saw pt and felt that he would be best served by admission and then surgery.  [facility] transplant ok with him staying here.   Final Clinical Impressions(s) / ED Diagnoses   Final diagnoses:  Right inguinal hernia    New Prescriptions New Prescriptions   No medications on file    I personally performed the services described in this documentation, which was scribed in my presence. The recorded information has been reviewed and is  accurate.     Isla Pence, MD 08/23/16 [phone]

## 2016-08-23 NOTE — Consult Note (Signed)
Medical Consultation   John [name]  RCB:[id number]  DOB: 02/03/1951  DOA: 08/23/2016  PCP: Mayra Neer, MD   Outpatient Specialists: Cardiology, Cardiothoracic surgery   Requesting physician: Dr. Rosendo Gros   Reason for consultation: General medical management    History of Present Illness: John [name] is an 66 y.o. male prostate cancer, DM, GERD, HTN, MI s/p cardiac transplant x2. Followed by [facility] cardiothoracic surgery. Pt presenting w/ gradual onset R sided suprapubic pain. Started a couple weeks ago. Intermittent. Getting worse. Known history of hernia in that area. Patient has not seen a surgeon in the outpatient setting prior to today's episode the primary care physician diagnosed him with an inguinal hernia. Pain worsens with standing and ambulation in general. Denies fevers, nausea, vomiting, diarrhea, dysuria, frequency, chest pain, shortness breath, palpitations, lower external swelling, neck stiffness, cough, focal neurological deficit.  Patient states he has responded Ms. cardiothoracic team at [facility] and they had given him the all clear to be able to have surgery if needed to correct hernia.      Review of Systems:  ROS As per HPI otherwise all other systems reviewed and are negative   Past Medical History: Past Medical History:  Diagnosis Date  . Cancer [facility])    prostate  . Complication of anesthesia    Problems with waking up and with intubation  . Diabetes mellitus without complication ([place])   . GERD (gastroesophageal reflux disease)   . Hypertension   . Myocardial infarction [facility])     Past Surgical History: Past Surgical History:  Procedure Laterality Date  . APPENDECTOMY    . CARDIAC CATHETERIZATION     Novemer 2014  . COLONOSCOPY    . COLONOSCOPY WITH PROPOFOL N/A 06/03/2013   Procedure: COLONOSCOPY WITH PROPOFOL;  Surgeon: Lear Ng, MD;  Location: [place];  Service: Endoscopy;  Laterality: N/A;  .  ESOPHAGOGASTRODUODENOSCOPY (EGD) WITH PROPOFOL N/A 06/03/2013   Procedure: ESOPHAGOGASTRODUODENOSCOPY (EGD) WITH PROPOFOL;  Surgeon: Lear Ng, MD;  Location: [place];  Service: Endoscopy;  Laterality: N/A;  . HEART TRANSPLANT     Hx; of x 2  . PROSTATECTOMY       Allergies:   Allergies  Allergen Reactions  . Metformin And Related     Can't tolerate     Social History:  reports that he has quit smoking. He has never used smokeless tobacco. He reports that he drinks alcohol. He reports that he does not use drugs.   Family History: Family History  Problem Relation Age of Onset  . Heart disease Mother   . Heart disease Father   . Diabetes Brother   . Cancer Brother      Physical Exam: Vitals:   08/23/16 1330 08/23/16 1710 08/23/16 1738  BP: 117/89 116/80 134/83  Pulse: 92 78 86  Resp: 17 16 17   Temp: 98.7 F (37.1 C) 98.2 F (36.8 C) 98.3 F (36.8 C)  TempSrc: Oral Oral Oral  SpO2: 99% 99% 98%  Weight:   67.6 kg (149 lb 0.5 oz)  Height:   5\' 6"  (1.676 m)    General:  Appears calm and comfortable Eyes:  PERRL, EOMI, normal lids, iris ENT:  grossly normal hearing, lips & tongue, mmm Neck:  no LAD, masses or thyromegaly Cardiovascular:  RRR, no m/r/g. No LE edema.  Respiratory:  CTA bilaterally, no w/r/r. Normal respiratory effort. Abdomen: NABS, nondistended.  Skin:  no rash or induration seen on limited exam Musculoskeletal:  grossly normal tone BUE/BLE, good ROM, no bony abnormality Psychiatric:  grossly normal mood and affect, speech fluent and appropriate, AOx3 Neurologic:  CN 2-12 grossly intact, moves all extremities in coordinated fashion, sensation intact  Data reviewed:  I have personally reviewed following labs and imaging studies Labs:  CBC:  Recent Labs Lab 08/23/16 1610  WBC 6.4  NEUTROABS 4.5  HGB 16.5  HCT 49.1  MCV 86.9  PLT 867    Basic Metabolic Panel:  Recent Labs Lab 08/23/16 1610  NA 140  K 3.8  CL 106  CO2  26  GLUCOSE 121*  BUN 18  CREATININE 0.88  CALCIUM 8.9   GFR Estimated Creatinine Clearance: 77.5 mL/min (by C-G formula based on SCr of 0.88 mg/dL). Liver Function Tests:  Recent Labs Lab 08/23/16 1610  AST 22  ALT 23  ALKPHOS 62  BILITOT 0.9  PROT 5.8*  ALBUMIN 3.5   No results for input(s): LIPASE, AMYLASE in the last 168 hours. No results for input(s): AMMONIA in the last 168 hours. Coagulation profile  Recent Labs Lab 08/23/16 1610  INR 1.06    Cardiac Enzymes: No results for input(s): CKTOTAL, CKMB, CKMBINDEX, TROPONINI in the last 168 hours. BNP: Invalid input(s): POCBNP CBG:  Recent Labs Lab 08/23/16 1751  GLUCAP 93   D-Dimer No results for input(s): DDIMER in the last 72 hours. Hgb A1c No results for input(s): HGBA1C in the last 72 hours. Lipid Profile No results for input(s): CHOL, HDL, LDLCALC, TRIG, CHOLHDL, LDLDIRECT in the last 72 hours. Thyroid function studies No results for input(s): TSH, T4TOTAL, T3FREE, THYROIDAB in the last 72 hours.  Invalid input(s): FREET3 Anemia work up No results for input(s): VITAMINB12, FOLATE, FERRITIN, TIBC, IRON, RETICCTPCT in the last 72 hours. Urinalysis    Component Value Date/Time   COLORURINE YELLOW 07/27/2008 1412   APPEARANCEUR CLEAR 07/27/2008 1412   LABSPEC 1.027 07/27/2008 1412   PHURINE 5.5 07/27/2008 1412   GLUCOSEU NEGATIVE 07/27/2008 1412   HGBUR NEGATIVE 07/27/2008 1412   BILIRUBINUR NEGATIVE 07/27/2008 1412   KETONESUR NEGATIVE 07/27/2008 1412   PROTEINUR NEGATIVE 07/27/2008 1412   UROBILINOGEN 0.2 07/27/2008 1412   NITRITE NEGATIVE 07/27/2008 1412   LEUKOCYTESUR  07/27/2008 1412    NEGATIVE MICROSCOPIC NOT DONE ON URINES WITH NEGATIVE PROTEIN, BLOOD, LEUKOCYTES, NITRITE, OR GLUCOSE <1000 mg/dL.     Microbiology No results found for this or any previous visit (from the past 240 hour(s)).     Inpatient Medications:   Scheduled Meds: . aspirin  81 mg Oral Daily  . citalopram   20 mg Oral Daily  . Eluxadoline  100 mg Oral BID  . insulin aspart  0-5 Units Subcutaneous QHS  . insulin aspart  0-9 Units Subcutaneous TID WC  . [START ON 08/24/2016] lisinopril  10 mg Oral Daily  . mycophenolate  1,500 mg Oral Q12H  . pantoprazole  40 mg Oral Daily  . rosuvastatin  10 mg Oral Daily  . tacrolimus  3 mg Oral Q12H   Continuous Infusions: . sodium chloride    . [START ON 08/24/2016]  ceFAZolin (ANCEF) IV       Radiological Exams on Admission: No results found.  Impression/Recommendations Active Problems:   Hernia, inguinal, right   Diabetes mellitus with insulin therapy ([place])   Heart transplant recipient [facility])   GERD (gastroesophageal reflux disease)   Essential hypertension   Insomnia  R Inguinal Hernia: likely in need of  operative repair.  - Managmenet per primary team  Diabetes: well controlled on insulin pump.  - Remove pump - SSI  Heart transplant: x2. Last transplant failed after stopping his cellcept (done at the request of his urologist for surgical procedure). Followed by [facility] and states OK for surgery - continue Cellcept and Prograf - Continue ASA, Statin   HTN: - continue lisinopril  Depression: - continue celexa  GERD: - continue ppi  Insomnia: - continue melatonin  Thank you for this consultation.  Our [facility] hospitalist team will follow the patient with you.    Maecy Podgurski J M.D. [facility] 08/23/2016, 5:54 PM

## 2016-08-24 ENCOUNTER — Observation Stay (HOSPITAL_COMMUNITY): Payer: Medicare HMO | Admitting: Certified Registered Nurse Anesthetist

## 2016-08-24 ENCOUNTER — Encounter (HOSPITAL_COMMUNITY): Payer: Self-pay | Admitting: Certified Registered Nurse Anesthetist

## 2016-08-24 ENCOUNTER — Encounter (HOSPITAL_COMMUNITY)
Admission: EM | Disposition: A | Discharge status: Home / Self Care | Payer: Self-pay | Source: Home / Self Care | Attending: Emergency Medicine

## 2016-08-24 DIAGNOSIS — I10 Essential (primary) hypertension: Secondary | ICD-10-CM | POA: Diagnosis not present

## 2016-08-24 DIAGNOSIS — Z7982 Long term (current) use of aspirin: Secondary | ICD-10-CM | POA: Diagnosis not present

## 2016-08-24 DIAGNOSIS — E119 Type 2 diabetes mellitus without complications: Secondary | ICD-10-CM

## 2016-08-24 DIAGNOSIS — I252 Old myocardial infarction: Secondary | ICD-10-CM | POA: Diagnosis not present

## 2016-08-24 DIAGNOSIS — K219 Gastro-esophageal reflux disease without esophagitis: Secondary | ICD-10-CM

## 2016-08-24 DIAGNOSIS — Z79899 Other long term (current) drug therapy: Secondary | ICD-10-CM | POA: Diagnosis not present

## 2016-08-24 DIAGNOSIS — Z941 Heart transplant status: Secondary | ICD-10-CM

## 2016-08-24 DIAGNOSIS — Z794 Long term (current) use of insulin: Secondary | ICD-10-CM

## 2016-08-24 DIAGNOSIS — K4090 Unilateral inguinal hernia, without obstruction or gangrene, not specified as recurrent: Secondary | ICD-10-CM | POA: Diagnosis not present

## 2016-08-24 DIAGNOSIS — Z8546 Personal history of malignant neoplasm of prostate: Secondary | ICD-10-CM | POA: Diagnosis not present

## 2016-08-24 DIAGNOSIS — I2510 Atherosclerotic heart disease of native coronary artery without angina pectoris: Secondary | ICD-10-CM | POA: Diagnosis not present

## 2016-08-24 HISTORY — PX: INGUINAL HERNIA REPAIR: SHX194

## 2016-08-24 LAB — GLUCOSE, CAPILLARY
GLUCOSE-CAPILLARY: 82 mg/dL (ref 65–99)
GLUCOSE-CAPILLARY: 95 mg/dL (ref 65–99)
Glucose-Capillary: 90 mg/dL (ref 65–99)
Glucose-Capillary: 91 mg/dL (ref 65–99)
Glucose-Capillary: 129 mg/dL — ABNORMAL HIGH (ref 65–99)

## 2016-08-24 SURGERY — REPAIR, HERNIA, INGUINAL, LAPAROSCOPIC
Anesthesia: General | Site: Abdomen | Laterality: Right

## 2016-08-24 MED ORDER — FENTANYL CITRATE (PF) 100 MCG/2ML IJ SOLN
INTRAMUSCULAR | Status: AC
  Filled 2016-08-24: qty 2

## 2016-08-24 MED ORDER — PROPOFOL 10 MG/ML IV BOLUS
INTRAVENOUS | Status: DC | PRN
  Administered 2016-08-24: 150 mg via INTRAVENOUS

## 2016-08-24 MED ORDER — ACETAMINOPHEN 325 MG PO TABS: 650.0000 mg | ORAL_TABLET | ORAL | Status: DC | PRN

## 2016-08-24 MED ORDER — MORPHINE SULFATE (PF) 4 MG/ML IV SOLN
2.0000 mg | INTRAVENOUS | Status: DC | PRN
  Administered 2016-08-24 – 2016-08-25 (×3): 2 mg via INTRAVENOUS
  Filled 2016-08-24 (×3): qty 1

## 2016-08-24 MED ORDER — PHENYLEPHRINE HCL 10 MG/ML IJ SOLN
INTRAMUSCULAR | Status: DC | PRN
  Administered 2016-08-24: 80 ug via INTRAVENOUS

## 2016-08-24 MED ORDER — 0.9 % SODIUM CHLORIDE (POUR BTL) OPTIME
TOPICAL | Status: DC | PRN
  Administered 2016-08-24: 1000 mL

## 2016-08-24 MED ORDER — ACETAMINOPHEN 10 MG/ML IV SOLN
INTRAVENOUS | Status: DC | PRN
  Administered 2016-08-24: 1000 mg via INTRAVENOUS

## 2016-08-24 MED ORDER — DEXAMETHASONE SODIUM PHOSPHATE 10 MG/ML IJ SOLN
INTRAMUSCULAR | Status: AC
  Filled 2016-08-24: qty 1

## 2016-08-24 MED ORDER — FENTANYL CITRATE (PF) 250 MCG/5ML IJ SOLN
INTRAMUSCULAR | Status: AC
  Filled 2016-08-24: qty 5

## 2016-08-24 MED ORDER — LACTATED RINGERS IV SOLN
INTRAVENOUS | Status: DC
  Administered 2016-08-24 (×3): via INTRAVENOUS

## 2016-08-24 MED ORDER — HYDROCODONE-ACETAMINOPHEN 7.5-325 MG PO TABS: 1.0000 | ORAL_TABLET | Freq: Once | ORAL | Status: DC | PRN

## 2016-08-24 MED ORDER — BUPIVACAINE HCL (PF) 0.25 % IJ SOLN
INTRAMUSCULAR | Status: AC
  Filled 2016-08-24: qty 30

## 2016-08-24 MED ORDER — SODIUM CHLORIDE 0.9% FLUSH: 3.0000 mL | Freq: Two times a day (BID) | INTRAVENOUS | Status: DC

## 2016-08-24 MED ORDER — FENTANYL CITRATE (PF) 100 MCG/2ML IJ SOLN
INTRAMUSCULAR | Status: DC | PRN
  Administered 2016-08-24: 100 ug via INTRAVENOUS
  Administered 2016-08-24: 150 ug via INTRAVENOUS
  Administered 2016-08-24 (×2): 50 ug via INTRAVENOUS

## 2016-08-24 MED ORDER — SODIUM CHLORIDE 0.9% FLUSH: 3.0000 mL | INTRAVENOUS | Status: DC | PRN

## 2016-08-24 MED ORDER — OXYCODONE HCL 5 MG PO TABS: 5.0000 mg | ORAL_TABLET | ORAL | Status: DC | PRN

## 2016-08-24 MED ORDER — MIDAZOLAM HCL 2 MG/2ML IJ SOLN
INTRAMUSCULAR | Status: AC
  Filled 2016-08-24: qty 2

## 2016-08-24 MED ORDER — PHENYLEPHRINE 40 MCG/ML (10ML) SYRINGE FOR IV PUSH (FOR BLOOD PRESSURE SUPPORT)
PREFILLED_SYRINGE | INTRAVENOUS | Status: AC
  Filled 2016-08-24: qty 10

## 2016-08-24 MED ORDER — SODIUM CHLORIDE 0.9 % IV SOLN: 250.0000 mL | INTRAVENOUS | Status: DC | PRN

## 2016-08-24 MED ORDER — ACETAMINOPHEN 650 MG RE SUPP: 650.0000 mg | RECTAL | Status: DC | PRN

## 2016-08-24 MED ORDER — PROPOFOL 10 MG/ML IV BOLUS
INTRAVENOUS | Status: AC
  Filled 2016-08-24: qty 20

## 2016-08-24 MED ORDER — PROMETHAZINE HCL 25 MG/ML IJ SOLN: 6.2500 mg | INTRAMUSCULAR | Status: DC | PRN

## 2016-08-24 MED ORDER — ACETAMINOPHEN 10 MG/ML IV SOLN
INTRAVENOUS | Status: AC
  Filled 2016-08-24: qty 100

## 2016-08-24 MED ORDER — CEFAZOLIN SODIUM-DEXTROSE 2-4 GM/100ML-% IV SOLN
INTRAVENOUS | Status: AC
  Filled 2016-08-24: qty 100

## 2016-08-24 MED ORDER — FENTANYL CITRATE (PF) 100 MCG/2ML IJ SOLN
25.0000 ug | INTRAMUSCULAR | Status: DC | PRN
  Administered 2016-08-24: 50 ug via INTRAVENOUS
  Administered 2016-08-24 (×2): 25 ug via INTRAVENOUS

## 2016-08-24 MED ORDER — SUCCINYLCHOLINE CHLORIDE 20 MG/ML IJ SOLN
INTRAMUSCULAR | Status: DC | PRN
  Administered 2016-08-24: 100 mg via INTRAVENOUS

## 2016-08-24 MED ORDER — ONDANSETRON HCL 4 MG/2ML IJ SOLN
INTRAMUSCULAR | Status: AC
  Filled 2016-08-24: qty 2

## 2016-08-24 MED ORDER — ONDANSETRON HCL 4 MG/2ML IJ SOLN
INTRAMUSCULAR | Status: DC | PRN
Start: 2016-08-24 — End: 2016-08-24
  Administered 2016-08-24: 4 mg via INTRAVENOUS

## 2016-08-24 MED ORDER — LIDOCAINE 2% (20 MG/ML) 5 ML SYRINGE
INTRAMUSCULAR | Status: AC
  Filled 2016-08-24: qty 5

## 2016-08-24 MED ORDER — MEPERIDINE HCL 25 MG/ML IJ SOLN: 6.2500 mg | INTRAMUSCULAR | Status: DC | PRN

## 2016-08-24 MED ORDER — LIDOCAINE HCL (CARDIAC) 20 MG/ML IV SOLN
INTRAVENOUS | Status: DC | PRN
  Administered 2016-08-24: 80 mg via INTRAVENOUS

## 2016-08-24 MED ORDER — BUPIVACAINE HCL 0.25 % IJ SOLN
INTRAMUSCULAR | Status: DC | PRN
  Administered 2016-08-24: 4 mL

## 2016-08-24 MED ORDER — TACROLIMUS 1 MG PO CAPS
1.0000 mg | ORAL_CAPSULE | Freq: Two times a day (BID) | ORAL | Status: DC
  Administered 2016-08-24: 1 mg via ORAL
  Filled 2016-08-24 (×2): qty 1

## 2016-08-24 MED ORDER — SUGAMMADEX SODIUM 200 MG/2ML IV SOLN
INTRAVENOUS | Status: AC
  Filled 2016-08-24: qty 4

## 2016-08-24 MED ORDER — MIDAZOLAM HCL 5 MG/5ML IJ SOLN
INTRAMUSCULAR | Status: DC | PRN
  Administered 2016-08-24: 2 mg via INTRAVENOUS

## 2016-08-24 MED ORDER — OXYCODONE-ACETAMINOPHEN 5-325 MG PO TABS: 1.0000 | ORAL_TABLET | Freq: Four times a day (QID) | ORAL | 0 refills | Status: DC | PRN

## 2016-08-24 MED ORDER — ROCURONIUM BROMIDE 10 MG/ML (PF) SYRINGE
PREFILLED_SYRINGE | INTRAVENOUS | Status: AC
  Filled 2016-08-24: qty 5

## 2016-08-24 SURGICAL SUPPLY — 52 items
ADH SKN CLS LQ APL DERMABOND (GAUZE/BANDAGES/DRESSINGS) ×1
APL SKNCLS STERI-STRIP NONHPOA (GAUZE/BANDAGES/DRESSINGS) ×1
APPLIER CLIP 5 13 M/L LIGAMAX5 (MISCELLANEOUS)
APR CLP MED LRG 5 ANG JAW (MISCELLANEOUS)
BENZOIN TINCTURE PRP APPL 2/3 (GAUZE/BANDAGES/DRESSINGS) ×2 IMPLANT
CANISTER SUCT 3000ML PPV (MISCELLANEOUS) IMPLANT
CHLORAPREP W/TINT 26ML (MISCELLANEOUS) ×2 IMPLANT
CLIP APPLIE 5 13 M/L LIGAMAX5 (MISCELLANEOUS) IMPLANT
CLSR STERI-STRIP ANTIMIC 1/2X4 (GAUZE/BANDAGES/DRESSINGS) ×1 IMPLANT
COVER SURGICAL LIGHT HANDLE (MISCELLANEOUS) ×2 IMPLANT
DERMABOND ADHESIVE PROPEN (GAUZE/BANDAGES/DRESSINGS) ×1
DERMABOND ADVANCED .7 DNX6 (GAUZE/BANDAGES/DRESSINGS) IMPLANT
DEVICE TROCAR PUNCTURE CLOSURE (ENDOMECHANICALS) ×1 IMPLANT
DISSECTOR BLUNT TIP ENDO 5MM (MISCELLANEOUS) IMPLANT
ELECT REM PT RETURN 9FT ADLT (ELECTROSURGICAL) ×2
ELECTRODE REM PT RTRN 9FT ADLT (ELECTROSURGICAL) ×1 IMPLANT
GAUZE SPONGE 2X2 8PLY STRL LF (GAUZE/BANDAGES/DRESSINGS) ×1 IMPLANT
GLOVE BIO SURGEON STRL SZ7.5 (GLOVE) ×3 IMPLANT
GOWN STRL REUS W/ TWL LRG LVL3 (GOWN DISPOSABLE) ×2 IMPLANT
GOWN STRL REUS W/ TWL XL LVL3 (GOWN DISPOSABLE) ×1 IMPLANT
GOWN STRL REUS W/TWL LRG LVL3 (GOWN DISPOSABLE) ×4
GOWN STRL REUS W/TWL XL LVL3 (GOWN DISPOSABLE) ×4
KIT BASIN OR (CUSTOM PROCEDURE TRAY) ×2 IMPLANT
KIT ROOM TURNOVER OR (KITS) ×2 IMPLANT
MESH 3DMAX 4X6 RT LRG (Mesh General) ×1 IMPLANT
NDL INSUFFLATION 14GA 120MM (NEEDLE) IMPLANT
NEEDLE INSUFFLATION 14GA 120MM (NEEDLE) IMPLANT
NS IRRIG 1000ML POUR BTL (IV SOLUTION) ×2 IMPLANT
PAD ARMBOARD 7.5X6 YLW CONV (MISCELLANEOUS) ×4 IMPLANT
RELOAD STAPLE 4.0 BLU F/HERNIA (INSTRUMENTS) ×1 IMPLANT
RELOAD STAPLE 4.8 BLK F/HERNIA (STAPLE) IMPLANT
RELOAD STAPLE HERNIA 4.0 BLUE (INSTRUMENTS) ×2 IMPLANT
RELOAD STAPLE HERNIA 4.8 BLK (STAPLE) IMPLANT
SCISSORS LAP 5X35 DISP (ENDOMECHANICALS) ×2 IMPLANT
SET IRRIG TUBING LAPAROSCOPIC (IRRIGATION / IRRIGATOR) IMPLANT
SET TROCAR LAP APPLE-HUNT 5MM (ENDOMECHANICALS) ×3 IMPLANT
SPONGE GAUZE 2X2 STER 10/PKG (GAUZE/BANDAGES/DRESSINGS) ×1
STAPLER HERNIA 12 8.5 360D (INSTRUMENTS) ×2 IMPLANT
STRIP CLOSURE SKIN 1/2X4 (GAUZE/BANDAGES/DRESSINGS) ×2 IMPLANT
SUT MNCRL AB 4-0 PS2 18 (SUTURE) ×3 IMPLANT
SUT VIC AB 1 CT1 27 (SUTURE) ×2
SUT VIC AB 1 CT1 27XBRD ANBCTR (SUTURE) IMPLANT
SUT VICRYL 0 UR6 27IN ABS (SUTURE) ×1 IMPLANT
SYRINGE TOOMEY DISP (SYRINGE) ×2 IMPLANT
TAPE CLOTH SURG 4X10 WHT LF (GAUZE/BANDAGES/DRESSINGS) ×1 IMPLANT
TOWEL OR 17X24 6PK STRL BLUE (TOWEL DISPOSABLE) ×2 IMPLANT
TOWEL OR 17X26 10 PK STRL BLUE (TOWEL DISPOSABLE) ×2 IMPLANT
TRAY FOLEY CATH SILVER 14FR (SET/KITS/TRAYS/PACK) ×2 IMPLANT
TRAY LAPAROSCOPIC MC (CUSTOM PROCEDURE TRAY) ×2 IMPLANT
TROCAR XCEL 12X100 BLDLESS (ENDOMECHANICALS) ×2 IMPLANT
TUBING INSUFFLATION (TUBING) ×2 IMPLANT
WATER STERILE IRR 1000ML POUR (IV SOLUTION) ×2 IMPLANT

## 2016-08-24 NOTE — Progress Notes (Signed)
PROGRESS NOTE                                                                                                                                                                                                             Patient Demographics:    John [name], is a 66 y.o. male, DOB - 03/09/1951, CXK:[id number]  Admit date - 08/23/2016   Admitting Physician Md Edison Pace, MD  Outpatient Primary MD for the patient is Mayra Neer, MD  LOS - 0  Outpatient Specialists: Followed at [facility].  Chief Complaint  Patient presents with  . Inguinal Hernia       Brief Narrative   66 y.o. male prostate cancer, DM, GERD, HTN, MI s/p cardiac transplant x2. Followed by [facility] cardiothoracic surgery, patient with new right inguinal hernia presented 2-3 weeks ago, progressively painful, seen by surgical team, plan for surgical repair today, hospitalists were consulted for medical management.   Subjective:    John [name] today has, No headache, No chest pain, No fever, no shortness of breath   Assessment  & Plan :    Active Problems:   Hernia, inguinal, right   Diabetes mellitus with insulin therapy ([place])   Heart transplant recipient [facility])   GERD (gastroesophageal reflux disease)   Essential hypertension   Insomnia   R Inguinal Hernia - Management per primary surgical team, plan for operative repair today  Diabetes - Appears to be well controlled on insulin pump, -  Removed pump during hospital stay, CBG acceptable on insulin sliding scale well controlled on insulin pump.    Heart transplant x2.  - Last transplant failed after stopping his cellcept (done at the request of his urologist for surgical procedure). - Followed by [facility] and states OK for surgery - continue Cellcept and Prograf - Continue ASA, Statin   HTN: - continue lisinopril  Depression: - continue celexa  GERD: - continue ppi  Insomnia: - continue melatonin     Code Status :  Full  Family Communication  : None at bedside  Procedures  : None  DVT Prophylaxis  :   SCDs , will start subcutaneous heparin postoperatively  Lab Results  Component Value Date   PLT 173 08/23/2016    Antibiotics  :    Anti-infectives    Start     Dose/Rate Route Frequency Ordered Stop  08/24/16 1200  ceFAZolin (ANCEF) IVPB 2g/100 mL premix     2 g 200 mL/hr over 30 Minutes Intravenous On call to O.R. 08/23/16 1604 08/25/16 0559        Objective:   Vitals:   08/23/16 1738 08/23/16 2246 08/24/16 0541 08/24/16 0859  BP: 134/83 129/84 121/69 132/83  Pulse: 86 82 78   Resp: 17 17 17    Temp: 98.3 F (36.8 C) 98.7 F (37.1 C) 98 F (36.7 C)   TempSrc: Oral Oral Oral   SpO2: 98% 98% 95%   Weight: 67.6 kg (149 lb 0.5 oz)     Height: 5\' 6"  (1.676 m)       Wt Readings from Last 3 Encounters:  08/23/16 67.6 kg (149 lb 0.5 oz)  06/12/15 76.2 kg (168 lb)  02/20/15 80.3 kg (177 lb)     Intake/Output Summary (Last 24 hours) at 08/24/16 1124 Last data filed at 08/24/16 0540  Gross per 24 hour  Intake            882.5 ml  Output             1175 ml  Net           -292.5 ml     Physical Exam  Awake Alert, Oriented X 3,  Supple Neck,No JVD, No cervical lymphadenopathy appriciated.  Symmetrical Chest wall movement, Good air movement bilaterally, CTAB RRR,No Gallops,Rubs or new Murmurs, No Parasternal Heave +ve B.Sounds, Abd Soft, Minimal tenderness in right lower groin area,No rebound - guarding or rigidity. No Cyanosis, Clubbing or edema, No new Rash or bruise      Data Review:    CBC  Recent Labs Lab 08/23/16 1610  WBC 6.4  HGB 16.5  HCT 49.1  PLT 173  MCV 86.9  MCH 29.2  MCHC 33.6  RDW 13.5  LYMPHSABS 1.2  MONOABS 0.5  EOSABS 0.2  BASOSABS 0.0    Chemistries   Recent Labs Lab 08/23/16 1610  NA 140  K 3.8  CL 106  CO2 26  GLUCOSE 121*  BUN 18  CREATININE 0.88  CALCIUM 8.9  AST 22  ALT 23  ALKPHOS 62  BILITOT 0.9    ------------------------------------------------------------------------------------------------------------------ No results for input(s): CHOL, HDL, LDLCALC, TRIG, CHOLHDL, LDLDIRECT in the last 72 hours.  Lab Results  Component Value Date   HGBA1C 7.8 02/20/2015   ------------------------------------------------------------------------------------------------------------------ No results for input(s): TSH, T4TOTAL, T3FREE, THYROIDAB in the last 72 hours.  Invalid input(s): FREET3 ------------------------------------------------------------------------------------------------------------------ No results for input(s): VITAMINB12, FOLATE, FERRITIN, TIBC, IRON, RETICCTPCT in the last 72 hours.  Coagulation profile  Recent Labs Lab 08/23/16 1610  INR 1.06    No results for input(s): DDIMER in the last 72 hours.  Cardiac Enzymes No results for input(s): CKMB, TROPONINI, MYOGLOBIN in the last 168 hours.  Invalid input(s): CK ------------------------------------------------------------------------------------------------------------------ No results found for: BNP  Inpatient Medications  Scheduled Meds: . aspirin EC  81 mg Oral Daily  . citalopram  20 mg Oral Daily  . Eluxadoline  100 mg Oral BID  . insulin aspart  0-5 Units Subcutaneous QHS  . insulin aspart  0-9 Units Subcutaneous TID WC  . lisinopril  10 mg Oral Daily  . mycophenolate  1,500 mg Oral Q12H  . pantoprazole  40 mg Oral Daily  . rosuvastatin  10 mg Oral Daily  . tacrolimus  1 mg Oral BID   Continuous Infusions: . sodium chloride 75 mL/hr at 08/24/16 1011  .  ceFAZolin (ANCEF) IV  PRN Meds:.acetaminophen **OR** acetaminophen, diphenhydrAMINE **OR** diphenhydrAMINE, HYDROcodone-acetaminophen, ibuprofen, ondansetron **OR** ondansetron (ZOFRAN) IV  Micro Results Recent Results (from the past 240 hour(s))  Surgical pcr screen     Status: None   Collection Time: 08/23/16  6:21 PM  Result Value Ref  Range Status   MRSA, PCR NEGATIVE NEGATIVE Final   Staphylococcus aureus NEGATIVE NEGATIVE Final    Comment:        The Xpert SA Assay (FDA approved for NASAL specimens in patients over 50 years of age), is one component of a comprehensive surveillance program.  Test performance has been validated by [facility] for patients greater than or equal to 49 year old. It is not intended to diagnose infection nor to guide or monitor treatment.     Radiology Reports No results found.   [facility], Yazhini Mcaulay M.D on 08/24/2016 at 11:24 AM  Between 7am to 7pm - Pager - [phone]  After 7pm go to [email or web address] - password [facility]  [facility] -  Office  [phone]

## 2016-08-24 NOTE — Anesthesia Preprocedure Evaluation (Signed)
Anesthesia Evaluation  Patient identified by MRN, date of birth, ID band Patient awake    Reviewed: Allergy & Precautions, NPO status , Patient's Chart, lab work & pertinent test results  History of Anesthesia Complications (+) DIFFICULT AIRWAY and history of anesthetic complications  Airway Mallampati: III  TM Distance: <3 FB Neck ROM: Full    Dental no notable dental hx. (+) Teeth Intact   Pulmonary former smoker,    Pulmonary exam normal breath sounds clear to auscultation       Cardiovascular hypertension, Pt. on medications + CAD and + Past MI  Normal cardiovascular exam Rhythm:Regular Rate:Normal  S/P Cardiac transplant x 2   Neuro/Psych negative neurological ROS  negative psych ROS   GI/Hepatic Neg liver ROS, GERD  Medicated and Controlled,  Endo/Other  diabetes, Well Controlled, Type 2, Insulin Dependent, Oral Hypoglycemic AgentsHyperlipidemia  Renal/GU negative Renal ROS  negative genitourinary   Musculoskeletal   Abdominal   Peds  Hematology On Cellcept   Anesthesia Other Findings   Reproductive/Obstetrics                             Anesthesia Physical Anesthesia Plan  ASA: III  Anesthesia Plan: General   Post-op Pain Management:    Induction: Intravenous  Airway Management Planned: Oral ETT and Video Laryngoscope Planned  Additional Equipment:   Intra-op Plan:   Post-operative Plan: Extubation in OR  Informed Consent: I have reviewed the patients History and Physical, chart, labs and discussed the procedure including the risks, benefits and alternatives for the proposed anesthesia with the patient or authorized representative who has indicated his/her understanding and acceptance.   Dental advisory given  Plan Discussed with: Anesthesiologist, CRNA and Surgeon  Anesthesia Plan Comments:         Anesthesia Quick Evaluation

## 2016-08-24 NOTE — Progress Notes (Signed)
Patient arrived back to 6n09, alert and oriented just slightly drowsy, R groin gauze dressing noted, IV fluids running. Patient in mild pain, having some nausea will medicate per orders. Family at bedside, will continue to monitor. Telemetry re-applied.

## 2016-08-24 NOTE — Discharge Instructions (Signed)
[facility] _______[facility] °INGUINAL HERNIA REPAIR: POST OP INSTRUCTIONS ° °Always review your discharge instruction sheet given to you by the facility where your surgery was performed. °IF YOU HAVE DISABILITY OR FAMILY LEAVE FORMS, YOU MUST BRING THEM TO THE OFFICE FOR PROCESSING.   °DO NOT GIVE THEM TO YOUR DOCTOR. ° °1. A  prescription for pain medication may be given to you upon discharge.  Take your pain medication as prescribed, if needed.  If narcotic pain medicine is not needed, then you may take acetaminophen (Tylenol) or ibuprofen (Advil) as needed. °2. Take your usually prescribed medications unless otherwise directed. °If you need a refill on your pain medication, please contact your pharmacy.  They will contact our office to request authorization. Prescriptions will not be filled after 5 pm or on week-ends. °3. You should follow a light diet the first 24 hours after arrival home, such as soup and crackers, etc.  Be sure to include lots of fluids daily.  Resume your normal diet the day after surgery. °4.Most patients will experience some swelling and bruising around the umbilicus or in the groin and scrotum.  Ice packs and reclining will help.  Swelling and bruising can take several days to resolve.  °6. It is common to experience some constipation if taking pain medication after surgery.  Increasing fluid intake and taking a stool softener (such as Colace) will usually help or prevent this problem from occurring.  A mild laxative (Milk of Magnesia or Miralax) should be taken according to package directions if there are no bowel movements after 48 hours. °7. Unless discharge instructions indicate otherwise, you may remove your bandages 24-48 hours after surgery, and you may shower at that time.  You may have steri-strips (small skin tapes) in place directly over the incision.  These strips should be left on the skin for 7-10 days.  If your surgeon used skin glue on the incision, you may  shower in 24 hours.  The glue will flake off over the next 2-3 weeks.  Any sutures or staples will be removed at the office during your follow-up visit. °8. ACTIVITIES:  You may resume regular (light) daily activities beginning the next day--such as daily self-care, walking, climbing stairs--gradually increasing activities as tolerated.  You may have sexual intercourse when it is comfortable.  Refrain from any heavy lifting or straining until approved by your doctor. ° °a.You may drive when you are no longer taking prescription pain medication, you can comfortably wear a seatbelt, and you can safely maneuver your car and apply brakes. °b.RETURN TO WORK:   °_____________________________________________ ° °9.You should see your doctor in the office for a follow-up appointment approximately 2-3 weeks after your surgery.  Make sure that you call for this appointment within a day or two after you arrive home to insure a convenient appointment time. °10.OTHER INSTRUCTIONS: _________________________ °   _____________________________________ ° °WHEN TO CALL YOUR DOCTOR: °1. Fever over 101.0 °2. Inability to urinate °3. Nausea and/or vomiting °4. Extreme swelling or bruising °5. Continued bleeding from incision. °6. Increased pain, redness, or drainage from the incision ° °The clinic staff is available to answer your questions during regular business hours.  Please don’t hesitate to call and ask to speak to one of the nurses for clinical concerns.  If you have a medical emergency, go to the nearest emergency room or call 911.  A surgeon from [facility] is always on call at the hospital ° ° °[address], [address], [place], [place]  [address] ? ° [address] [address], [place], [place]   [address] °[phone] ? [phone] ? FAX [phone] °Web site: [email or web address] ° °

## 2016-08-24 NOTE — Progress Notes (Signed)
Patient transferred down to surgery, pre-op checklist complete, report called to short stay. Family at bedside, patient notified he may not be returning to room, wife gathered all belongs and taken with them.

## 2016-08-24 NOTE — Anesthesia Procedure Notes (Signed)
Procedure Name: Intubation Date/Time: 08/24/2016 4:31 PM Performed by: Ollen Bowl Pre-anesthesia Checklist: Patient identified, Suction available, Emergency Drugs available, Patient being monitored and Timeout performed Patient Re-evaluated:Patient Re-evaluated prior to inductionOxygen Delivery Method: Circle system utilized and Simple face mask Preoxygenation: Pre-oxygenation with 100% oxygen Intubation Type: IV induction Ventilation: Mask ventilation without difficulty Laryngoscope Size: Glidescope and 3 Grade View: Grade II Tube type: Oral Tube size: 7.5 mm Number of attempts: 1 Airway Equipment and Method: Patient positioned with wedge pillow,  Stylet and Video-laryngoscopy Placement Confirmation: ETT inserted through vocal cords under direct vision,  positive ETCO2 and breath sounds checked- equal and bilateral Secured at: 22 cm Tube secured with: Tape Dental Injury: Teeth and Oropharynx as per pre-operative assessment

## 2016-08-24 NOTE — Transfer of Care (Signed)
Immediate Anesthesia Transfer of Care Note  Patient: John [name]  Procedure(s) Performed: Procedure(s): LAPAROSCOPIC  RIGHT INGUINAL HERNIA WITH MESH (Right)  Patient Location: PACU  Anesthesia Type:General  Level of Consciousness: awake, alert  and oriented  Airway & Oxygen Therapy: Patient Spontanous Breathing and Patient connected to nasal cannula oxygen  Post-op Assessment: Report given to RN and Post -op Vital signs reviewed and stable  Post vital signs: Reviewed and stable  Last Vitals:  Vitals:   08/24/16 0541 08/24/16 0859  BP: 121/69 132/83  Pulse: 78   Resp: 17   Temp: 36.7 C     Last Pain:  Vitals:   08/24/16 1803  TempSrc:   PainSc: Asleep         Complications: No apparent anesthesia complications

## 2016-08-24 NOTE — Op Note (Signed)
08/24/2016   PATIENT:  John [name]  66 y.o. male  PRE-OPERATIVE DIAGNOSIS:  RIGHT INGUINAL HERNIA  POST-OPERATIVE DIAGNOSIS:  INDIRECT RIGHT INGUINAL HERNIA  PROCEDURE:  Procedure(s): LAPAROSCOPIC  RIGHT INGUINAL HERNIA WITH MESH (Right)  SURGEON:  Surgeon(s) and Role:    * Ralene Ok, MD - Primary  ANESTHESIA:   local and general  EBL:  5cc Total I/O In: 0  Out: 500 [Urine:500]  BLOOD ADMINISTERED:none  DRAINS: none   LOCAL MEDICATIONS USED:  BUPIVICAINE   SPECIMEN:  No Specimen  DISPOSITION OF SPECIMEN:  N/A  COUNTS:  YES  TOURNIQUET:  * No tourniquets in log *  DICTATION: .Dragon Dictation   Counts: reported as correct x 2  Findings:  The patient had a Larger right indirect hernia  Indications for procedure:  The patient is a 66 year old male with a right hernia for several months. Patient complained of symptomatology to his right inguinal area. The patient was taken back for inguinal hernia repair.  Details of the procedure: The patient was taken back to the operating room. The patient was placed in supine position with bilateral SCDs in place.  The patient was prepped and draped in the usual sterile fashion.  After appropriate anitbiotics were confirmed, a time-out was confirmed and all facts were verified.  0.25% Marcaine was used to infiltrate the umbilical area. A 11-blade was used to cut down the skin and blunt dissection was used to get the anterior fashion.  The anterior fascia was incised approximately 1 cm and the muscles were retracted laterally. Blunt dissection was then used to create a space in the preperitoneal area. At this time a 10 mm camera was then introduced into the space and advanced the pubic tubercle and a 12 mm trocar was placed over this and insufflation was started.  At this time and space was created from medial to laterally the preperitoneal space.  Cooper's ligament was initially cleaned off.  The hernia sac was identified in the  indirect space. Dissection of the spermatic cord was undertaken the vas deferens was identified and protected in all parts of the case.    Once the hernia sac was taken down to approximately the umbilicus a Bard 3D Max mesh, size: Large, was  introduced into the preperitoneal space.  The mesh was brought over to cover the direct and indirect hernia spaces.  This was anchored into place and secured to Cooper's ligament with 4.89mm staples from a [place] hernia stapler. It was anchored to the anterior abdominal wall with 4.8 mm staples. The hernia sac was seen lying posterior to the mesh. There was no staples placed laterally. The insufflation was evacuated and the peritoneum was seen posterior to the mesh. The trochars were removed. The anterior fascia was reapproximated using #1 Vicryl on a UR- 6.  Intra-abdominal air was evacuated and the Veress needle removed. The skin was reapproximated using 4-0 Monocryl subcuticular fashion the patient was awakened from general anesthesia and taken to recovery in stable condition.   PLAN OF CARE: Discharge to home after PACU  PATIENT DISPOSITION:  PACU - hemodynamically stable.   Delay start of Pharmacological VTE agent (>24hrs) due to surgical blood loss or risk of bleeding: not applicable

## 2016-08-25 ENCOUNTER — Encounter (HOSPITAL_COMMUNITY): Payer: Self-pay | Admitting: General Surgery

## 2016-08-25 LAB — CBC
HCT: 47.5 % (ref 39.0–52.0)
Hemoglobin: 15.7 g/dL (ref 13.0–17.0)
MCH: 28.6 pg (ref 26.0–34.0)
MCHC: 33.1 g/dL (ref 30.0–36.0)
MCV: 86.7 fL (ref 78.0–100.0)
PLATELETS: 158 10*3/uL (ref 150–400)
RBC: 5.48 MIL/uL (ref 4.22–5.81)
RDW: 13.4 % (ref 11.5–15.5)
WBC: 6.5 10*3/uL (ref 4.0–10.5)

## 2016-08-25 LAB — BASIC METABOLIC PANEL
Anion gap: 5 (ref 5–15)
BUN: 10 mg/dL (ref 6–20)
CALCIUM: 8.3 mg/dL — AB (ref 8.9–10.3)
CO2: 28 mmol/L (ref 22–32)
CREATININE: 0.91 mg/dL (ref 0.61–1.24)
Chloride: 105 mmol/L (ref 101–111)
GFR calc Af Amer: >60 mL/min (ref >60)
GLUCOSE: 130 mg/dL — AB (ref 65–99)
Potassium: 3.7 mmol/L (ref 3.5–5.1)
Sodium: 138 mmol/L (ref 135–145)

## 2016-08-25 LAB — GLUCOSE, CAPILLARY: Glucose-Capillary: 123 mg/dL — ABNORMAL HIGH (ref 65–99)

## 2016-08-25 MED ORDER — OXYCODONE HCL 5 MG PO TABS: 5.0000 mg | ORAL_TABLET | ORAL | 0 refills | Status: DC | PRN

## 2016-08-25 MED ORDER — ACETAMINOPHEN 325 MG PO TABS: 650.0000 mg | ORAL_TABLET | Freq: Four times a day (QID) | ORAL | Status: DC | PRN

## 2016-08-25 NOTE — Progress Notes (Signed)
Notified patient he had discharge orders so we would get those together and he wanted to take his meds when he got home.

## 2016-08-25 NOTE — Discharge Summary (Signed)
[facility] Discharge Summary   Patient ID: John [name] MRN: [id number] DOB/AGE: May 14, 1950 66 y.o.  Admit date: 08/23/2016 Discharge date: 08/25/2016  Discharge Diagnosis Patient Active Problem List   Diagnosis Date Noted  . Hernia, inguinal, right 08/23/2016  . Diabetes mellitus with insulin therapy ([place]) 08/23/2016  . Heart transplant recipient [facility]) 08/23/2016  . GERD (gastroesophageal reflux disease) 08/23/2016  . Essential hypertension 08/23/2016  . Insomnia 08/23/2016  . Diabetes mellitus, stable ([place]) 06/11/2014  . Nonspecific abnormal finding in stool contents 06/03/2013  . Generalized abdominal pain 06/03/2013  . Change in bowel habits 06/03/2013    Consultants [facility] hospitalists - Dr. Linna Darner   Imaging: None  Procedures Dr. Ralene Ok (08/24/16) - laparoscopic right inguinal hernia repair with mesh   Hospital Course:  66 y.o. Male with a PMH HTN, GERD, Hx MI, and heart transplant x2 who presented with R inguinal hernia that he noticed 2-3 weeks ago. Became progressively more painful and limited patients mobility. The patient was admitted and underwent the above procedure by Dr. Rosendo Gros. He tolerated the procedure well. On POD#1 the patient was clinically stable for discharge. He will follow up with Dr. Rosendo Gros in 3 weeks.   Physical Exam: General:  Alert, NAD, pleasant, comfortable Pulm: normal effort Abd:  Soft, appropriately tender, non-distended, trochar sites c/d/i  Allergies as of 08/25/2016      Reactions   Metformin And Related    Can't tolerate      Medication List    TAKE these medications   ACCU-CHEK FASTCLIX LANCETS Misc Use to check blood sugar 6 times per day dx code E11.9   acetaminophen 325 MG tablet Commonly known as:  TYLENOL Take 2 tablets (650 mg total) by mouth every 6 (six) hours as needed for mild pain (or Fever >/= 101).   aspirin 81 MG tablet Take 81 mg by mouth daily.   calcium carbonate 600 MG Tabs  tablet Commonly known as:  OS-CAL Take 600 mg by mouth 2 (two) times daily with a meal.   citalopram 20 MG tablet Commonly known as:  CELEXA Take 20 mg by mouth daily.   glucose blood test strip Commonly known as:  FREESTYLE TEST STRIPS Use as instructed to check blood sugar 6 times per day dx code E11.9   insulin lispro 100 UNIT/ML injection Commonly known as:  HUMALOG Use max of 70 units per day with insulin pump What changed:  how much to take  how to take this  when to take this  additional instructions   lisinopril 10 MG tablet Commonly known as:  PRINIVIL,ZESTRIL Take 10 mg by mouth daily.   magnesium oxide 400 MG tablet Commonly known as:  MAG-OX Take 400 mg by mouth daily.   MELATONIN PO Take 6 mg by mouth as needed.   mycophenolate 500 MG tablet Commonly known as:  CELLCEPT Take 1,500 mg by mouth 2 (two) times daily.   omeprazole 20 MG capsule Commonly known as:  PRILOSEC Take 20 mg by mouth daily.   OMNIPOD Misc Use every 3 days   oxyCODONE 5 MG immediate release tablet Commonly known as:  Oxy IR/ROXICODONE Take 1-2 tablets (5-10 mg total) by mouth every 4 (four) hours as needed for moderate pain.   rosuvastatin 10 MG tablet Commonly known as:  CRESTOR Take 10 mg by mouth daily.   tacrolimus 1 MG capsule Commonly known as:  PROGRAF Take 1 mg by mouth 2 (two) times daily.   VIBERZI 100  MG Tabs Generic drug:  Eluxadoline Take 100 mg by mouth 2 (two) times daily.       Follow-up Information    Ralene Ok, MD. Go on 09/15/2016.   Specialty:  General Surgery Why:  at 11:10 AM for post-operative follow-up. please arrive 30 minutes early to get checked in and fill out any necesaary paperwork. Contact information: [address] [place] [address] [phone]           Signed: Obie Dredge, [facility] 08/25/2016, 9:46 AM Pager: [phone] Consults: [phone] Mon-Fri 7:00 am-4:30  pm Sat-Sun 7:00 am-11:30 am

## 2016-08-25 NOTE — Anesthesia Postprocedure Evaluation (Signed)
Anesthesia Post Note  Patient: FERRIS FIELDEN  Procedure(s) Performed: Procedure(s) (LRB): LAPAROSCOPIC  RIGHT INGUINAL HERNIA WITH MESH (Right)  Patient location during evaluation: PACU Anesthesia Type: General Level of consciousness: awake and alert Pain management: pain level controlled Vital Signs Assessment: post-procedure vital signs reviewed and stable Respiratory status: spontaneous breathing, nonlabored ventilation and respiratory function stable Cardiovascular status: blood pressure returned to baseline and stable Postop Assessment: no signs of nausea or vomiting Anesthetic complications: no       Last Vitals:  Vitals:   08/25/16 0234 08/25/16 0638  BP: 118/71 122/75  Pulse: 81 78  Resp: 16 16  Temp: 36.9 C 36.9 C    Last Pain:  Vitals:   08/25/16 1031  TempSrc: Oral  PainSc:                  Lynda Rainwater

## 2016-08-25 NOTE — Progress Notes (Signed)
Shea Stakes to be D/C'd  per MD order. Discussed with the patient and all questions fully answered.  VSS, Skin clean, dry and intact without evidence of skin break down, no evidence of skin tears noted.  IV catheter discontinued intact. Site without signs and symptoms of complications. Dressing and pressure applied.  An After Visit Summary was printed and given to the patient. Patient received prescription.  D/c education completed with patient/family including follow up instructions, medication list, d/c activities limitations if indicated, with other d/c instructions as indicated by MD - patient able to verbalize understanding, all questions fully answered.   Patient instructed to return to ED, call 911, or call MD for any changes in condition.   Patient to be escorted via [place], and D/C home via private auto.

## 2016-08-31 DIAGNOSIS — E119 Type 2 diabetes mellitus without complications: Secondary | ICD-10-CM | POA: Diagnosis not present

## 2016-10-24 DIAGNOSIS — R9721 Rising PSA following treatment for malignant neoplasm of prostate: Secondary | ICD-10-CM | POA: Diagnosis not present

## 2016-11-21 DIAGNOSIS — H40013 Open angle with borderline findings, low risk, bilateral: Secondary | ICD-10-CM | POA: Diagnosis not present

## 2016-11-21 DIAGNOSIS — H47091 Other disorders of optic nerve, not elsewhere classified, right eye: Secondary | ICD-10-CM | POA: Diagnosis not present

## 2016-11-21 DIAGNOSIS — H21233 Degeneration of iris (pigmentary), bilateral: Secondary | ICD-10-CM | POA: Diagnosis not present

## 2016-11-22 DIAGNOSIS — E119 Type 2 diabetes mellitus without complications: Secondary | ICD-10-CM | POA: Diagnosis not present

## 2016-11-22 DIAGNOSIS — Z941 Heart transplant status: Secondary | ICD-10-CM | POA: Diagnosis not present

## 2016-11-22 DIAGNOSIS — I151 Hypertension secondary to other renal disorders: Secondary | ICD-10-CM | POA: Diagnosis not present

## 2016-11-22 DIAGNOSIS — Z48298 Encounter for aftercare following other organ transplant: Secondary | ICD-10-CM | POA: Diagnosis not present

## 2016-11-22 DIAGNOSIS — Z794 Long term (current) use of insulin: Secondary | ICD-10-CM | POA: Diagnosis not present

## 2016-11-22 DIAGNOSIS — N2889 Other specified disorders of kidney and ureter: Secondary | ICD-10-CM | POA: Diagnosis not present

## 2016-11-22 DIAGNOSIS — Z4821 Encounter for aftercare following heart transplant: Secondary | ICD-10-CM | POA: Diagnosis not present

## 2016-11-22 DIAGNOSIS — I517 Cardiomegaly: Secondary | ICD-10-CM | POA: Diagnosis not present

## 2016-11-24 DIAGNOSIS — E119 Type 2 diabetes mellitus without complications: Secondary | ICD-10-CM | POA: Diagnosis not present

## 2017-01-30 DIAGNOSIS — R9721 Rising PSA following treatment for malignant neoplasm of prostate: Secondary | ICD-10-CM | POA: Diagnosis not present

## 2017-01-31 DIAGNOSIS — E782 Mixed hyperlipidemia: Secondary | ICD-10-CM | POA: Diagnosis not present

## 2017-01-31 DIAGNOSIS — Z23 Encounter for immunization: Secondary | ICD-10-CM | POA: Diagnosis not present

## 2017-01-31 DIAGNOSIS — I10 Essential (primary) hypertension: Secondary | ICD-10-CM | POA: Diagnosis not present

## 2017-01-31 DIAGNOSIS — Z941 Heart transplant status: Secondary | ICD-10-CM | POA: Diagnosis not present

## 2017-01-31 DIAGNOSIS — E119 Type 2 diabetes mellitus without complications: Secondary | ICD-10-CM | POA: Diagnosis not present

## 2017-01-31 DIAGNOSIS — Z794 Long term (current) use of insulin: Secondary | ICD-10-CM | POA: Diagnosis not present

## 2017-01-31 DIAGNOSIS — R69 Illness, unspecified: Secondary | ICD-10-CM | POA: Diagnosis not present

## 2017-01-31 DIAGNOSIS — C61 Malignant neoplasm of prostate: Secondary | ICD-10-CM | POA: Diagnosis not present

## 2017-02-02 DIAGNOSIS — C61 Malignant neoplasm of prostate: Secondary | ICD-10-CM | POA: Diagnosis not present

## 2017-02-02 DIAGNOSIS — N5201 Erectile dysfunction due to arterial insufficiency: Secondary | ICD-10-CM | POA: Diagnosis not present

## 2017-02-21 DIAGNOSIS — E119 Type 2 diabetes mellitus without complications: Secondary | ICD-10-CM | POA: Diagnosis not present

## 2017-03-16 DIAGNOSIS — K589 Irritable bowel syndrome without diarrhea: Secondary | ICD-10-CM | POA: Diagnosis not present

## 2017-03-16 DIAGNOSIS — R197 Diarrhea, unspecified: Secondary | ICD-10-CM | POA: Diagnosis not present

## 2017-05-10 DIAGNOSIS — R69 Illness, unspecified: Secondary | ICD-10-CM | POA: Diagnosis not present

## 2017-05-19 DIAGNOSIS — E119 Type 2 diabetes mellitus without complications: Secondary | ICD-10-CM | POA: Diagnosis not present

## 2017-05-23 DIAGNOSIS — E119 Type 2 diabetes mellitus without complications: Secondary | ICD-10-CM | POA: Diagnosis not present

## 2017-05-23 DIAGNOSIS — I10 Essential (primary) hypertension: Secondary | ICD-10-CM | POA: Diagnosis not present

## 2017-05-23 DIAGNOSIS — Z125 Encounter for screening for malignant neoplasm of prostate: Secondary | ICD-10-CM | POA: Diagnosis not present

## 2017-05-23 DIAGNOSIS — I151 Hypertension secondary to other renal disorders: Secondary | ICD-10-CM | POA: Diagnosis not present

## 2017-05-23 DIAGNOSIS — Z941 Heart transplant status: Secondary | ICD-10-CM | POA: Diagnosis not present

## 2017-05-23 DIAGNOSIS — Z794 Long term (current) use of insulin: Secondary | ICD-10-CM | POA: Diagnosis not present

## 2017-05-23 DIAGNOSIS — Z4821 Encounter for aftercare following heart transplant: Secondary | ICD-10-CM | POA: Diagnosis not present

## 2017-05-23 DIAGNOSIS — C61 Malignant neoplasm of prostate: Secondary | ICD-10-CM | POA: Diagnosis not present

## 2017-05-23 DIAGNOSIS — Z79899 Other long term (current) drug therapy: Secondary | ICD-10-CM | POA: Diagnosis not present

## 2017-05-23 DIAGNOSIS — N2889 Other specified disorders of kidney and ureter: Secondary | ICD-10-CM | POA: Diagnosis not present

## 2017-05-23 DIAGNOSIS — R911 Solitary pulmonary nodule: Secondary | ICD-10-CM | POA: Diagnosis not present

## 2017-05-25 DIAGNOSIS — R911 Solitary pulmonary nodule: Secondary | ICD-10-CM | POA: Diagnosis not present

## 2017-05-25 DIAGNOSIS — Z941 Heart transplant status: Secondary | ICD-10-CM | POA: Diagnosis not present

## 2017-05-25 DIAGNOSIS — I2510 Atherosclerotic heart disease of native coronary artery without angina pectoris: Secondary | ICD-10-CM | POA: Diagnosis not present

## 2017-06-01 DIAGNOSIS — L0390 Cellulitis, unspecified: Secondary | ICD-10-CM | POA: Diagnosis not present

## 2017-07-13 DIAGNOSIS — Z941 Heart transplant status: Secondary | ICD-10-CM | POA: Diagnosis not present

## 2017-07-13 DIAGNOSIS — M2550 Pain in unspecified joint: Secondary | ICD-10-CM | POA: Diagnosis not present

## 2017-07-13 DIAGNOSIS — E119 Type 2 diabetes mellitus without complications: Secondary | ICD-10-CM | POA: Diagnosis not present

## 2017-07-13 DIAGNOSIS — Z794 Long term (current) use of insulin: Secondary | ICD-10-CM | POA: Diagnosis not present

## 2017-07-13 DIAGNOSIS — M25539 Pain in unspecified wrist: Secondary | ICD-10-CM | POA: Diagnosis not present

## 2017-07-20 DIAGNOSIS — H2513 Age-related nuclear cataract, bilateral: Secondary | ICD-10-CM | POA: Diagnosis not present

## 2017-07-20 DIAGNOSIS — E119 Type 2 diabetes mellitus without complications: Secondary | ICD-10-CM | POA: Diagnosis not present

## 2017-07-20 DIAGNOSIS — H25013 Cortical age-related cataract, bilateral: Secondary | ICD-10-CM | POA: Diagnosis not present

## 2017-07-20 DIAGNOSIS — H40013 Open angle with borderline findings, low risk, bilateral: Secondary | ICD-10-CM | POA: Diagnosis not present

## 2017-07-24 DIAGNOSIS — R69 Illness, unspecified: Secondary | ICD-10-CM | POA: Diagnosis not present

## 2017-07-27 DIAGNOSIS — Z0100 Encounter for examination of eyes and vision without abnormal findings: Secondary | ICD-10-CM | POA: Diagnosis not present

## 2017-07-28 DIAGNOSIS — Z8546 Personal history of malignant neoplasm of prostate: Secondary | ICD-10-CM | POA: Diagnosis not present

## 2017-08-10 DIAGNOSIS — N3040 Irradiation cystitis without hematuria: Secondary | ICD-10-CM | POA: Diagnosis not present

## 2017-08-10 DIAGNOSIS — N393 Stress incontinence (female) (male): Secondary | ICD-10-CM | POA: Diagnosis not present

## 2017-08-10 DIAGNOSIS — R9721 Rising PSA following treatment for malignant neoplasm of prostate: Secondary | ICD-10-CM | POA: Diagnosis not present

## 2017-08-11 DIAGNOSIS — K219 Gastro-esophageal reflux disease without esophagitis: Secondary | ICD-10-CM | POA: Diagnosis not present

## 2017-08-11 DIAGNOSIS — C61 Malignant neoplasm of prostate: Secondary | ICD-10-CM | POA: Diagnosis not present

## 2017-08-11 DIAGNOSIS — Z0000 Encounter for general adult medical examination without abnormal findings: Secondary | ICD-10-CM | POA: Diagnosis not present

## 2017-08-11 DIAGNOSIS — Z794 Long term (current) use of insulin: Secondary | ICD-10-CM | POA: Diagnosis not present

## 2017-08-11 DIAGNOSIS — Z941 Heart transplant status: Secondary | ICD-10-CM | POA: Diagnosis not present

## 2017-08-11 DIAGNOSIS — R69 Illness, unspecified: Secondary | ICD-10-CM | POA: Diagnosis not present

## 2017-08-11 DIAGNOSIS — I10 Essential (primary) hypertension: Secondary | ICD-10-CM | POA: Diagnosis not present

## 2017-08-11 DIAGNOSIS — E1169 Type 2 diabetes mellitus with other specified complication: Secondary | ICD-10-CM | POA: Diagnosis not present

## 2017-08-11 DIAGNOSIS — E782 Mixed hyperlipidemia: Secondary | ICD-10-CM | POA: Diagnosis not present

## 2017-08-16 DIAGNOSIS — E119 Type 2 diabetes mellitus without complications: Secondary | ICD-10-CM | POA: Diagnosis not present

## 2017-08-21 DIAGNOSIS — R69 Illness, unspecified: Secondary | ICD-10-CM | POA: Diagnosis not present

## 2017-09-27 DIAGNOSIS — Z941 Heart transplant status: Secondary | ICD-10-CM | POA: Diagnosis not present

## 2017-09-27 DIAGNOSIS — Z942 Lung transplant status: Secondary | ICD-10-CM | POA: Diagnosis not present

## 2017-09-27 DIAGNOSIS — N432 Other hydrocele: Secondary | ICD-10-CM | POA: Diagnosis not present

## 2017-09-27 DIAGNOSIS — N433 Hydrocele, unspecified: Secondary | ICD-10-CM | POA: Diagnosis not present

## 2017-09-27 DIAGNOSIS — N509 Disorder of male genital organs, unspecified: Secondary | ICD-10-CM | POA: Diagnosis not present

## 2017-09-27 DIAGNOSIS — Z79899 Other long term (current) drug therapy: Secondary | ICD-10-CM | POA: Diagnosis not present

## 2017-09-27 DIAGNOSIS — I252 Old myocardial infarction: Secondary | ICD-10-CM | POA: Diagnosis not present

## 2017-09-27 DIAGNOSIS — N5089 Other specified disorders of the male genital organs: Secondary | ICD-10-CM | POA: Diagnosis not present

## 2017-09-27 DIAGNOSIS — N50812 Left testicular pain: Secondary | ICD-10-CM | POA: Diagnosis not present

## 2017-10-31 DIAGNOSIS — R69 Illness, unspecified: Secondary | ICD-10-CM | POA: Diagnosis not present

## 2017-11-13 DIAGNOSIS — E119 Type 2 diabetes mellitus without complications: Secondary | ICD-10-CM | POA: Diagnosis not present

## 2017-12-11 DIAGNOSIS — N433 Hydrocele, unspecified: Secondary | ICD-10-CM | POA: Diagnosis not present

## 2017-12-11 DIAGNOSIS — C61 Malignant neoplasm of prostate: Secondary | ICD-10-CM | POA: Diagnosis not present

## 2017-12-12 DIAGNOSIS — D899 Disorder involving the immune mechanism, unspecified: Secondary | ICD-10-CM | POA: Diagnosis not present

## 2017-12-12 DIAGNOSIS — Z4821 Encounter for aftercare following heart transplant: Secondary | ICD-10-CM | POA: Diagnosis not present

## 2017-12-12 DIAGNOSIS — I119 Hypertensive heart disease without heart failure: Secondary | ICD-10-CM | POA: Diagnosis not present

## 2017-12-12 DIAGNOSIS — Z794 Long term (current) use of insulin: Secondary | ICD-10-CM | POA: Diagnosis not present

## 2017-12-12 DIAGNOSIS — Z79899 Other long term (current) drug therapy: Secondary | ICD-10-CM | POA: Diagnosis not present

## 2017-12-12 DIAGNOSIS — Z48298 Encounter for aftercare following other organ transplant: Secondary | ICD-10-CM | POA: Diagnosis not present

## 2017-12-12 DIAGNOSIS — Z941 Heart transplant status: Secondary | ICD-10-CM | POA: Diagnosis not present

## 2017-12-12 DIAGNOSIS — E119 Type 2 diabetes mellitus without complications: Secondary | ICD-10-CM | POA: Diagnosis not present

## 2017-12-12 DIAGNOSIS — Z5181 Encounter for therapeutic drug level monitoring: Secondary | ICD-10-CM | POA: Diagnosis not present

## 2017-12-12 DIAGNOSIS — I151 Hypertension secondary to other renal disorders: Secondary | ICD-10-CM | POA: Diagnosis not present

## 2017-12-12 DIAGNOSIS — N2889 Other specified disorders of kidney and ureter: Secondary | ICD-10-CM | POA: Diagnosis not present

## 2017-12-13 DIAGNOSIS — R69 Illness, unspecified: Secondary | ICD-10-CM | POA: Diagnosis not present

## 2018-01-08 DIAGNOSIS — Z941 Heart transplant status: Secondary | ICD-10-CM | POA: Diagnosis not present

## 2018-01-12 DIAGNOSIS — Z23 Encounter for immunization: Secondary | ICD-10-CM | POA: Diagnosis not present

## 2018-02-08 DIAGNOSIS — E119 Type 2 diabetes mellitus without complications: Secondary | ICD-10-CM | POA: Diagnosis not present

## 2018-02-08 DIAGNOSIS — R69 Illness, unspecified: Secondary | ICD-10-CM | POA: Diagnosis not present

## 2018-02-08 DIAGNOSIS — Z941 Heart transplant status: Secondary | ICD-10-CM | POA: Diagnosis not present

## 2018-02-09 ENCOUNTER — Other Ambulatory Visit: Payer: Self-pay | Admitting: Family Medicine

## 2018-02-09 DIAGNOSIS — Z941 Heart transplant status: Secondary | ICD-10-CM | POA: Diagnosis not present

## 2018-02-09 DIAGNOSIS — L820 Inflamed seborrheic keratosis: Secondary | ICD-10-CM | POA: Diagnosis not present

## 2018-02-09 DIAGNOSIS — E782 Mixed hyperlipidemia: Secondary | ICD-10-CM | POA: Diagnosis not present

## 2018-02-09 DIAGNOSIS — C61 Malignant neoplasm of prostate: Secondary | ICD-10-CM | POA: Diagnosis not present

## 2018-02-09 DIAGNOSIS — Z794 Long term (current) use of insulin: Secondary | ICD-10-CM | POA: Diagnosis not present

## 2018-02-09 DIAGNOSIS — I10 Essential (primary) hypertension: Secondary | ICD-10-CM | POA: Diagnosis not present

## 2018-02-09 DIAGNOSIS — E1169 Type 2 diabetes mellitus with other specified complication: Secondary | ICD-10-CM | POA: Diagnosis not present

## 2018-02-09 DIAGNOSIS — Q828 Other specified congenital malformations of skin: Secondary | ICD-10-CM | POA: Diagnosis not present

## 2018-02-13 DIAGNOSIS — N433 Hydrocele, unspecified: Secondary | ICD-10-CM | POA: Diagnosis not present

## 2018-02-13 DIAGNOSIS — Z8546 Personal history of malignant neoplasm of prostate: Secondary | ICD-10-CM | POA: Diagnosis not present

## 2018-02-20 DIAGNOSIS — R9721 Rising PSA following treatment for malignant neoplasm of prostate: Secondary | ICD-10-CM | POA: Diagnosis not present

## 2018-02-20 DIAGNOSIS — N433 Hydrocele, unspecified: Secondary | ICD-10-CM | POA: Diagnosis not present

## 2018-02-24 DIAGNOSIS — H6121 Impacted cerumen, right ear: Secondary | ICD-10-CM | POA: Diagnosis not present

## 2018-03-06 DIAGNOSIS — R69 Illness, unspecified: Secondary | ICD-10-CM | POA: Diagnosis not present

## 2018-03-18 DIAGNOSIS — H6122 Impacted cerumen, left ear: Secondary | ICD-10-CM | POA: Diagnosis not present

## 2018-03-19 DIAGNOSIS — L570 Actinic keratosis: Secondary | ICD-10-CM | POA: Diagnosis not present

## 2018-03-19 DIAGNOSIS — L918 Other hypertrophic disorders of the skin: Secondary | ICD-10-CM | POA: Diagnosis not present

## 2018-03-19 DIAGNOSIS — L98499 Non-pressure chronic ulcer of skin of other sites with unspecified severity: Secondary | ICD-10-CM | POA: Diagnosis not present

## 2018-03-19 DIAGNOSIS — L821 Other seborrheic keratosis: Secondary | ICD-10-CM | POA: Diagnosis not present

## 2018-03-23 DIAGNOSIS — J069 Acute upper respiratory infection, unspecified: Secondary | ICD-10-CM | POA: Diagnosis not present

## 2018-03-23 DIAGNOSIS — D849 Immunodeficiency, unspecified: Secondary | ICD-10-CM | POA: Diagnosis not present

## 2018-03-23 DIAGNOSIS — Z941 Heart transplant status: Secondary | ICD-10-CM | POA: Diagnosis not present

## 2018-04-10 DIAGNOSIS — M79675 Pain in left toe(s): Secondary | ICD-10-CM | POA: Diagnosis not present

## 2018-05-01 DIAGNOSIS — M79675 Pain in left toe(s): Secondary | ICD-10-CM | POA: Diagnosis not present

## 2018-05-08 DIAGNOSIS — E119 Type 2 diabetes mellitus without complications: Secondary | ICD-10-CM | POA: Diagnosis not present

## 2018-05-23 DIAGNOSIS — R69 Illness, unspecified: Secondary | ICD-10-CM | POA: Diagnosis not present

## 2018-05-28 DIAGNOSIS — R69 Illness, unspecified: Secondary | ICD-10-CM | POA: Diagnosis not present

## 2018-06-11 DIAGNOSIS — E119 Type 2 diabetes mellitus without complications: Secondary | ICD-10-CM | POA: Diagnosis not present

## 2018-06-11 DIAGNOSIS — Z7982 Long term (current) use of aspirin: Secondary | ICD-10-CM | POA: Diagnosis not present

## 2018-06-11 DIAGNOSIS — I151 Hypertension secondary to other renal disorders: Secondary | ICD-10-CM | POA: Diagnosis not present

## 2018-06-11 DIAGNOSIS — I119 Hypertensive heart disease without heart failure: Secondary | ICD-10-CM | POA: Diagnosis not present

## 2018-06-11 DIAGNOSIS — Z4821 Encounter for aftercare following heart transplant: Secondary | ICD-10-CM | POA: Diagnosis not present

## 2018-06-11 DIAGNOSIS — Z941 Heart transplant status: Secondary | ICD-10-CM | POA: Diagnosis not present

## 2018-06-11 DIAGNOSIS — Z794 Long term (current) use of insulin: Secondary | ICD-10-CM | POA: Diagnosis not present

## 2018-06-11 DIAGNOSIS — Z79899 Other long term (current) drug therapy: Secondary | ICD-10-CM | POA: Diagnosis not present

## 2018-06-11 DIAGNOSIS — N2889 Other specified disorders of kidney and ureter: Secondary | ICD-10-CM | POA: Diagnosis not present

## 2018-07-19 DIAGNOSIS — E1169 Type 2 diabetes mellitus with other specified complication: Secondary | ICD-10-CM | POA: Diagnosis not present

## 2018-07-19 DIAGNOSIS — I10 Essential (primary) hypertension: Secondary | ICD-10-CM | POA: Diagnosis not present

## 2018-07-19 DIAGNOSIS — M109 Gout, unspecified: Secondary | ICD-10-CM | POA: Diagnosis not present

## 2018-07-19 DIAGNOSIS — K219 Gastro-esophageal reflux disease without esophagitis: Secondary | ICD-10-CM | POA: Diagnosis not present

## 2018-07-19 DIAGNOSIS — E782 Mixed hyperlipidemia: Secondary | ICD-10-CM | POA: Diagnosis not present

## 2018-07-19 DIAGNOSIS — Z0000 Encounter for general adult medical examination without abnormal findings: Secondary | ICD-10-CM | POA: Diagnosis not present

## 2018-07-19 DIAGNOSIS — R69 Illness, unspecified: Secondary | ICD-10-CM | POA: Diagnosis not present

## 2018-07-19 DIAGNOSIS — D849 Immunodeficiency, unspecified: Secondary | ICD-10-CM | POA: Diagnosis not present

## 2018-07-19 DIAGNOSIS — Z941 Heart transplant status: Secondary | ICD-10-CM | POA: Diagnosis not present

## 2018-07-19 DIAGNOSIS — C61 Malignant neoplasm of prostate: Secondary | ICD-10-CM | POA: Diagnosis not present

## 2018-07-24 DIAGNOSIS — H524 Presbyopia: Secondary | ICD-10-CM | POA: Diagnosis not present

## 2018-07-24 DIAGNOSIS — H40013 Open angle with borderline findings, low risk, bilateral: Secondary | ICD-10-CM | POA: Diagnosis not present

## 2018-07-24 DIAGNOSIS — H25013 Cortical age-related cataract, bilateral: Secondary | ICD-10-CM | POA: Diagnosis not present

## 2018-07-24 DIAGNOSIS — E113293 Type 2 diabetes mellitus with mild nonproliferative diabetic retinopathy without macular edema, bilateral: Secondary | ICD-10-CM | POA: Diagnosis not present

## 2018-07-24 DIAGNOSIS — H2513 Age-related nuclear cataract, bilateral: Secondary | ICD-10-CM | POA: Diagnosis not present

## 2018-07-25 DIAGNOSIS — Z0100 Encounter for examination of eyes and vision without abnormal findings: Secondary | ICD-10-CM | POA: Diagnosis not present

## 2018-07-30 DIAGNOSIS — Z8546 Personal history of malignant neoplasm of prostate: Secondary | ICD-10-CM | POA: Diagnosis not present

## 2018-08-03 DIAGNOSIS — E119 Type 2 diabetes mellitus without complications: Secondary | ICD-10-CM | POA: Diagnosis not present

## 2018-08-09 DIAGNOSIS — H524 Presbyopia: Secondary | ICD-10-CM | POA: Diagnosis not present

## 2018-08-13 DIAGNOSIS — N5201 Erectile dysfunction due to arterial insufficiency: Secondary | ICD-10-CM | POA: Diagnosis not present

## 2018-08-13 DIAGNOSIS — R9721 Rising PSA following treatment for malignant neoplasm of prostate: Secondary | ICD-10-CM | POA: Diagnosis not present

## 2018-08-19 DIAGNOSIS — R69 Illness, unspecified: Secondary | ICD-10-CM | POA: Diagnosis not present

## 2018-10-10 DIAGNOSIS — Z20828 Contact with and (suspected) exposure to other viral communicable diseases: Secondary | ICD-10-CM | POA: Diagnosis not present

## 2018-10-31 DIAGNOSIS — E119 Type 2 diabetes mellitus without complications: Secondary | ICD-10-CM | POA: Diagnosis not present

## 2018-11-06 DIAGNOSIS — R69 Illness, unspecified: Secondary | ICD-10-CM | POA: Diagnosis not present

## 2018-12-17 DIAGNOSIS — D849 Immunodeficiency, unspecified: Secondary | ICD-10-CM | POA: Diagnosis not present

## 2018-12-17 DIAGNOSIS — E1169 Type 2 diabetes mellitus with other specified complication: Secondary | ICD-10-CM | POA: Diagnosis not present

## 2018-12-17 DIAGNOSIS — M179 Osteoarthritis of knee, unspecified: Secondary | ICD-10-CM | POA: Diagnosis not present

## 2018-12-17 DIAGNOSIS — Z941 Heart transplant status: Secondary | ICD-10-CM | POA: Diagnosis not present

## 2018-12-17 DIAGNOSIS — I10 Essential (primary) hypertension: Secondary | ICD-10-CM | POA: Diagnosis not present

## 2018-12-17 DIAGNOSIS — Z7189 Other specified counseling: Secondary | ICD-10-CM | POA: Diagnosis not present

## 2018-12-17 DIAGNOSIS — E782 Mixed hyperlipidemia: Secondary | ICD-10-CM | POA: Diagnosis not present

## 2018-12-27 DIAGNOSIS — R69 Illness, unspecified: Secondary | ICD-10-CM | POA: Diagnosis not present

## 2019-01-28 DIAGNOSIS — E119 Type 2 diabetes mellitus without complications: Secondary | ICD-10-CM | POA: Diagnosis not present

## 2019-02-01 DIAGNOSIS — M79605 Pain in left leg: Secondary | ICD-10-CM | POA: Diagnosis not present

## 2019-02-01 DIAGNOSIS — E782 Mixed hyperlipidemia: Secondary | ICD-10-CM | POA: Diagnosis not present

## 2019-02-01 DIAGNOSIS — Z23 Encounter for immunization: Secondary | ICD-10-CM | POA: Diagnosis not present

## 2019-02-01 DIAGNOSIS — E1169 Type 2 diabetes mellitus with other specified complication: Secondary | ICD-10-CM | POA: Diagnosis not present

## 2019-02-01 DIAGNOSIS — M25521 Pain in right elbow: Secondary | ICD-10-CM | POA: Diagnosis not present

## 2019-02-01 DIAGNOSIS — I10 Essential (primary) hypertension: Secondary | ICD-10-CM | POA: Diagnosis not present

## 2019-02-06 DIAGNOSIS — R69 Illness, unspecified: Secondary | ICD-10-CM | POA: Diagnosis not present

## 2019-02-09 DIAGNOSIS — H6503 Acute serous otitis media, bilateral: Secondary | ICD-10-CM | POA: Diagnosis not present

## 2019-02-09 DIAGNOSIS — J302 Other seasonal allergic rhinitis: Secondary | ICD-10-CM | POA: Diagnosis not present

## 2019-02-21 DIAGNOSIS — E119 Type 2 diabetes mellitus without complications: Secondary | ICD-10-CM | POA: Diagnosis not present

## 2019-03-01 DIAGNOSIS — R69 Illness, unspecified: Secondary | ICD-10-CM | POA: Diagnosis not present

## 2019-03-11 DIAGNOSIS — R945 Abnormal results of liver function studies: Secondary | ICD-10-CM | POA: Diagnosis not present

## 2019-04-01 DIAGNOSIS — L570 Actinic keratosis: Secondary | ICD-10-CM | POA: Diagnosis not present

## 2019-04-01 DIAGNOSIS — L853 Xerosis cutis: Secondary | ICD-10-CM | POA: Diagnosis not present

## 2019-04-01 DIAGNOSIS — L820 Inflamed seborrheic keratosis: Secondary | ICD-10-CM | POA: Diagnosis not present

## 2019-04-02 DIAGNOSIS — Z8546 Personal history of malignant neoplasm of prostate: Secondary | ICD-10-CM | POA: Diagnosis not present

## 2019-04-09 DIAGNOSIS — C61 Malignant neoplasm of prostate: Secondary | ICD-10-CM | POA: Diagnosis not present

## 2019-04-09 DIAGNOSIS — R9721 Rising PSA following treatment for malignant neoplasm of prostate: Secondary | ICD-10-CM | POA: Diagnosis not present

## 2019-04-09 DIAGNOSIS — N5201 Erectile dysfunction due to arterial insufficiency: Secondary | ICD-10-CM | POA: Diagnosis not present

## 2019-04-09 DIAGNOSIS — N393 Stress incontinence (female) (male): Secondary | ICD-10-CM | POA: Diagnosis not present

## 2019-04-15 DIAGNOSIS — R945 Abnormal results of liver function studies: Secondary | ICD-10-CM | POA: Diagnosis not present

## 2019-04-18 DIAGNOSIS — S81812A Laceration without foreign body, left lower leg, initial encounter: Secondary | ICD-10-CM | POA: Diagnosis not present

## 2019-04-18 DIAGNOSIS — S8012XA Contusion of left lower leg, initial encounter: Secondary | ICD-10-CM | POA: Diagnosis not present

## 2019-04-18 DIAGNOSIS — L03116 Cellulitis of left lower limb: Secondary | ICD-10-CM | POA: Diagnosis not present

## 2019-04-21 DIAGNOSIS — L03116 Cellulitis of left lower limb: Secondary | ICD-10-CM | POA: Diagnosis not present

## 2019-04-21 DIAGNOSIS — L089 Local infection of the skin and subcutaneous tissue, unspecified: Secondary | ICD-10-CM | POA: Diagnosis not present

## 2019-04-21 DIAGNOSIS — S80812D Abrasion, left lower leg, subsequent encounter: Secondary | ICD-10-CM | POA: Diagnosis not present

## 2019-04-23 ENCOUNTER — Ambulatory Visit: Payer: Medicare Other | Attending: Internal Medicine

## 2019-04-23 ENCOUNTER — Ambulatory Visit: Payer: Medicare Other

## 2019-04-23 DIAGNOSIS — Z23 Encounter for immunization: Secondary | ICD-10-CM | POA: Insufficient documentation

## 2019-04-23 NOTE — Progress Notes (Signed)
   Covid-19 Vaccination Clinic  Name:  John [name]    MRN: [id number] DOB: 05-Oct-1950  04/23/2019  [name] was observed post Covid-19 immunization for 15 minutes without incidence. He was provided with Vaccine Information Sheet and instruction to access the V-Safe system.   [name] was instructed to call 911 with any severe reactions post vaccine: Marland Kitchen Difficulty breathing  . Swelling of your face and throat  . A fast heartbeat  . A bad rash all over your body  . Dizziness and weakness    Immunizations Administered    Name Date Dose VIS Date Route   Pfizer COVID-19 Vaccine 04/23/2019  9:10 AM 0.3 mL 03/15/2019 Intramuscular   Manufacturer: [employer], [employer]   Lot: [id number]   [place]: [id number]

## 2019-04-24 DIAGNOSIS — M79606 Pain in leg, unspecified: Secondary | ICD-10-CM | POA: Diagnosis not present

## 2019-04-24 DIAGNOSIS — R748 Abnormal levels of other serum enzymes: Secondary | ICD-10-CM | POA: Diagnosis not present

## 2019-04-25 ENCOUNTER — Other Ambulatory Visit: Payer: Self-pay | Admitting: Family Medicine

## 2019-04-25 ENCOUNTER — Ambulatory Visit: Payer: Medicare HMO

## 2019-04-25 ENCOUNTER — Other Ambulatory Visit: Payer: Medicare HMO

## 2019-04-25 ENCOUNTER — Ambulatory Visit
Admission: RE | Admit: 2019-04-25 | Discharge: 2019-04-25 | Disposition: A | Discharge status: Home / Self Care | Payer: Medicare Other | Source: Ambulatory Visit | Attending: Family Medicine | Admitting: Family Medicine

## 2019-04-25 DIAGNOSIS — S81802A Unspecified open wound, left lower leg, initial encounter: Secondary | ICD-10-CM

## 2019-04-25 DIAGNOSIS — E119 Type 2 diabetes mellitus without complications: Secondary | ICD-10-CM | POA: Diagnosis not present

## 2019-05-13 ENCOUNTER — Ambulatory Visit: Payer: Medicare HMO | Attending: Internal Medicine

## 2019-05-13 DIAGNOSIS — Z23 Encounter for immunization: Secondary | ICD-10-CM | POA: Insufficient documentation

## 2019-05-13 DIAGNOSIS — E1169 Type 2 diabetes mellitus with other specified complication: Secondary | ICD-10-CM | POA: Diagnosis not present

## 2019-05-13 NOTE — Progress Notes (Signed)
   Covid-19 Vaccination Clinic  Name:  John [name]    MRN: [id number] DOB: April 05, 1950  05/13/2019  [name] was observed post Covid-19 immunization for 15 minutes without incidence. He was provided with Vaccine Information Sheet and instruction to access the V-Safe system.   [name] was instructed to call 911 with any severe reactions post vaccine: Marland Kitchen Difficulty breathing  . Swelling of your face and throat  . A fast heartbeat  . A bad rash all over your body  . Dizziness and weakness    Immunizations Administered    Name Date Dose VIS Date Route   Pfizer COVID-19 Vaccine 05/13/2019  9:11 AM 0.3 mL 03/15/2019 Intramuscular   Manufacturer: [place]   Lot: [id number]   [place]: [id number]

## 2019-05-23 DIAGNOSIS — R69 Illness, unspecified: Secondary | ICD-10-CM | POA: Diagnosis not present

## 2019-06-02 DIAGNOSIS — R69 Illness, unspecified: Secondary | ICD-10-CM | POA: Diagnosis not present

## 2019-06-10 DIAGNOSIS — I10 Essential (primary) hypertension: Secondary | ICD-10-CM | POA: Diagnosis not present

## 2019-06-10 DIAGNOSIS — Z125 Encounter for screening for malignant neoplasm of prostate: Secondary | ICD-10-CM | POA: Diagnosis not present

## 2019-06-10 DIAGNOSIS — Z941 Heart transplant status: Secondary | ICD-10-CM | POA: Diagnosis not present

## 2019-06-10 DIAGNOSIS — Z79899 Other long term (current) drug therapy: Secondary | ICD-10-CM | POA: Diagnosis not present

## 2019-06-10 DIAGNOSIS — Z4821 Encounter for aftercare following heart transplant: Secondary | ICD-10-CM | POA: Diagnosis not present

## 2019-06-10 DIAGNOSIS — D849 Immunodeficiency, unspecified: Secondary | ICD-10-CM | POA: Diagnosis not present

## 2019-06-17 DIAGNOSIS — H04123 Dry eye syndrome of bilateral lacrimal glands: Secondary | ICD-10-CM | POA: Diagnosis not present

## 2019-06-17 DIAGNOSIS — E119 Type 2 diabetes mellitus without complications: Secondary | ICD-10-CM | POA: Diagnosis not present

## 2019-06-17 DIAGNOSIS — H40013 Open angle with borderline findings, low risk, bilateral: Secondary | ICD-10-CM | POA: Diagnosis not present

## 2019-06-17 DIAGNOSIS — H04213 Epiphora due to excess lacrimation, bilateral lacrimal glands: Secondary | ICD-10-CM | POA: Diagnosis not present

## 2019-06-20 DIAGNOSIS — R69 Illness, unspecified: Secondary | ICD-10-CM | POA: Diagnosis not present

## 2019-06-21 DIAGNOSIS — R69 Illness, unspecified: Secondary | ICD-10-CM | POA: Diagnosis not present

## 2019-07-22 ENCOUNTER — Other Ambulatory Visit: Payer: Self-pay | Admitting: Family Medicine

## 2019-07-22 DIAGNOSIS — E1169 Type 2 diabetes mellitus with other specified complication: Secondary | ICD-10-CM | POA: Diagnosis not present

## 2019-07-22 DIAGNOSIS — N182 Chronic kidney disease, stage 2 (mild): Secondary | ICD-10-CM | POA: Diagnosis not present

## 2019-07-22 DIAGNOSIS — C61 Malignant neoplasm of prostate: Secondary | ICD-10-CM | POA: Diagnosis not present

## 2019-07-22 DIAGNOSIS — I10 Essential (primary) hypertension: Secondary | ICD-10-CM | POA: Diagnosis not present

## 2019-07-22 DIAGNOSIS — E782 Mixed hyperlipidemia: Secondary | ICD-10-CM | POA: Diagnosis not present

## 2019-07-22 DIAGNOSIS — K219 Gastro-esophageal reflux disease without esophagitis: Secondary | ICD-10-CM | POA: Diagnosis not present

## 2019-07-22 DIAGNOSIS — L859 Epidermal thickening, unspecified: Secondary | ICD-10-CM | POA: Diagnosis not present

## 2019-07-22 DIAGNOSIS — Z0000 Encounter for general adult medical examination without abnormal findings: Secondary | ICD-10-CM | POA: Diagnosis not present

## 2019-07-22 DIAGNOSIS — R69 Illness, unspecified: Secondary | ICD-10-CM | POA: Diagnosis not present

## 2019-07-22 DIAGNOSIS — Z941 Heart transplant status: Secondary | ICD-10-CM | POA: Diagnosis not present

## 2019-07-23 DIAGNOSIS — E119 Type 2 diabetes mellitus without complications: Secondary | ICD-10-CM | POA: Diagnosis not present

## 2019-07-30 DIAGNOSIS — Z8546 Personal history of malignant neoplasm of prostate: Secondary | ICD-10-CM | POA: Diagnosis not present

## 2019-08-06 DIAGNOSIS — N393 Stress incontinence (female) (male): Secondary | ICD-10-CM | POA: Diagnosis not present

## 2019-08-06 DIAGNOSIS — N5201 Erectile dysfunction due to arterial insufficiency: Secondary | ICD-10-CM | POA: Diagnosis not present

## 2019-08-06 DIAGNOSIS — Z8546 Personal history of malignant neoplasm of prostate: Secondary | ICD-10-CM | POA: Diagnosis not present

## 2019-08-06 DIAGNOSIS — R9721 Rising PSA following treatment for malignant neoplasm of prostate: Secondary | ICD-10-CM | POA: Diagnosis not present

## 2019-08-07 DIAGNOSIS — R945 Abnormal results of liver function studies: Secondary | ICD-10-CM | POA: Diagnosis not present

## 2019-08-07 DIAGNOSIS — R69 Illness, unspecified: Secondary | ICD-10-CM | POA: Diagnosis not present

## 2019-08-14 DIAGNOSIS — R69 Illness, unspecified: Secondary | ICD-10-CM | POA: Diagnosis not present

## 2019-08-20 ENCOUNTER — Other Ambulatory Visit: Payer: Self-pay | Admitting: Family Medicine

## 2019-08-20 DIAGNOSIS — R945 Abnormal results of liver function studies: Secondary | ICD-10-CM

## 2019-08-20 DIAGNOSIS — R7989 Other specified abnormal findings of blood chemistry: Secondary | ICD-10-CM

## 2019-08-22 ENCOUNTER — Ambulatory Visit
Admission: RE | Admit: 2019-08-22 | Discharge: 2019-08-22 | Disposition: A | Discharge status: Home / Self Care | Payer: Medicare HMO | Source: Ambulatory Visit | Attending: Family Medicine | Admitting: Family Medicine

## 2019-08-22 DIAGNOSIS — R945 Abnormal results of liver function studies: Secondary | ICD-10-CM

## 2019-08-22 DIAGNOSIS — K7689 Other specified diseases of liver: Secondary | ICD-10-CM | POA: Diagnosis not present

## 2019-08-22 DIAGNOSIS — R7989 Other specified abnormal findings of blood chemistry: Secondary | ICD-10-CM

## 2019-10-11 DIAGNOSIS — R768 Other specified abnormal immunological findings in serum: Secondary | ICD-10-CM | POA: Diagnosis not present

## 2019-10-11 DIAGNOSIS — Z9225 Personal history of immunosupression therapy: Secondary | ICD-10-CM | POA: Diagnosis not present

## 2019-10-11 DIAGNOSIS — Z941 Heart transplant status: Secondary | ICD-10-CM | POA: Diagnosis not present

## 2019-10-11 DIAGNOSIS — K760 Fatty (change of) liver, not elsewhere classified: Secondary | ICD-10-CM | POA: Diagnosis not present

## 2019-10-11 DIAGNOSIS — Z20822 Contact with and (suspected) exposure to covid-19: Secondary | ICD-10-CM | POA: Diagnosis not present

## 2019-10-11 DIAGNOSIS — Z48298 Encounter for aftercare following other organ transplant: Secondary | ICD-10-CM | POA: Diagnosis not present

## 2019-10-18 DIAGNOSIS — E119 Type 2 diabetes mellitus without complications: Secondary | ICD-10-CM | POA: Diagnosis not present

## 2019-11-05 DIAGNOSIS — R69 Illness, unspecified: Secondary | ICD-10-CM | POA: Diagnosis not present

## 2019-12-19 DIAGNOSIS — R69 Illness, unspecified: Secondary | ICD-10-CM | POA: Diagnosis not present

## 2020-01-15 DIAGNOSIS — E119 Type 2 diabetes mellitus without complications: Secondary | ICD-10-CM | POA: Diagnosis not present

## 2020-01-22 DIAGNOSIS — Z23 Encounter for immunization: Secondary | ICD-10-CM | POA: Diagnosis not present

## 2020-01-27 DIAGNOSIS — R69 Illness, unspecified: Secondary | ICD-10-CM | POA: Diagnosis not present

## 2020-01-28 DIAGNOSIS — D2361 Other benign neoplasm of skin of right upper limb, including shoulder: Secondary | ICD-10-CM | POA: Diagnosis not present

## 2020-01-28 DIAGNOSIS — L918 Other hypertrophic disorders of the skin: Secondary | ICD-10-CM | POA: Diagnosis not present

## 2020-01-28 DIAGNOSIS — L821 Other seborrheic keratosis: Secondary | ICD-10-CM | POA: Diagnosis not present

## 2020-01-28 DIAGNOSIS — L820 Inflamed seborrheic keratosis: Secondary | ICD-10-CM | POA: Diagnosis not present

## 2020-01-28 DIAGNOSIS — L570 Actinic keratosis: Secondary | ICD-10-CM | POA: Diagnosis not present

## 2020-01-29 DIAGNOSIS — M1712 Unilateral primary osteoarthritis, left knee: Secondary | ICD-10-CM | POA: Diagnosis not present

## 2020-02-03 DIAGNOSIS — E1169 Type 2 diabetes mellitus with other specified complication: Secondary | ICD-10-CM | POA: Diagnosis not present

## 2020-02-03 DIAGNOSIS — D849 Immunodeficiency, unspecified: Secondary | ICD-10-CM | POA: Diagnosis not present

## 2020-02-03 DIAGNOSIS — E782 Mixed hyperlipidemia: Secondary | ICD-10-CM | POA: Diagnosis not present

## 2020-02-03 DIAGNOSIS — Z941 Heart transplant status: Secondary | ICD-10-CM | POA: Diagnosis not present

## 2020-02-03 DIAGNOSIS — Z8546 Personal history of malignant neoplasm of prostate: Secondary | ICD-10-CM | POA: Diagnosis not present

## 2020-02-03 DIAGNOSIS — R748 Abnormal levels of other serum enzymes: Secondary | ICD-10-CM | POA: Diagnosis not present

## 2020-02-03 DIAGNOSIS — I10 Essential (primary) hypertension: Secondary | ICD-10-CM | POA: Diagnosis not present

## 2020-02-10 DIAGNOSIS — N5231 Erectile dysfunction following radical prostatectomy: Secondary | ICD-10-CM | POA: Diagnosis not present

## 2020-02-10 DIAGNOSIS — N393 Stress incontinence (female) (male): Secondary | ICD-10-CM | POA: Diagnosis not present

## 2020-02-10 DIAGNOSIS — R9721 Rising PSA following treatment for malignant neoplasm of prostate: Secondary | ICD-10-CM | POA: Diagnosis not present

## 2020-02-10 DIAGNOSIS — C61 Malignant neoplasm of prostate: Secondary | ICD-10-CM | POA: Diagnosis not present

## 2020-02-17 DIAGNOSIS — R69 Illness, unspecified: Secondary | ICD-10-CM | POA: Diagnosis not present

## 2020-02-25 DIAGNOSIS — R69 Illness, unspecified: Secondary | ICD-10-CM | POA: Diagnosis not present

## 2020-03-02 DIAGNOSIS — M1712 Unilateral primary osteoarthritis, left knee: Secondary | ICD-10-CM | POA: Diagnosis not present

## 2020-04-13 DIAGNOSIS — M1712 Unilateral primary osteoarthritis, left knee: Secondary | ICD-10-CM | POA: Diagnosis not present

## 2020-05-14 DIAGNOSIS — E119 Type 2 diabetes mellitus without complications: Secondary | ICD-10-CM | POA: Diagnosis not present

## 2020-06-15 DIAGNOSIS — Z941 Heart transplant status: Secondary | ICD-10-CM | POA: Diagnosis not present

## 2020-06-15 DIAGNOSIS — I517 Cardiomegaly: Secondary | ICD-10-CM | POA: Diagnosis not present

## 2020-06-15 DIAGNOSIS — D849 Immunodeficiency, unspecified: Secondary | ICD-10-CM | POA: Diagnosis not present

## 2020-06-15 DIAGNOSIS — E785 Hyperlipidemia, unspecified: Secondary | ICD-10-CM | POA: Diagnosis not present

## 2020-06-15 DIAGNOSIS — Z23 Encounter for immunization: Secondary | ICD-10-CM | POA: Diagnosis not present

## 2020-06-15 DIAGNOSIS — Z48298 Encounter for aftercare following other organ transplant: Secondary | ICD-10-CM | POA: Diagnosis not present

## 2020-06-15 DIAGNOSIS — Z794 Long term (current) use of insulin: Secondary | ICD-10-CM | POA: Diagnosis not present

## 2020-06-15 DIAGNOSIS — I151 Hypertension secondary to other renal disorders: Secondary | ICD-10-CM | POA: Diagnosis not present

## 2020-06-15 DIAGNOSIS — N2889 Other specified disorders of kidney and ureter: Secondary | ICD-10-CM | POA: Diagnosis not present

## 2020-06-15 DIAGNOSIS — D84821 Immunodeficiency due to drugs: Secondary | ICD-10-CM | POA: Diagnosis not present

## 2020-06-15 DIAGNOSIS — Z4821 Encounter for aftercare following heart transplant: Secondary | ICD-10-CM | POA: Diagnosis not present

## 2020-06-15 DIAGNOSIS — E1122 Type 2 diabetes mellitus with diabetic chronic kidney disease: Secondary | ICD-10-CM | POA: Diagnosis not present

## 2020-06-15 DIAGNOSIS — I129 Hypertensive chronic kidney disease with stage 1 through stage 4 chronic kidney disease, or unspecified chronic kidney disease: Secondary | ICD-10-CM | POA: Diagnosis not present

## 2020-06-15 DIAGNOSIS — N189 Chronic kidney disease, unspecified: Secondary | ICD-10-CM | POA: Diagnosis not present

## 2020-06-15 DIAGNOSIS — Z8546 Personal history of malignant neoplasm of prostate: Secondary | ICD-10-CM | POA: Diagnosis not present

## 2020-06-22 DIAGNOSIS — H25013 Cortical age-related cataract, bilateral: Secondary | ICD-10-CM | POA: Diagnosis not present

## 2020-06-22 DIAGNOSIS — H40013 Open angle with borderline findings, low risk, bilateral: Secondary | ICD-10-CM | POA: Diagnosis not present

## 2020-06-22 DIAGNOSIS — H2513 Age-related nuclear cataract, bilateral: Secondary | ICD-10-CM | POA: Diagnosis not present

## 2020-06-22 DIAGNOSIS — E113293 Type 2 diabetes mellitus with mild nonproliferative diabetic retinopathy without macular edema, bilateral: Secondary | ICD-10-CM | POA: Diagnosis not present

## 2020-07-20 DIAGNOSIS — Z8546 Personal history of malignant neoplasm of prostate: Secondary | ICD-10-CM | POA: Diagnosis not present

## 2020-07-27 DIAGNOSIS — K219 Gastro-esophageal reflux disease without esophagitis: Secondary | ICD-10-CM | POA: Diagnosis not present

## 2020-07-27 DIAGNOSIS — Z8546 Personal history of malignant neoplasm of prostate: Secondary | ICD-10-CM | POA: Diagnosis not present

## 2020-07-27 DIAGNOSIS — C61 Malignant neoplasm of prostate: Secondary | ICD-10-CM | POA: Diagnosis not present

## 2020-07-27 DIAGNOSIS — Z0000 Encounter for general adult medical examination without abnormal findings: Secondary | ICD-10-CM | POA: Diagnosis not present

## 2020-07-27 DIAGNOSIS — N5231 Erectile dysfunction following radical prostatectomy: Secondary | ICD-10-CM | POA: Diagnosis not present

## 2020-07-27 DIAGNOSIS — R9721 Rising PSA following treatment for malignant neoplasm of prostate: Secondary | ICD-10-CM | POA: Diagnosis not present

## 2020-07-27 DIAGNOSIS — I10 Essential (primary) hypertension: Secondary | ICD-10-CM | POA: Diagnosis not present

## 2020-07-27 DIAGNOSIS — E782 Mixed hyperlipidemia: Secondary | ICD-10-CM | POA: Diagnosis not present

## 2020-07-27 DIAGNOSIS — R69 Illness, unspecified: Secondary | ICD-10-CM | POA: Diagnosis not present

## 2020-07-27 DIAGNOSIS — N393 Stress incontinence (female) (male): Secondary | ICD-10-CM | POA: Diagnosis not present

## 2020-07-27 DIAGNOSIS — R718 Other abnormality of red blood cells: Secondary | ICD-10-CM | POA: Diagnosis not present

## 2020-07-27 DIAGNOSIS — E1169 Type 2 diabetes mellitus with other specified complication: Secondary | ICD-10-CM | POA: Diagnosis not present

## 2020-07-27 DIAGNOSIS — Z941 Heart transplant status: Secondary | ICD-10-CM | POA: Diagnosis not present

## 2020-07-27 DIAGNOSIS — D692 Other nonthrombocytopenic purpura: Secondary | ICD-10-CM | POA: Diagnosis not present

## 2020-07-30 DIAGNOSIS — G4733 Obstructive sleep apnea (adult) (pediatric): Secondary | ICD-10-CM | POA: Diagnosis not present

## 2020-08-03 DIAGNOSIS — G4733 Obstructive sleep apnea (adult) (pediatric): Secondary | ICD-10-CM | POA: Diagnosis not present

## 2020-08-07 DIAGNOSIS — Z941 Heart transplant status: Secondary | ICD-10-CM | POA: Diagnosis not present

## 2020-08-07 DIAGNOSIS — Z9225 Personal history of immunosupression therapy: Secondary | ICD-10-CM | POA: Diagnosis not present

## 2020-08-07 DIAGNOSIS — Z48298 Encounter for aftercare following other organ transplant: Secondary | ICD-10-CM | POA: Diagnosis not present

## 2020-08-17 DIAGNOSIS — Z941 Heart transplant status: Secondary | ICD-10-CM | POA: Diagnosis not present

## 2020-08-27 DIAGNOSIS — E119 Type 2 diabetes mellitus without complications: Secondary | ICD-10-CM | POA: Diagnosis not present

## 2020-09-20 IMAGING — US US ABDOMEN LIMITED
1 series · 14 of 25 positions shown · non-contrast
Comparison: None.

CLINICAL DATA: Elevated liver enzymes

EXAM:
ULTRASOUND ABDOMEN LIMITED RIGHT UPPER QUADRANT

[Series 1: us abdomen limited · 0.17mm/px · 14 of 47 slices shown]
[im 1/47]
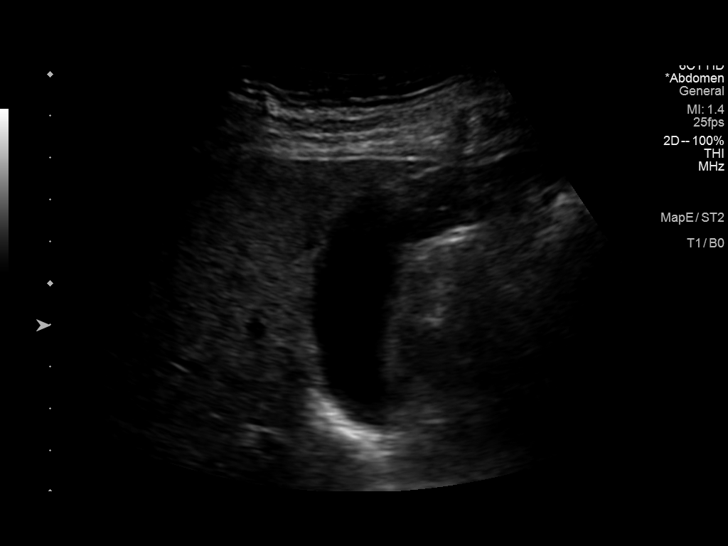
[im 4/47]
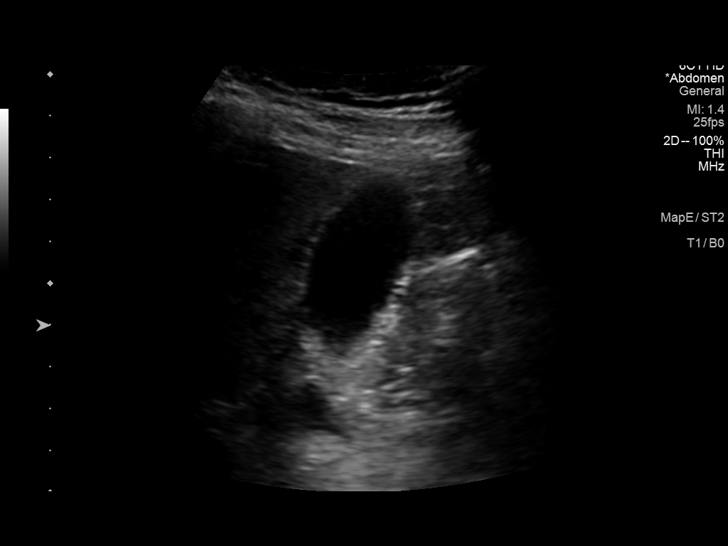
[im 8/47]
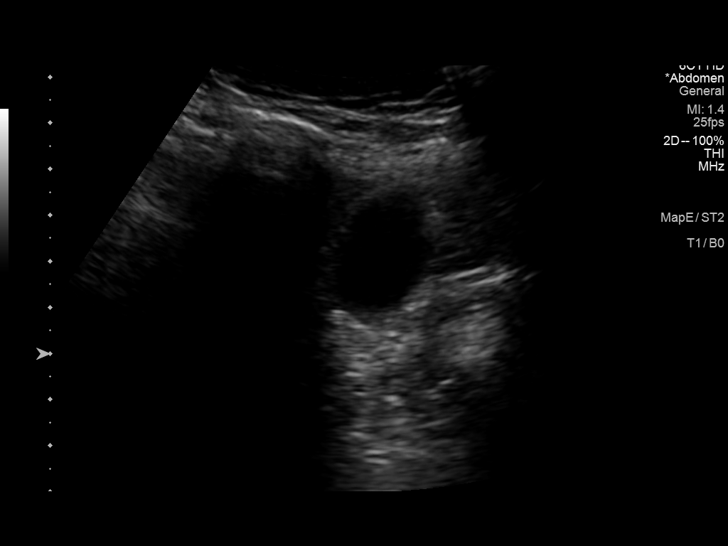
[im 12/47]
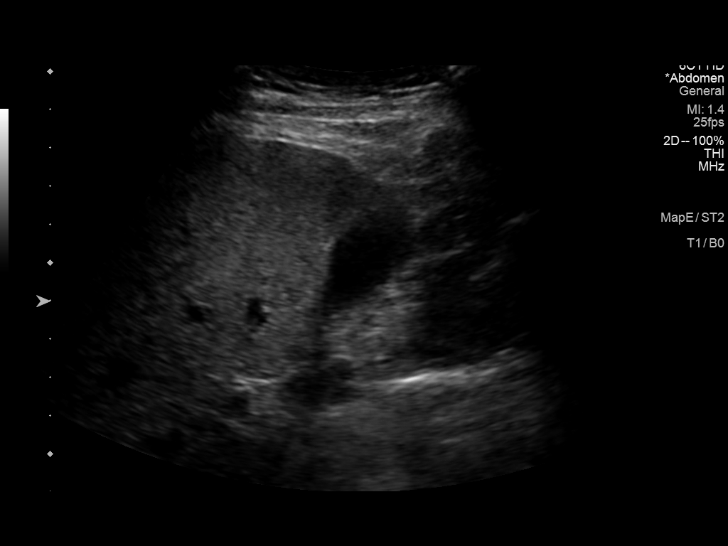
[im 16/47]
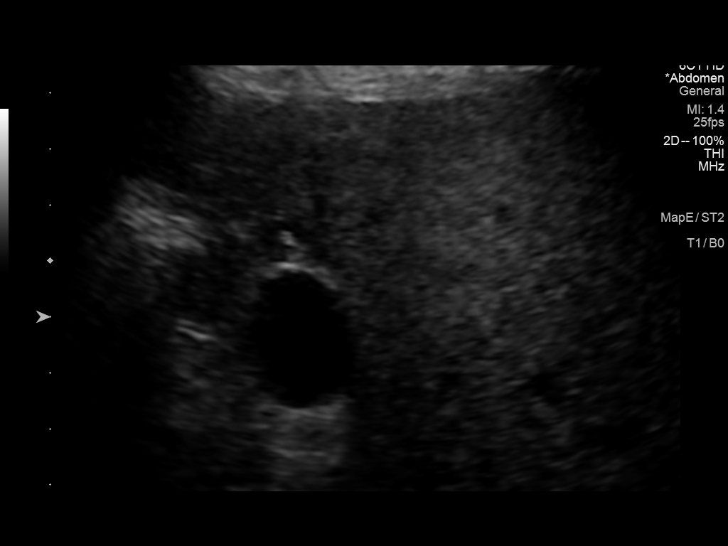
[im 18/47]
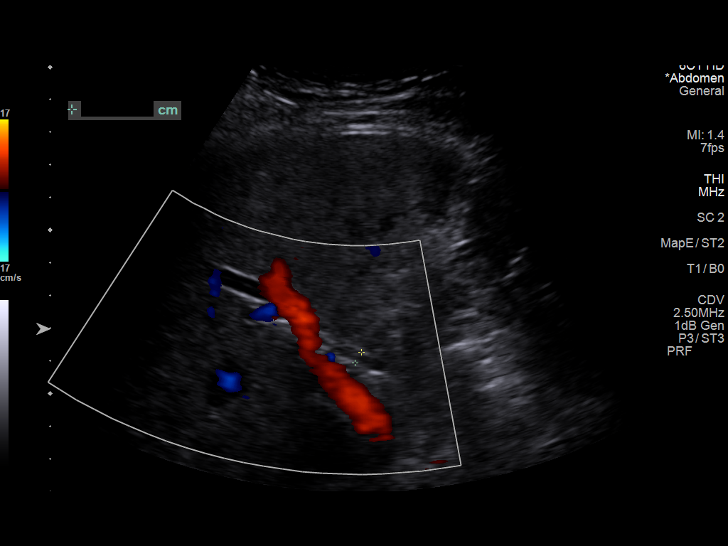
[im 22/47]
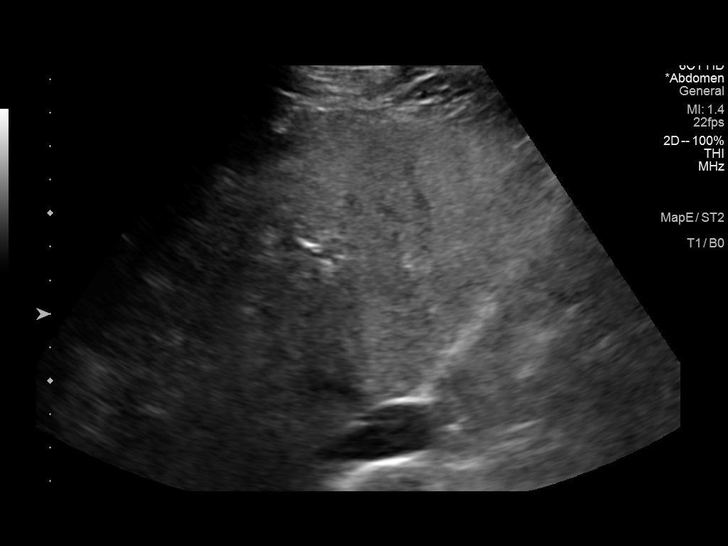
[im 25/47]
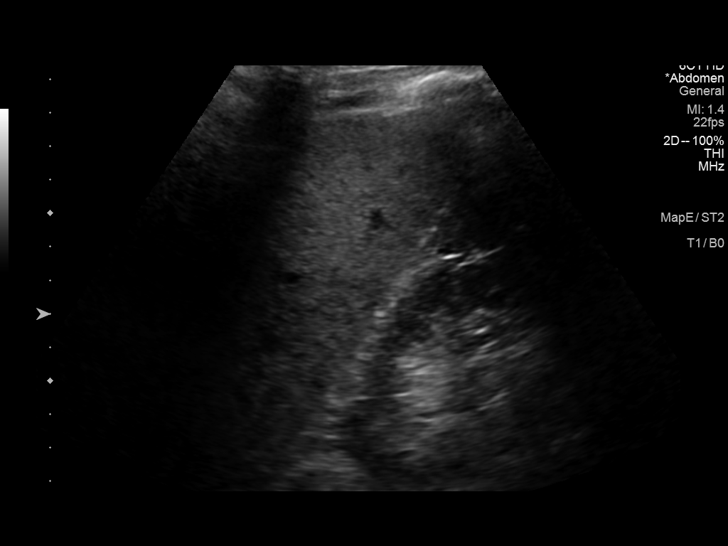
[im 29/47]
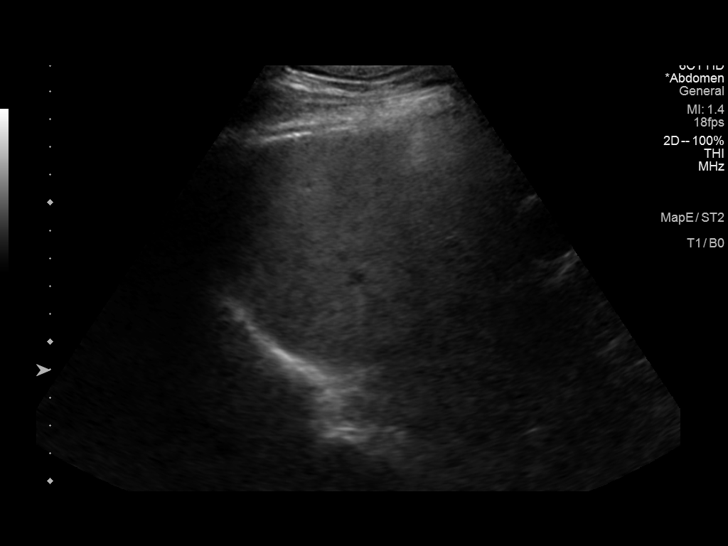
[im 31/47]
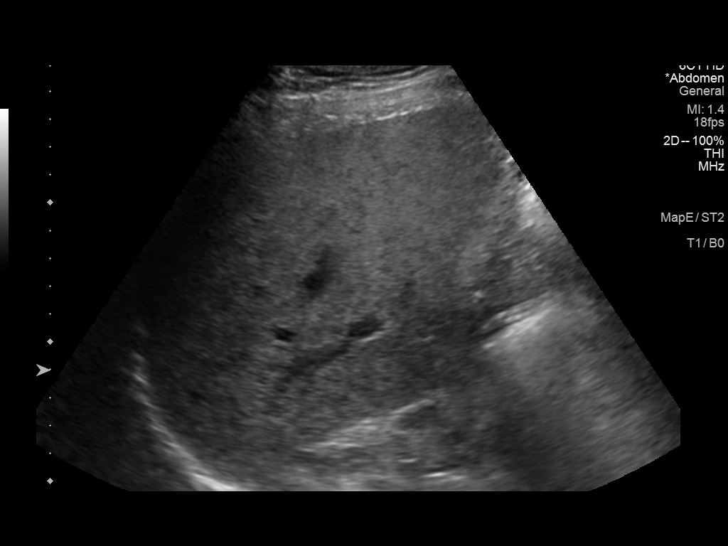
[im 35/47]
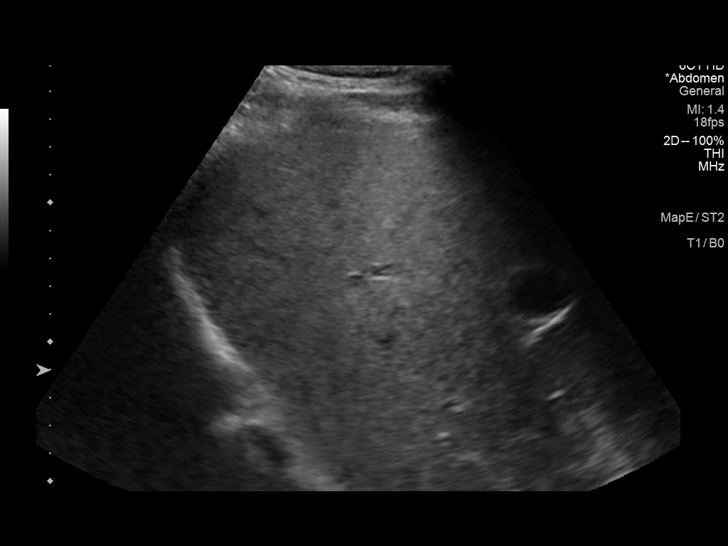
[im 39/47]
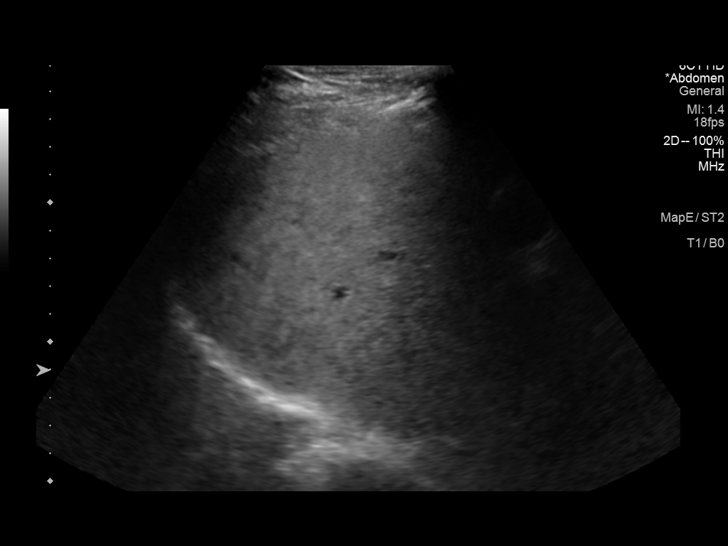
[im 43/47]
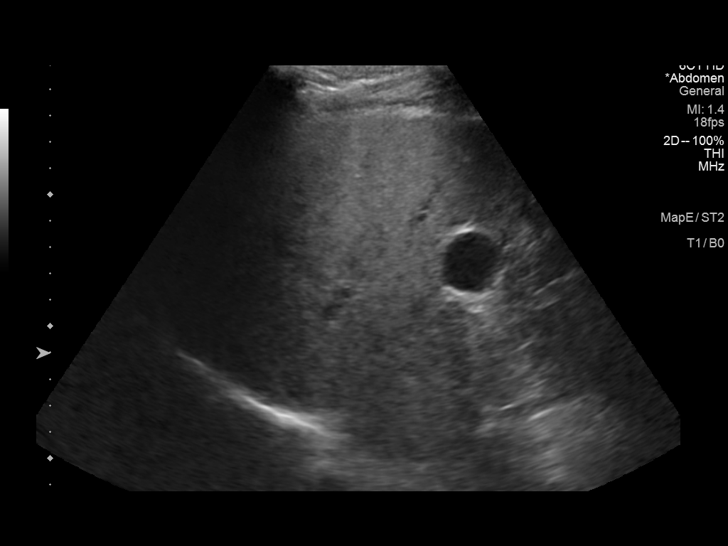
[im 47/47]
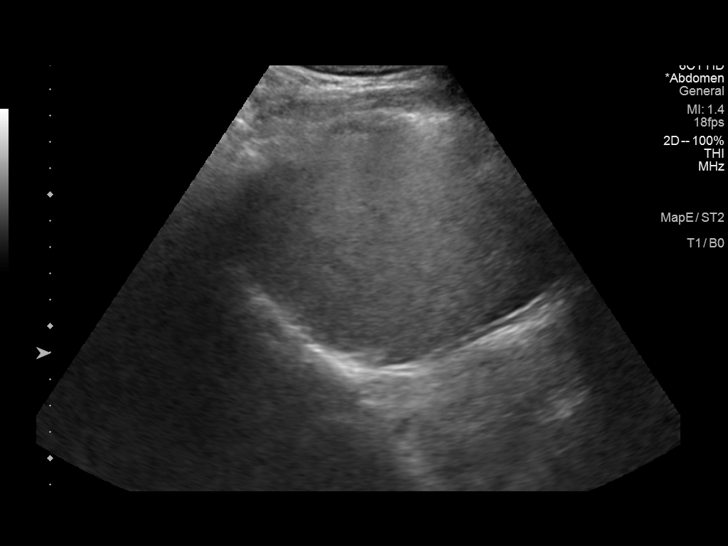

[14 of 25 positions shown; findings below may reference images not displayed]

FINDINGS: Gallbladder:

No gallstones or wall thickening visualized. There is no
pericholecystic fluid. No sonographic Murphy sign noted by
sonographer.

Common bile duct:

Diameter: 4 mm. No intrahepatic or extrahepatic biliary duct
dilatation.

Liver:

No focal lesion identified. Liver echogenicity overall is increased.
Portal vein is patent on color Doppler imaging with normal direction
of blood flow towards the liver.

Other: None.
IMPRESSION: Increased liver echogenicity, a finding indicative of hepatic
steatosis. No focal liver lesions are evident on this study.

Study otherwise unremarkable.

## 2020-10-22 DIAGNOSIS — G4733 Obstructive sleep apnea (adult) (pediatric): Secondary | ICD-10-CM | POA: Diagnosis not present

## 2020-11-04 DIAGNOSIS — G4733 Obstructive sleep apnea (adult) (pediatric): Secondary | ICD-10-CM | POA: Diagnosis not present

## 2020-11-19 DIAGNOSIS — E119 Type 2 diabetes mellitus without complications: Secondary | ICD-10-CM | POA: Diagnosis not present

## 2020-11-22 DIAGNOSIS — G4733 Obstructive sleep apnea (adult) (pediatric): Secondary | ICD-10-CM | POA: Diagnosis not present

## 2020-12-23 DIAGNOSIS — G4733 Obstructive sleep apnea (adult) (pediatric): Secondary | ICD-10-CM | POA: Diagnosis not present

## 2020-12-30 DIAGNOSIS — G4733 Obstructive sleep apnea (adult) (pediatric): Secondary | ICD-10-CM | POA: Diagnosis not present

## 2021-01-04 DIAGNOSIS — G4733 Obstructive sleep apnea (adult) (pediatric): Secondary | ICD-10-CM | POA: Diagnosis not present

## 2021-01-22 DIAGNOSIS — G4733 Obstructive sleep apnea (adult) (pediatric): Secondary | ICD-10-CM | POA: Diagnosis not present

## 2021-01-26 DIAGNOSIS — G4733 Obstructive sleep apnea (adult) (pediatric): Secondary | ICD-10-CM | POA: Diagnosis not present

## 2021-02-03 DIAGNOSIS — L814 Other melanin hyperpigmentation: Secondary | ICD-10-CM | POA: Diagnosis not present

## 2021-02-03 DIAGNOSIS — B078 Other viral warts: Secondary | ICD-10-CM | POA: Diagnosis not present

## 2021-02-03 DIAGNOSIS — L570 Actinic keratosis: Secondary | ICD-10-CM | POA: Diagnosis not present

## 2021-02-03 DIAGNOSIS — D1801 Hemangioma of skin and subcutaneous tissue: Secondary | ICD-10-CM | POA: Diagnosis not present

## 2021-02-03 DIAGNOSIS — L821 Other seborrheic keratosis: Secondary | ICD-10-CM | POA: Diagnosis not present

## 2021-02-03 DIAGNOSIS — D2361 Other benign neoplasm of skin of right upper limb, including shoulder: Secondary | ICD-10-CM | POA: Diagnosis not present

## 2021-02-03 DIAGNOSIS — L820 Inflamed seborrheic keratosis: Secondary | ICD-10-CM | POA: Diagnosis not present

## 2021-02-04 DIAGNOSIS — N182 Chronic kidney disease, stage 2 (mild): Secondary | ICD-10-CM | POA: Diagnosis not present

## 2021-02-04 DIAGNOSIS — R69 Illness, unspecified: Secondary | ICD-10-CM | POA: Diagnosis not present

## 2021-02-04 DIAGNOSIS — C61 Malignant neoplasm of prostate: Secondary | ICD-10-CM | POA: Diagnosis not present

## 2021-02-04 DIAGNOSIS — R319 Hematuria, unspecified: Secondary | ICD-10-CM | POA: Diagnosis not present

## 2021-02-04 DIAGNOSIS — Z941 Heart transplant status: Secondary | ICD-10-CM | POA: Diagnosis not present

## 2021-02-04 DIAGNOSIS — E1169 Type 2 diabetes mellitus with other specified complication: Secondary | ICD-10-CM | POA: Diagnosis not present

## 2021-02-04 DIAGNOSIS — E782 Mixed hyperlipidemia: Secondary | ICD-10-CM | POA: Diagnosis not present

## 2021-02-04 DIAGNOSIS — D849 Immunodeficiency, unspecified: Secondary | ICD-10-CM | POA: Diagnosis not present

## 2021-02-04 DIAGNOSIS — I10 Essential (primary) hypertension: Secondary | ICD-10-CM | POA: Diagnosis not present

## 2021-02-08 DIAGNOSIS — Z8546 Personal history of malignant neoplasm of prostate: Secondary | ICD-10-CM | POA: Diagnosis not present

## 2021-02-15 DIAGNOSIS — N393 Stress incontinence (female) (male): Secondary | ICD-10-CM | POA: Diagnosis not present

## 2021-02-15 DIAGNOSIS — Z8546 Personal history of malignant neoplasm of prostate: Secondary | ICD-10-CM | POA: Diagnosis not present

## 2021-02-15 DIAGNOSIS — R9721 Rising PSA following treatment for malignant neoplasm of prostate: Secondary | ICD-10-CM | POA: Diagnosis not present

## 2021-02-22 DIAGNOSIS — G4733 Obstructive sleep apnea (adult) (pediatric): Secondary | ICD-10-CM | POA: Diagnosis not present

## 2021-02-23 DIAGNOSIS — E119 Type 2 diabetes mellitus without complications: Secondary | ICD-10-CM | POA: Diagnosis not present

## 2021-03-01 ENCOUNTER — Other Ambulatory Visit (HOSPITAL_COMMUNITY): Payer: Self-pay | Admitting: Urology

## 2021-03-01 DIAGNOSIS — C61 Malignant neoplasm of prostate: Secondary | ICD-10-CM

## 2021-03-04 DIAGNOSIS — M62838 Other muscle spasm: Secondary | ICD-10-CM | POA: Diagnosis not present

## 2021-03-04 DIAGNOSIS — R3915 Urgency of urination: Secondary | ICD-10-CM | POA: Diagnosis not present

## 2021-03-04 DIAGNOSIS — M6281 Muscle weakness (generalized): Secondary | ICD-10-CM | POA: Diagnosis not present

## 2021-03-04 DIAGNOSIS — M6289 Other specified disorders of muscle: Secondary | ICD-10-CM | POA: Diagnosis not present

## 2021-03-08 DIAGNOSIS — G4733 Obstructive sleep apnea (adult) (pediatric): Secondary | ICD-10-CM | POA: Diagnosis not present

## 2021-03-09 ENCOUNTER — Other Ambulatory Visit: Payer: Self-pay

## 2021-03-09 ENCOUNTER — Ambulatory Visit (HOSPITAL_COMMUNITY)
Admission: RE | Admit: 2021-03-09 | Discharge: 2021-03-09 | Disposition: A | Discharge status: Home / Self Care | Payer: Medicare HMO | Source: Ambulatory Visit | Attending: Urology | Admitting: Urology

## 2021-03-09 DIAGNOSIS — Z8546 Personal history of malignant neoplasm of prostate: Secondary | ICD-10-CM | POA: Diagnosis not present

## 2021-03-09 DIAGNOSIS — C61 Malignant neoplasm of prostate: Secondary | ICD-10-CM | POA: Diagnosis not present

## 2021-03-09 MED ORDER — PIFLIFOLASTAT F 18 (PYLARIFY) INJECTION
9.0000 | Freq: Once | INTRAVENOUS | Status: AC
  Administered 2021-03-09: 9 via INTRAVENOUS

## 2021-03-17 ENCOUNTER — Other Ambulatory Visit: Payer: Self-pay | Admitting: Gastroenterology

## 2021-03-17 DIAGNOSIS — M1712 Unilateral primary osteoarthritis, left knee: Secondary | ICD-10-CM | POA: Diagnosis not present

## 2021-03-18 ENCOUNTER — Other Ambulatory Visit: Payer: Self-pay | Admitting: Gastroenterology

## 2021-03-19 DIAGNOSIS — M62838 Other muscle spasm: Secondary | ICD-10-CM | POA: Diagnosis not present

## 2021-03-19 DIAGNOSIS — M6281 Muscle weakness (generalized): Secondary | ICD-10-CM | POA: Diagnosis not present

## 2021-03-19 DIAGNOSIS — N393 Stress incontinence (female) (male): Secondary | ICD-10-CM | POA: Diagnosis not present

## 2021-03-24 ENCOUNTER — Ambulatory Visit (HOSPITAL_COMMUNITY): Payer: Medicare HMO | Admitting: Anesthesiology

## 2021-03-24 ENCOUNTER — Other Ambulatory Visit: Payer: Self-pay

## 2021-03-24 ENCOUNTER — Encounter (HOSPITAL_COMMUNITY): Payer: Self-pay | Admitting: Gastroenterology

## 2021-03-24 ENCOUNTER — Encounter (HOSPITAL_COMMUNITY)
Admission: RE | Disposition: A | Discharge status: Home / Self Care | Payer: Self-pay | Source: Home / Self Care | Attending: Gastroenterology

## 2021-03-24 ENCOUNTER — Ambulatory Visit (HOSPITAL_COMMUNITY)
Admission: RE | Admit: 2021-03-24 | Discharge: 2021-03-24 | Disposition: A | Discharge status: Home / Self Care | Payer: Medicare HMO | Attending: Gastroenterology | Admitting: Gastroenterology

## 2021-03-24 DIAGNOSIS — I252 Old myocardial infarction: Secondary | ICD-10-CM | POA: Diagnosis not present

## 2021-03-24 DIAGNOSIS — Z941 Heart transplant status: Secondary | ICD-10-CM | POA: Insufficient documentation

## 2021-03-24 DIAGNOSIS — Z87891 Personal history of nicotine dependence: Secondary | ICD-10-CM | POA: Insufficient documentation

## 2021-03-24 DIAGNOSIS — E119 Type 2 diabetes mellitus without complications: Secondary | ICD-10-CM | POA: Insufficient documentation

## 2021-03-24 DIAGNOSIS — K640 First degree hemorrhoids: Secondary | ICD-10-CM | POA: Diagnosis not present

## 2021-03-24 DIAGNOSIS — G4730 Sleep apnea, unspecified: Secondary | ICD-10-CM | POA: Insufficient documentation

## 2021-03-24 DIAGNOSIS — R9389 Abnormal findings on diagnostic imaging of other specified body structures: Secondary | ICD-10-CM | POA: Diagnosis not present

## 2021-03-24 DIAGNOSIS — R933 Abnormal findings on diagnostic imaging of other parts of digestive tract: Secondary | ICD-10-CM | POA: Diagnosis not present

## 2021-03-24 DIAGNOSIS — R935 Abnormal findings on diagnostic imaging of other abdominal regions, including retroperitoneum: Secondary | ICD-10-CM | POA: Diagnosis not present

## 2021-03-24 DIAGNOSIS — Z794 Long term (current) use of insulin: Secondary | ICD-10-CM | POA: Diagnosis not present

## 2021-03-24 DIAGNOSIS — K219 Gastro-esophageal reflux disease without esophagitis: Secondary | ICD-10-CM | POA: Insufficient documentation

## 2021-03-24 DIAGNOSIS — G4733 Obstructive sleep apnea (adult) (pediatric): Secondary | ICD-10-CM | POA: Diagnosis not present

## 2021-03-24 DIAGNOSIS — I10 Essential (primary) hypertension: Secondary | ICD-10-CM | POA: Insufficient documentation

## 2021-03-24 DIAGNOSIS — K6289 Other specified diseases of anus and rectum: Secondary | ICD-10-CM | POA: Diagnosis not present

## 2021-03-24 HISTORY — PX: COLONOSCOPY WITH PROPOFOL: SHX5780

## 2021-03-24 HISTORY — DX: Sleep apnea, unspecified: G47.30

## 2021-03-24 HISTORY — PX: BIOPSY: SHX5522

## 2021-03-24 LAB — GLUCOSE, CAPILLARY: Glucose-Capillary: 171 mg/dL — ABNORMAL HIGH (ref 70–99)

## 2021-03-24 SURGERY — COLONOSCOPY WITH PROPOFOL
Anesthesia: Monitor Anesthesia Care

## 2021-03-24 MED ORDER — PROPOFOL 1000 MG/100ML IV EMUL
INTRAVENOUS | Status: AC
  Filled 2021-03-24: qty 100

## 2021-03-24 MED ORDER — LIDOCAINE HCL 1 % IJ SOLN
INTRAMUSCULAR | Status: DC | PRN
  Administered 2021-03-24: 50 mg via INTRADERMAL

## 2021-03-24 MED ORDER — LACTATED RINGERS IV SOLN
INTRAVENOUS | Status: DC
  Administered 2021-03-24: 11:00:00 1000 mL via INTRAVENOUS

## 2021-03-24 MED ORDER — PROPOFOL 500 MG/50ML IV EMUL
INTRAVENOUS | Status: AC
  Filled 2021-03-24: qty 50

## 2021-03-24 MED ORDER — SODIUM CHLORIDE 0.9 % IV SOLN: INTRAVENOUS | Status: DC

## 2021-03-24 MED ORDER — PROPOFOL 500 MG/50ML IV EMUL
INTRAVENOUS | Status: DC | PRN
  Administered 2021-03-24: 125 ug/kg/min via INTRAVENOUS

## 2021-03-24 MED ORDER — PROPOFOL 10 MG/ML IV BOLUS
INTRAVENOUS | Status: DC | PRN
  Administered 2021-03-24: 10 mg via INTRAVENOUS

## 2021-03-24 SURGICAL SUPPLY — 22 items

## 2021-03-24 NOTE — Transfer of Care (Signed)
Immediate Anesthesia Transfer of Care Note  Patient: John [name]  Procedure(s) Performed: COLONOSCOPY WITH PROPOFOL BIOPSY  Patient Location: PACU and Endoscopy Unit  Anesthesia Type:MAC  Level of Consciousness: drowsy, patient cooperative and responds to stimulation  Airway & Oxygen Therapy: Patient Spontanous Breathing and Patient connected to face mask oxygen  Post-op Assessment: Report given to RN and Post -op Vital signs reviewed and stable  Post vital signs: Reviewed and stable  Last Vitals:  Vitals Value Taken Time  BP 87/52 03/24/21 1254  Temp    Pulse 80 03/24/21 1256  Resp 15 03/24/21 1256  SpO2 99 % 03/24/21 1256  Vitals shown include unvalidated device data.  Last Pain:  Vitals:   03/24/21 1057  TempSrc: Oral  PainSc: 0-No pain         Complications: No notable events documented.

## 2021-03-24 NOTE — Anesthesia Preprocedure Evaluation (Addendum)
Anesthesia Evaluation  Patient identified by MRN, date of birth, ID band Patient awake    Reviewed: Allergy & Precautions, NPO status , Patient's Chart, lab work & pertinent test results  Airway Mallampati: III  TM Distance: >3 FB Neck ROM: Full    Dental no notable dental hx.    Pulmonary sleep apnea , former smoker,    Pulmonary exam normal breath sounds clear to auscultation       Cardiovascular hypertension, Pt. on medications Normal cardiovascular exam Rhythm:Regular Rate:Normal  S/p HEART TRANSPLANT x 2 at [facility]   Neuro/Psych negative neurological ROS  negative psych ROS   GI/Hepatic Neg liver ROS, GERD  Controlled and Medicated,  Endo/Other  diabetes, Insulin Dependent  Renal/GU negative Renal ROS     Musculoskeletal negative musculoskeletal ROS (+)   Abdominal   Peds  Hematology negative hematology ROS (+)   Anesthesia Other Findings Abnormal PET scan  Reproductive/Obstetrics                            Anesthesia Physical Anesthesia Plan  ASA: 3  Anesthesia Plan: MAC   Post-op Pain Management:    Induction: Intravenous  PONV Risk Score and Plan: 1 and Propofol infusion and Treatment may vary due to age or medical condition  Airway Management Planned:   Additional Equipment:   Intra-op Plan:   Post-operative Plan:   Informed Consent: I have reviewed the patients History and Physical, chart, labs and discussed the procedure including the risks, benefits and alternatives for the proposed anesthesia with the patient or authorized representative who has indicated his/her understanding and acceptance.     Dental advisory given  Plan Discussed with: CRNA  Anesthesia Plan Comments:         Anesthesia Quick Evaluation

## 2021-03-24 NOTE — Anesthesia Postprocedure Evaluation (Signed)
Anesthesia Post Note  Patient: John [name]  Procedure(s) Performed: COLONOSCOPY WITH PROPOFOL BIOPSY     Patient location during evaluation: Endoscopy Anesthesia Type: MAC Level of consciousness: awake Pain management: pain level controlled Vital Signs Assessment: post-procedure vital signs reviewed and stable Respiratory status: spontaneous breathing, nonlabored ventilation, respiratory function stable and patient connected to nasal cannula oxygen Cardiovascular status: stable and blood pressure returned to baseline Postop Assessment: no apparent nausea or vomiting Anesthetic complications: no   No notable events documented.  Last Vitals:  Vitals:   03/24/21 1310 03/24/21 1316  BP: 113/74 123/87  Pulse: 82 82  Resp: 14 15  Temp:    SpO2: 94% 96%    Last Pain:  Vitals:   03/24/21 1316  TempSrc:   PainSc: 0-No pain                 Saren Corkern P Cyriah Childrey

## 2021-03-24 NOTE — Discharge Instructions (Signed)

## 2021-03-24 NOTE — H&P (Signed)
Date of Initial H&P: 03/18/21  History reviewed, patient examined, no change in status, stable for surgery.

## 2021-03-24 NOTE — Interval H&P Note (Signed)
History and Physical Interval Note:  03/24/2021 10:59 AM  John [name]  has presented today for surgery, with the diagnosis of Abnormal PET scan.  The various methods of treatment have been discussed with the patient and family. After consideration of risks, benefits and other options for treatment, the patient has consented to  Procedure(s): COLONOSCOPY WITH PROPOFOL (N/A) as a surgical intervention.  The patient's history has been reviewed, patient examined, no change in status, stable for surgery.  I have reviewed the patient's chart and labs.  Questions were answered to the patient's satisfaction.     Lear Ng

## 2021-03-24 NOTE — Op Note (Signed)
[facility] Patient Name: John [name] Procedure Date: 03/24/2021 MRN: [id number] Attending MD: Lear Ng , MD Date of Birth: 10-Aug-1950 CSN: [id number] Age: 70 Admit Type: Outpatient Procedure:                Colonoscopy Indications:              Last colonoscopy: March 2015, Abnormal PET scan of                            the GI tract Providers:                Lear Ng, MD, Doristine Johns, RN,                            Cherylynn Ridges, Technician, Karis Juba, CRNA Referring MD:             Mayra Neer Medicines:                Propofol per Anesthesia, Monitored Anesthesia Care Complications:            No immediate complications. Estimated Blood Loss:     Estimated blood loss was minimal. Procedure:                Pre-Anesthesia Assessment:                           - Prior to the procedure, a History and Physical                            was performed, and patient medications and                            allergies were reviewed. The patient's tolerance of                            previous anesthesia was also reviewed. The risks                            and benefits of the procedure and the sedation                            options and risks were discussed with the patient.                            All questions were answered, and informed consent                            was obtained. Prior Anticoagulants: The patient has                            taken no previous anticoagulant or antiplatelet                            agents. ASA Grade Assessment: III - A patient with  severe systemic disease. After reviewing the risks                            and benefits, the patient was deemed in                            satisfactory condition to undergo the procedure.                           After obtaining informed consent, the colonoscope                            was passed under direct vision.  Throughout the                            procedure, the patient's blood pressure, pulse, and                            oxygen saturations were monitored continuously. The                            [facility] PEDIAT[facility]IC COLONOSCOPE [id number] was                            introduced through the anus and advanced to the the                            cecum, identified by appendiceal orifice and                            ileocecal valve. The colonoscopy was performed                            without difficulty. The patient tolerated the                            procedure well. The quality of the bowel                            preparation was fair. Scope In: 12:34:41 PM Scope Out: 12:47:36 PM Scope Withdrawal Time: 0 hours 9 minutes 1 second  Total Procedure Duration: 0 hours 12 minutes 55 seconds  Findings:      The perianal and digital rectal examinations were normal.      A segmental area of mildly congested and erythematous mucosa was found       in the rectum. Biopsies were taken with a cold forceps for histology.       Estimated blood loss was minimal.      There is no endoscopic evidence of mass in the entire colon.      The terminal ileum appeared normal.      Internal hemorrhoids were found during retroflexion. The hemorrhoids       were small and Grade I (internal hemorrhoids that do not prolapse). Impression:               - Preparation  of the colon was fair.                           - Congested and erythematous mucosa in the rectum.                            Biopsied.                           - The examined portion of the ileum was normal.                           - Internal hemorrhoids. Moderate Sedation:      N/A - MAC procedure Recommendation:           - Patient has a contact number available for                            emergencies. The signs and symptoms of potential                            delayed complications were discussed with the                             patient. Return to normal activities tomorrow.                            Written discharge instructions were provided to the                            patient.                           - Resume previous diet.                           - Await pathology results.                           - Repeat colonoscopy for surveillance based on                            pathology results. Procedure Code(s):        --- Professional ---                           [phone], Colonoscopy, flexible; with biopsy, single                            or multiple Diagnosis Code(s):        --- Professional ---                           R93.3, Abnormal findings on diagnostic imaging of                            other parts of digestive tract  K64.0, First degree hemorrhoids                           K62.89, Other specified diseases of anus and rectum CPT copyright 2019 American Medical Association. All rights reserved. The codes documented in this report are preliminary and upon coder review may  be revised to meet current compliance requirements. Lear Ng, MD 03/24/2021 12:58:38 PM This report has been signed electronically. Number of Addenda: 0

## 2021-03-25 LAB — SURGICAL PATHOLOGY

## 2021-03-31 DIAGNOSIS — E119 Type 2 diabetes mellitus without complications: Secondary | ICD-10-CM | POA: Diagnosis not present

## 2021-03-31 DIAGNOSIS — H21233 Degeneration of iris (pigmentary), bilateral: Secondary | ICD-10-CM | POA: Diagnosis not present

## 2021-03-31 DIAGNOSIS — H40013 Open angle with borderline findings, low risk, bilateral: Secondary | ICD-10-CM | POA: Diagnosis not present

## 2021-03-31 DIAGNOSIS — M1712 Unilateral primary osteoarthritis, left knee: Secondary | ICD-10-CM | POA: Diagnosis present

## 2021-03-31 DIAGNOSIS — H25813 Combined forms of age-related cataract, bilateral: Secondary | ICD-10-CM | POA: Diagnosis not present

## 2021-04-01 DIAGNOSIS — M6281 Muscle weakness (generalized): Secondary | ICD-10-CM | POA: Diagnosis not present

## 2021-04-01 DIAGNOSIS — M62838 Other muscle spasm: Secondary | ICD-10-CM | POA: Diagnosis not present

## 2021-04-01 DIAGNOSIS — N393 Stress incontinence (female) (male): Secondary | ICD-10-CM | POA: Diagnosis not present

## 2021-04-14 DIAGNOSIS — M62838 Other muscle spasm: Secondary | ICD-10-CM | POA: Diagnosis not present

## 2021-04-14 DIAGNOSIS — N393 Stress incontinence (female) (male): Secondary | ICD-10-CM | POA: Diagnosis not present

## 2021-04-14 DIAGNOSIS — M6281 Muscle weakness (generalized): Secondary | ICD-10-CM | POA: Diagnosis not present

## 2021-04-16 ENCOUNTER — Encounter: Payer: Self-pay | Admitting: Radiation Oncology

## 2021-04-16 NOTE — Progress Notes (Addendum)
°  Radiation Oncology         [phone] ________________________________  Name: John [name] MRN: [id number]  Date: 04/16/2021  DOB: 10-19-1950  Chart Note:  We discussed this patient in our multidisciplinary genitourinary conference this morning.  He has a rising PSA after undergoing prostatectomy in 2006 and postoperative radiation for rising PSA in 2012.  His current PSA level is 3.23 with a doubling time of 30 months.  The patient recently underwent PSMA PET scan imaging to assess for enlarged perirectal lymph nodes.  The PSMA PET was negative for recurrent disease.  I reviewed the patient's radiation treatment records at [facility] health from 2012 and confirmed that the patient's prostatic fossa was treated to a total dose of 68.4 Gray in 38 fractions.  However, the pelvic lymph nodes were not included in the target volume.  As such, these enlarged perirectal lymph nodes may require ongoing surveillance and may be amenable to salvage radiation treatment in the event that they are suspected to be or and pathologically confirmed as recurrent prostate cancer.  The current recommendation was to proceed with follow-up pelvic CT in a few months.  If the lymph nodes remain suspicious or enlarged, the patient may undergo standard PET imaging and/or endoscopic ultrasound for pelvic lymph node biopsy.  The recommendations and discussion from the multidisciplinary clinic are not finding recommendations, and since the patient was not personally seen by me or the multidisciplinary team, this discussion is simply to provide additional information rather than a true medical evaluation, impression and plan.  Below is a screenshot from the patient's radiation treatment plan demonstrating the isodose distribution delivered using Tomo therapy.    ________________________________  Sheral Apley. Tammi Klippel, M.D.

## 2021-04-20 DIAGNOSIS — G4733 Obstructive sleep apnea (adult) (pediatric): Secondary | ICD-10-CM | POA: Diagnosis not present

## 2021-04-24 DIAGNOSIS — G4733 Obstructive sleep apnea (adult) (pediatric): Secondary | ICD-10-CM | POA: Diagnosis not present

## 2021-04-28 DIAGNOSIS — G4733 Obstructive sleep apnea (adult) (pediatric): Secondary | ICD-10-CM | POA: Diagnosis not present

## 2021-04-30 ENCOUNTER — Encounter (HOSPITAL_COMMUNITY): Payer: Self-pay | Admitting: Physician Assistant

## 2021-05-03 DIAGNOSIS — M1712 Unilateral primary osteoarthritis, left knee: Secondary | ICD-10-CM | POA: Diagnosis not present

## 2021-05-05 NOTE — Progress Notes (Signed)
.                 Your procedure is scheduled on:            05/18/21   Report to [facility] Main  Entrance   Report to admitting at   [phone]     Call this number if you have problems the morning of surgery [phone]    REMEMBER: NO  SOLID FOOD CANDY OR GUM AFTER MIDNIGHT. CLEAR LIQUIDS UNTIL  0430am         . NOTHING BY MOUTH EXCEPT CLEAR LIQUIDS UNTIL    . PLEASE FINISH ENSURE DRINK    0430am PER SURGEON ORDER  WHICH NEEDS TO BE COMPLETED AT     0430am  .      CLEAR LIQUID DIET   Foods Allowed                                                                    Coffee and tea, regular and decaf                            Fruit ices (not with fruit pulp)                                      Iced Popsicles                                    Carbonated beverages, regular and diet                                    Cranberry, grape and apple juices Sports drinks like Gatorade Lightly seasoned clear broth or consume(fat free) Sugar, honey syrup ___________________________________________________________________      BRUSH YOUR TEETH MORNING OF SURGERY AND RINSE YOUR MOUTH OUT, NO CHEWING GUM CANDY OR MINTS.     Take these medicines the morning of surgery with A SIP OF WATER:  cellcept, omeprazole prograf   DO NOT TAKE ANY DIABETIC MEDICATIONS DAY OF YOUR SURGERY                               You may not have any metal on your body including hair pins and              piercings  Do not wear jewelry, make-up, lotions, powders or perfumes, deodorant             Do not wear nail polish on your fingernails.  Do not shave  48 hours prior to surgery.              Men may shave face and neck.   Do not bring valuables to the hospital. [place].  Contacts, dentures or bridgework may not be worn into surgery.  Leave suitcase in  the car. After surgery it may be brought to your room.     Patients discharged the day of  surgery will not be allowed to drive home. IF YOU ARE HAVING SURGERY AND GOING HOME THE SAME DAY, YOU MUST HAVE AN ADULT TO DRIVE YOU HOME AND BE WITH YOU FOR 24 HOURS. YOU MAY GO HOME BY TAXI OR UBER OR ORTHERWISE, BUT AN ADULT MUST ACCOMPANY YOU HOME AND STAY WITH YOU FOR 24 HOURS.  Name and phone number of your driver:  Special Instructions: N/A              Please read over the following fact sheets you were given: _____________________________________________________________________  [facility] - Preparing for Surgery Before surgery, you can play an important role.  Because skin is not sterile, your skin needs to be as free of germs as possible.  You can reduce the number of germs on your skin by washing with CHG (chlorahexidine gluconate) soap before surgery.  CHG is an antiseptic cleaner which kills germs and bonds with the skin to continue killing germs even after washing. Please DO NOT use if you have an allergy to CHG or antibacterial soaps.  If your skin becomes reddened/irritated stop using the CHG and inform your nurse when you arrive at Short Stay. Do not shave (including legs and underarms) for at least 48 hours prior to the first CHG shower.  You may shave your face/neck. Please follow these instructions carefully:  1.  Shower with CHG Soap the night before surgery and the  morning of Surgery.  2.  If you choose to wash your hair, wash your hair first as usual with your  normal  shampoo.  3.  After you shampoo, rinse your hair and body thoroughly to remove the  shampoo.                           4.  Use CHG as you would any other liquid soap.  You can apply chg directly  to the skin and wash                       Gently with a scrungie or clean washcloth.  5.  Apply the CHG Soap to your body ONLY FROM THE NECK DOWN.   Do not use on face/ open                           Wound or open sores. Avoid contact with eyes, ears mouth and genitals (private parts).                       Wash  face,  Genitals (private parts) with your normal soap.             6.  Wash thoroughly, paying special attention to the area where your surgery  will be performed.  7.  Thoroughly rinse your body with warm water from the neck down.  8.  DO NOT shower/wash with your normal soap after using and rinsing off  the CHG Soap.                9.  Pat yourself dry with a clean towel.            10.  Wear clean pajamas.            11.  Place clean sheets  on your bed the night of your first shower and do not  sleep with pets. Day of Surgery : Do not apply any lotions/deodorants the morning of surgery.  Please wear clean clothes to the hospital/surgery center.  FAILURE TO FOLLOW THESE INSTRUCTIONS MAY RESULT IN THE CANCELLATION OF YOUR SURGERY PATIENT SIGNATURE_________________________________  NURSE SIGNATURE__________________________________  ________________________________________________________________________

## 2021-05-05 NOTE — Progress Notes (Addendum)
Anesthesia Review:  PCP: Mayra Neer- clearance dated 03/26/21 on chart  Transplant Team- clearance- Clearance dated 03/19/21 DR Melvyn Novas  [place] 06/14/20  Cardiologist : none  Chest x-ray : EKG : 06/15/20 - Requested 12 lead ekg tracing by fax from [place] on 05/07/21.  On chart on 05/10/21.  Echo : 06/15/20- Care Everywhere  Stress test: Cardiac Cath : 06/15/20- Care Everywhere  Activity level: can do a flight of stairs without difficulty  Sleep Study/ CPAP : has cpap  Fasting Blood Sugar :      / Checks Blood Sugar -- times a day:   Blood Thinner/ Instructions /Last Dose: ASA / Instructions/ Last Dose :   DM- type 2 Insulin Pump- Omnipod  Hgba1c- 05/07/21 - 8.1 routed to DR Landau on 05/07/21.  81 mg Aspirin  No covid test- ambulatory surgery  Hx of heart transplant x 2- 2000 and 2013.   PT to contact Deirdre Priest in regards to Insulin Pump.  PT knows to let her know the length of surgery and what instructions he needs for Insulin Pump PT voiced  understanding.  Diabetic Coordinator notified, Morrison Old of DOS , time . On 05/07/21     CBC done 05/07/21 routed to DR Marchia Bond. Pricilla Riffle  made aware hgb- 20.0 on 05/07/21.

## 2021-05-07 ENCOUNTER — Encounter (HOSPITAL_COMMUNITY)
Admission: RE | Admit: 2021-05-07 | Discharge: 2021-05-07 | Disposition: A | Discharge status: Home / Self Care | Payer: Medicare HMO | Source: Ambulatory Visit | Attending: Orthopedic Surgery | Admitting: Orthopedic Surgery

## 2021-05-07 ENCOUNTER — Other Ambulatory Visit: Payer: Self-pay

## 2021-05-07 ENCOUNTER — Encounter (HOSPITAL_COMMUNITY): Payer: Self-pay

## 2021-05-07 VITALS — BP 132/88 | HR 85 | Temp 98.2°F | Resp 16 | Ht 65.5 in | Wt 163.1 lb

## 2021-05-07 DIAGNOSIS — Z01818 Encounter for other preprocedural examination: Secondary | ICD-10-CM

## 2021-05-07 DIAGNOSIS — E119 Type 2 diabetes mellitus without complications: Secondary | ICD-10-CM | POA: Insufficient documentation

## 2021-05-07 DIAGNOSIS — Z01812 Encounter for preprocedural laboratory examination: Secondary | ICD-10-CM | POA: Diagnosis present

## 2021-05-07 HISTORY — DX: Unspecified osteoarthritis, unspecified site: M19.90

## 2021-05-07 LAB — BASIC METABOLIC PANEL
Anion gap: 8 (ref 5–15)
BUN: 16 mg/dL (ref 8–23)
CO2: 27 mmol/L (ref 22–32)
Calcium: 9 mg/dL (ref 8.9–10.3)
Chloride: 100 mmol/L (ref 98–111)
Creatinine, Ser: 0.9 mg/dL (ref 0.61–1.24)
GFR, Estimated: >60 mL/min (ref >60)
Glucose, Bld: 93 mg/dL (ref 70–99)
Potassium: 4.2 mmol/L (ref 3.5–5.1)
Sodium: 135 mmol/L (ref 135–145)

## 2021-05-07 LAB — HEMOGLOBIN A1C
Hgb A1c MFr Bld: 8.1 % — ABNORMAL HIGH (ref 4.8–5.6)
Mean Plasma Glucose: 185.77 mg/dL

## 2021-05-07 LAB — SURGICAL PCR SCREEN
MRSA, PCR: NEGATIVE
Staphylococcus aureus: NEGATIVE

## 2021-05-07 LAB — CBC
HCT: 58.4 % — ABNORMAL HIGH (ref 39.0–52.0)
Hemoglobin: 20 g/dL — ABNORMAL HIGH (ref 13.0–17.0)
MCH: 30 pg (ref 26.0–34.0)
MCHC: 34.2 g/dL (ref 30.0–36.0)
MCV: 87.6 fL (ref 80.0–100.0)
Platelets: 162 10*3/uL (ref 150–400)
RBC: 6.67 MIL/uL — ABNORMAL HIGH (ref 4.22–5.81)
RDW: 13.5 % (ref 11.5–15.5)
WBC: 5 10*3/uL (ref 4.0–10.5)
nRBC: 0 % (ref 0.0–0.2)

## 2021-05-07 LAB — GLUCOSE, CAPILLARY: Glucose-Capillary: 118 mg/dL — ABNORMAL HIGH (ref 70–99)

## 2021-05-18 ENCOUNTER — Ambulatory Visit (HOSPITAL_COMMUNITY): Admission: RE | Admit: 2021-05-18 | Payer: Medicare HMO | Source: Home / Self Care | Admitting: Orthopedic Surgery

## 2021-05-18 ENCOUNTER — Encounter (HOSPITAL_COMMUNITY): Admission: RE | Payer: Self-pay | Source: Home / Self Care

## 2021-05-18 DIAGNOSIS — M1712 Unilateral primary osteoarthritis, left knee: Secondary | ICD-10-CM | POA: Diagnosis present

## 2021-05-18 SURGERY — ARTHROPLASTY, KNEE, UNICOMPARTMENTAL
Anesthesia: Choice | Site: Knee | Laterality: Left

## 2021-05-21 DIAGNOSIS — E119 Type 2 diabetes mellitus without complications: Secondary | ICD-10-CM | POA: Diagnosis not present

## 2021-05-21 DIAGNOSIS — G4733 Obstructive sleep apnea (adult) (pediatric): Secondary | ICD-10-CM | POA: Diagnosis not present

## 2021-05-25 ENCOUNTER — Other Ambulatory Visit (HOSPITAL_COMMUNITY): Payer: Self-pay | Admitting: Urology

## 2021-05-25 DIAGNOSIS — C61 Malignant neoplasm of prostate: Secondary | ICD-10-CM

## 2021-05-25 DIAGNOSIS — G4733 Obstructive sleep apnea (adult) (pediatric): Secondary | ICD-10-CM | POA: Diagnosis not present

## 2021-05-28 ENCOUNTER — Other Ambulatory Visit: Payer: Self-pay

## 2021-05-28 ENCOUNTER — Encounter (HOSPITAL_COMMUNITY)
Admission: RE | Admit: 2021-05-28 | Discharge: 2021-05-28 | Disposition: A | Discharge status: Home / Self Care | Payer: Medicare HMO | Source: Ambulatory Visit | Attending: Urology | Admitting: Urology

## 2021-05-28 DIAGNOSIS — C61 Malignant neoplasm of prostate: Secondary | ICD-10-CM | POA: Insufficient documentation

## 2021-05-28 DIAGNOSIS — R590 Localized enlarged lymph nodes: Secondary | ICD-10-CM | POA: Diagnosis not present

## 2021-05-28 MED ORDER — PIFLIFOLASTAT F 18 (PYLARIFY) INJECTION
9.0000 | Freq: Once | INTRAVENOUS | Status: AC
  Administered 2021-05-28: 9.78 via INTRAVENOUS

## 2021-06-04 DIAGNOSIS — H6123 Impacted cerumen, bilateral: Secondary | ICD-10-CM | POA: Diagnosis not present

## 2021-06-18 DIAGNOSIS — Z794 Long term (current) use of insulin: Secondary | ICD-10-CM | POA: Diagnosis not present

## 2021-06-18 DIAGNOSIS — D849 Immunodeficiency, unspecified: Secondary | ICD-10-CM | POA: Diagnosis not present

## 2021-06-18 DIAGNOSIS — R972 Elevated prostate specific antigen [PSA]: Secondary | ICD-10-CM | POA: Diagnosis not present

## 2021-06-18 DIAGNOSIS — Z941 Heart transplant status: Secondary | ICD-10-CM | POA: Diagnosis not present

## 2021-06-18 DIAGNOSIS — C61 Malignant neoplasm of prostate: Secondary | ICD-10-CM | POA: Diagnosis not present

## 2021-06-18 DIAGNOSIS — N183 Chronic kidney disease, stage 3 unspecified: Secondary | ICD-10-CM | POA: Diagnosis not present

## 2021-06-18 DIAGNOSIS — N2889 Other specified disorders of kidney and ureter: Secondary | ICD-10-CM | POA: Diagnosis not present

## 2021-06-18 DIAGNOSIS — Z9641 Presence of insulin pump (external) (internal): Secondary | ICD-10-CM | POA: Diagnosis not present

## 2021-06-18 DIAGNOSIS — E119 Type 2 diabetes mellitus without complications: Secondary | ICD-10-CM | POA: Diagnosis not present

## 2021-06-18 DIAGNOSIS — I151 Hypertension secondary to other renal disorders: Secondary | ICD-10-CM | POA: Diagnosis not present

## 2021-06-18 DIAGNOSIS — Z125 Encounter for screening for malignant neoplasm of prostate: Secondary | ICD-10-CM | POA: Diagnosis not present

## 2021-06-18 DIAGNOSIS — G4733 Obstructive sleep apnea (adult) (pediatric): Secondary | ICD-10-CM | POA: Diagnosis not present

## 2021-06-18 DIAGNOSIS — Z79899 Other long term (current) drug therapy: Secondary | ICD-10-CM | POA: Diagnosis not present

## 2021-06-18 DIAGNOSIS — Z48298 Encounter for aftercare following other organ transplant: Secondary | ICD-10-CM | POA: Diagnosis not present

## 2021-06-18 DIAGNOSIS — I129 Hypertensive chronic kidney disease with stage 1 through stage 4 chronic kidney disease, or unspecified chronic kidney disease: Secondary | ICD-10-CM | POA: Diagnosis not present

## 2021-06-18 DIAGNOSIS — Z298 Encounter for other specified prophylactic measures: Secondary | ICD-10-CM | POA: Diagnosis not present

## 2021-06-18 DIAGNOSIS — D84821 Immunodeficiency due to drugs: Secondary | ICD-10-CM | POA: Diagnosis not present

## 2021-06-18 DIAGNOSIS — Z8546 Personal history of malignant neoplasm of prostate: Secondary | ICD-10-CM | POA: Diagnosis not present

## 2021-06-18 DIAGNOSIS — Z4821 Encounter for aftercare following heart transplant: Secondary | ICD-10-CM | POA: Diagnosis not present

## 2021-06-21 DIAGNOSIS — G4733 Obstructive sleep apnea (adult) (pediatric): Secondary | ICD-10-CM | POA: Diagnosis not present

## 2021-06-22 DIAGNOSIS — G4733 Obstructive sleep apnea (adult) (pediatric): Secondary | ICD-10-CM | POA: Diagnosis not present

## 2021-07-01 DIAGNOSIS — L820 Inflamed seborrheic keratosis: Secondary | ICD-10-CM | POA: Diagnosis not present

## 2021-07-01 DIAGNOSIS — C4442 Squamous cell carcinoma of skin of scalp and neck: Secondary | ICD-10-CM | POA: Diagnosis not present

## 2021-07-01 DIAGNOSIS — Z85828 Personal history of other malignant neoplasm of skin: Secondary | ICD-10-CM | POA: Diagnosis not present

## 2021-07-01 DIAGNOSIS — L570 Actinic keratosis: Secondary | ICD-10-CM | POA: Diagnosis not present

## 2021-07-19 DIAGNOSIS — G4733 Obstructive sleep apnea (adult) (pediatric): Secondary | ICD-10-CM | POA: Diagnosis not present

## 2021-07-20 DIAGNOSIS — R9721 Rising PSA following treatment for malignant neoplasm of prostate: Secondary | ICD-10-CM | POA: Diagnosis not present

## 2021-07-23 DIAGNOSIS — G4733 Obstructive sleep apnea (adult) (pediatric): Secondary | ICD-10-CM | POA: Diagnosis not present

## 2021-07-26 DIAGNOSIS — M1712 Unilateral primary osteoarthritis, left knee: Secondary | ICD-10-CM | POA: Diagnosis not present

## 2021-07-26 DIAGNOSIS — D751 Secondary polycythemia: Secondary | ICD-10-CM | POA: Diagnosis not present

## 2021-07-27 DIAGNOSIS — C61 Malignant neoplasm of prostate: Secondary | ICD-10-CM | POA: Diagnosis not present

## 2021-07-28 DIAGNOSIS — I10 Essential (primary) hypertension: Secondary | ICD-10-CM | POA: Diagnosis not present

## 2021-07-28 DIAGNOSIS — Z0000 Encounter for general adult medical examination without abnormal findings: Secondary | ICD-10-CM | POA: Diagnosis not present

## 2021-07-28 DIAGNOSIS — C61 Malignant neoplasm of prostate: Secondary | ICD-10-CM | POA: Diagnosis not present

## 2021-07-28 DIAGNOSIS — N182 Chronic kidney disease, stage 2 (mild): Secondary | ICD-10-CM | POA: Diagnosis not present

## 2021-07-28 DIAGNOSIS — E1169 Type 2 diabetes mellitus with other specified complication: Secondary | ICD-10-CM | POA: Diagnosis not present

## 2021-07-28 DIAGNOSIS — D692 Other nonthrombocytopenic purpura: Secondary | ICD-10-CM | POA: Diagnosis not present

## 2021-07-28 DIAGNOSIS — Z941 Heart transplant status: Secondary | ICD-10-CM | POA: Diagnosis not present

## 2021-07-28 DIAGNOSIS — E782 Mixed hyperlipidemia: Secondary | ICD-10-CM | POA: Diagnosis not present

## 2021-07-28 DIAGNOSIS — R69 Illness, unspecified: Secondary | ICD-10-CM | POA: Diagnosis not present

## 2021-07-28 DIAGNOSIS — D751 Secondary polycythemia: Secondary | ICD-10-CM | POA: Diagnosis not present

## 2021-07-28 DIAGNOSIS — K219 Gastro-esophageal reflux disease without esophagitis: Secondary | ICD-10-CM | POA: Diagnosis not present

## 2021-08-16 ENCOUNTER — Telehealth: Payer: Self-pay | Admitting: Physician Assistant

## 2021-08-16 NOTE — Telephone Encounter (Signed)
Scheduled appt per 5/15 referral. Pt is aware of appt date and time. Pt is aware to arrive 15 mins prior to appt time and to bring and updated insurance card. Pt is aware of appt location.   

## 2021-08-18 DIAGNOSIS — G4733 Obstructive sleep apnea (adult) (pediatric): Secondary | ICD-10-CM | POA: Diagnosis not present

## 2021-09-18 DIAGNOSIS — G4733 Obstructive sleep apnea (adult) (pediatric): Secondary | ICD-10-CM | POA: Diagnosis not present

## 2021-09-23 DIAGNOSIS — G4733 Obstructive sleep apnea (adult) (pediatric): Secondary | ICD-10-CM | POA: Diagnosis not present

## 2021-10-19 DIAGNOSIS — G4733 Obstructive sleep apnea (adult) (pediatric): Secondary | ICD-10-CM | POA: Diagnosis not present

## 2021-10-26 DIAGNOSIS — Z794 Long term (current) use of insulin: Secondary | ICD-10-CM | POA: Diagnosis not present

## 2021-10-26 DIAGNOSIS — E118 Type 2 diabetes mellitus with unspecified complications: Secondary | ICD-10-CM | POA: Diagnosis not present

## 2021-10-26 DIAGNOSIS — E1169 Type 2 diabetes mellitus with other specified complication: Secondary | ICD-10-CM | POA: Diagnosis not present

## 2021-10-26 DIAGNOSIS — E11319 Type 2 diabetes mellitus with unspecified diabetic retinopathy without macular edema: Secondary | ICD-10-CM | POA: Diagnosis not present

## 2021-10-26 DIAGNOSIS — E119 Type 2 diabetes mellitus without complications: Secondary | ICD-10-CM | POA: Diagnosis not present

## 2021-11-01 DIAGNOSIS — L821 Other seborrheic keratosis: Secondary | ICD-10-CM | POA: Diagnosis not present

## 2021-11-01 DIAGNOSIS — W540XXA Bitten by dog, initial encounter: Secondary | ICD-10-CM | POA: Diagnosis not present

## 2021-11-01 DIAGNOSIS — Z85828 Personal history of other malignant neoplasm of skin: Secondary | ICD-10-CM | POA: Diagnosis not present

## 2021-11-01 DIAGNOSIS — D044 Carcinoma in situ of skin of scalp and neck: Secondary | ICD-10-CM | POA: Diagnosis not present

## 2021-11-01 DIAGNOSIS — L570 Actinic keratosis: Secondary | ICD-10-CM | POA: Diagnosis not present

## 2021-11-01 NOTE — Progress Notes (Signed)
[place] [phone]   Fax:([phone] [place] NOTE  Patient Care Team: Mayra Neer, MD as PCP - General (Family Medicine)   CHIEF COMPLAINTS/PURPOSE OF CONSULTATION:  Polycythemia  HISTORY OF PRESENTING ILLNESS:  John [name] 71 y.o. male with medical history significant for prostate cancer, myocardial infarction, heart transplant x2, GERD, hypertension, type 2 diabetes and obstructive sleep apnea on CPAP.  Patient is unaccompanied for this visit.  On review of the previous records, Mr. Goth has evidence of longstanding polycythemia as far back as 2008.  Most recent labs from 06/18/2021 showed hemoglobin 8.9, MCV 89.   On exam today, Mr. Maura reports his energy levels have improved since using the CPAP machine for the last 1 to 1-1/2 years.  He adds that his sleep has improved as well.  He has a good appetite and denies any weight changes.  He denies nausea, vomiting or abdominal pain.  His bowel habits are unchanged without any recurrent episodes of diarrhea or constipation.  He denies easy bruising or signs of active bleeding.  He denies any fevers, chills, night sweats, shortness of breath, chest pain or cough.  He has no other complaints.  Rest of the 10 point ROS is below.  MEDICAL HISTORY:  Past Medical History:  Diagnosis Date   Arthritis    Cancer ([place])    prostate   Complication of anesthesia    Problems with waking up and with intubation   Diabetes mellitus without complication ([place])    GERD (gastroesophageal reflux disease)    Hypertension    Myocardial infarction [facility])    Sleep apnea    cpap    SURGICAL HISTORY: Past Surgical History:  Procedure Laterality Date   APPENDECTOMY     BIOPSY  03/24/2021   Procedure: BIOPSY;  Surgeon: Wilford Corner, MD;  Location: [facility];  Service: Endoscopy;;   CARDIAC CATHETERIZATION     Novemer 2014   COLONOSCOPY     COLONOSCOPY WITH PROPOFOL N/A 06/03/2013   Procedure:  COLONOSCOPY WITH PROPOFOL;  Surgeon: Lear Ng, MD;  Location: [facility];  Service: Endoscopy;  Laterality: N/A;   COLONOSCOPY WITH PROPOFOL N/A 03/24/2021   Procedure: COLONOSCOPY WITH PROPOFOL;  Surgeon: Wilford Corner, MD;  Location: [facility];  Service: Endoscopy;  Laterality: N/A;   ESOPHAGOGASTRODUODENOSCOPY (EGD) WITH PROPOFOL N/A 06/03/2013   Procedure: ESOPHAGOGASTRODUODENOSCOPY (EGD) WITH PROPOFOL;  Surgeon: Lear Ng, MD;  Location: [facility];  Service: Endoscopy;  Laterality: N/A;   HEART TRANSPLANT     Hx; of x 2, 2000 and 2013   INGUINAL HERNIA REPAIR Right 08/24/2016   Procedure: LAPAROSCOPIC  RIGHT INGUINAL HERNIA WITH MESH;  Surgeon: Ralene Ok, MD;  Location: [place];  Service: General;  Laterality: Right;   PROSTATECTOMY      SOCIAL HISTORY: Social History   Socioeconomic History   Marital status: Married    Spouse name: Not on file   Number of children: Not on file   Years of education: Not on file   Highest education level: Not on file  Occupational History   Not on file  Tobacco Use   Smoking status: Former    Packs/day: 1.00    Years: 40.00    Total pack years: 40.00    Types: Cigarettes    Quit date: 63    Years since quitting: 25.5   Smokeless tobacco: Never   Tobacco comments:    quit smoking cigarettes in [place] Use  Vaping Use: Never used  Substance and Sexual Activity   Alcohol use: Yes    Comment: rare   Drug use: No   Sexual activity: Not on file  Other Topics Concern   Not on file  Social History Narrative   Not on file   Social Determinants of Health   Financial Resource Strain: Not on file  Food Insecurity: Not on file  Transportation Needs: Not on file  Physical Activity: Not on file  Stress: Not on file  Social Connections: Not on file  Intimate Partner Violence: Not on file    FAMILY HISTORY: Family History  Problem Relation Age of Onset   Heart disease Mother    Heart  disease Father    Diabetes Brother    Cancer Brother    Cancer Niece    Cancer Niece     ALLERGIES:  is allergic to metformin and related.  MEDICATIONS:  Current Outpatient Medications  Medication Sig Dispense Refill   ACCU-CHEK FASTCLIX LANCETS MISC Use to check blood sugar 6 times per day dx code E11.9 204 each 3   acetaminophen (TYLENOL) 325 MG tablet Take 2 tablets (650 mg total) by mouth every 6 (six) hours as needed for mild pain (or Fever >/= 101).     aspirin 81 MG tablet Take 81 mg by mouth daily.     Calcium Carb-Cholecalciferol (CALCIUM 600/VITAMIN D PO) Take 1 tablet by mouth in the morning and at bedtime.     citalopram (CELEXA) 20 MG tablet Take 20 mg by mouth at bedtime.     glucose blood (FREESTYLE TEST STRIPS) test strip Use as instructed to check blood sugar 6 times per day dx code E11.9 200 each 3   insulin aspart (NOVOLOG) 100 UNIT/ML injection Inject 160 Units into the skin See admin instructions. About every 3 days with Omnipod insulin pump     Insulin Disposable Pump (OMNIPOD) MISC Use every 3 days 30 each 2   lisinopril (PRINIVIL,ZESTRIL) 10 MG tablet Take 10 mg by mouth daily.     mycophenolate (CELLCEPT) 500 MG tablet Take 1,000 mg by mouth 2 (two) times daily.     Omega-3 Fatty Acids (FISH OIL) 1200 MG CAPS Take 2,400 mg by mouth 2 (two) times daily.     omeprazole (PRILOSEC) 20 MG capsule Take 20 mg by mouth daily.     rosuvastatin (CRESTOR) 10 MG tablet Take 10 mg by mouth daily.     tacrolimus (PROGRAF) 0.5 MG capsule Take 0.5 mg by mouth 2 (two) times daily.     No current facility-administered medications for this visit.    REVIEW OF SYSTEMS:   Constitutional: ( - ) fevers, ( - )  chills , ( - ) night sweats Eyes: ( - ) blurriness of vision, ( - ) double vision, ( - ) watery eyes Ears, nose, mouth, throat, and face: ( - ) mucositis, ( - ) sore throat Respiratory: ( - ) cough, ( - ) dyspnea, ( - ) wheezes Cardiovascular: ( - ) palpitation, ( - ) chest  discomfort, ( - ) lower extremity swelling Gastrointestinal:  ( - ) nausea, ( - ) heartburn, ( - ) change in bowel habits Skin: ( - ) abnormal skin rashes Lymphatics: ( - ) new lymphadenopathy, ( - ) easy bruising Neurological: ( - ) numbness, ( - ) tingling, ( - ) new weaknesses Behavioral/Psych: ( - ) mood change, ( - ) new changes  All other systems were reviewed with  the patient and are negative.  PHYSICAL EXAMINATION: ECOG PERFORMANCE STATUS: 1 - Symptomatic but completely ambulatory  Vitals:   11/02/21 0904  BP: (!) 142/88  Pulse: 79  Resp: 19  Temp: 98 F (36.7 C)  SpO2: 99%   Filed Weights   11/02/21 0904  Weight: 164 lb 8 oz (74.6 kg)    GENERAL: well appearing male in NAD  SKIN: skin color, texture, turgor are normal, no rashes or significant lesions EYES: conjunctiva are pink and non-injected, sclera clear OROPHARYNX: no exudate, no erythema; lips, buccal mucosa, and tongue normal  NECK: supple, non-tender LYMPH:  no palpable lymphadenopathy in the cervical or supraclavicular lymph nodes.  LUNGS: clear to auscultation and percussion with normal breathing effort HEART: regular rate & rhythm and no murmurs and no lower extremity edema ABDOMEN: soft, non-tender, non-distended, normal bowel sounds Musculoskeletal: no cyanosis of digits and no clubbing  PSYCH: alert & oriented x 3, fluent speech NEURO: no focal motor/sensory deficits  LABORATORY DATA:  I have reviewed the data as listed    Latest Ref Rng & Units 05/07/2021   10:45 AM 08/25/2016    3:14 AM 08/23/2016    4:10 PM  CBC  WBC 4.0 - 10.5 K/uL 5.0  6.5  6.4   Hemoglobin 13.0 - 17.0 g/dL 20.0  15.7  16.5   Hematocrit 39.0 - 52.0 % 58.4  47.5  49.1   Platelets 150 - 400 K/uL 162  158  173        Latest Ref Rng & Units 05/07/2021   10:45 AM 08/25/2016    3:14 AM 08/23/2016    4:10 PM  CMP  Glucose 70 - 99 mg/dL 93  130  121   BUN 8 - 23 mg/dL '16  10  18   '$ Creatinine 0.61 - 1.24 mg/dL 0.90  0.91  0.88    Sodium 135 - 145 mmol/L 135  138  140   Potassium 3.5 - 5.1 mmol/L 4.2  3.7  3.8   Chloride 98 - 111 mmol/L 100  105  106   CO2 22 - 32 mmol/L '27  28  26   '$ Calcium 8.9 - 10.3 mg/dL 9.0  8.3  8.9   Total Protein 6.5 - 8.1 g/dL   5.8   Total Bilirubin 0.3 - 1.2 mg/dL   0.9   Alkaline Phos 38 - 126 U/L   62   AST 15 - 41 U/L   22   ALT 17 - 63 U/L   23    ASSESSMENT & PLAN John [name] is a 71 y.o. male who presents to the hematology clinic for evaluation for polycythemia.  There are two types of polycythemia, Primary polycythemia and secondary polycythemia. Primary polycythemia is overproduction of red blood cells due to a driver mutation. The most common mutation is the JAK2 V617F (95% of cases), but there are other mutations which can cause this disorder. Primary polycythemia is a myeloproliferative neoplasm which may require cytoreductive therapy to decrease risk of thrombosis. This can consist of medications or phlebotomy to drive down the red blood cell counts. Secondary polycythemia is polycythemia driven by low oxygen levels. This represents an appropriate response of the body attempting to increase red cell volume. Causes of secondary polycythemia include smoking (most common), obstructive sleep apnea (OSA), or living at altitude. This can also be caused by testosterone supplementation. Certain thalassemias can present with marked erythrocytosis , but normal hemoglobin. Secondary polycythemia does not have the same level  of thrombotic risk and therefore does not require cytoreductive therapy or phlebotomy.   #Polycythemia: --Patient has obstructive sleep apnea and currently on CPAP for 1-2 years.  --Need to rule out other causes so workup to include CBC, CMP, reticulocyte count, and erythropoietin level   --patient is a non smoker and does not use any testosterone containing products --will order MPN workup to include JAK2 with reflex and BCR/ABL FISH --RTC in 6 months or sooner if  indicated by the above labs.   Orders Placed This Encounter  Procedures   CBC with Differential ([facility] Only)    Standing Status:   Future    Standing Expiration Date:   11/02/2022   CMP ([facility] only)    Standing Status:   Future    Standing Expiration Date:   11/02/2022   Erythropoietin    Standing Status:   Future    Standing Expiration Date:   11/01/2022   BCR ABL1 FISH ([facility])    Standing Status:   Future    Standing Expiration Date:   11/02/2022   JAK2 (INCLUDING V617F AND EXON 12), MPL,& CALR W/RFL MPN PANEL ([facility])    Standing Status:   Future    Standing Expiration Date:   11/01/2022    All questions were answered. The patient knows to call the clinic with any problems, questions or concerns.  I have spent a total of 60 minutes minutes of face-to-face and non-face-to-face time, preparing to see the patient, obtaining and/or reviewing separately obtained history, performing a medically appropriate examination, counseling and educating the patient, ordering tests, documenting clinical information in the electronic health record, and care coordination.   Dede Query, PA-C Department of Hematology/Oncology [place] at [facility] Phone: [phone]  Patient was seen with Dr. Lorenso Courier  I have read the above note and personally examined the patient. I agree with the assessment and plan as noted above.  Briefly John [name] is a 71 year old male with medical history significant for OSA on CPAP who presents for evaluation of polycythemia.  At this time most likely etiology of his polycythemia is an improperly titrated CPAP.  Recommend the patient connect with his sleep medicine provider to readjust his CPAP machine.  In order to assure this is the etiology we will also perform an MPN work-up with JAK2 and BCR/ABL FISH.  We will order erythropoietin levels and repeat CBC today as well.  Plan to see the patient back pending the results of the above  studies.   Ledell Peoples, MD Department of Hematology/Oncology [place] at [facility] Phone: [phone] Pager: [phone] Email: [email or web address]

## 2021-11-02 ENCOUNTER — Inpatient Hospital Stay: Payer: Medicare HMO | Attending: Physician Assistant | Admitting: Physician Assistant

## 2021-11-02 ENCOUNTER — Inpatient Hospital Stay: Payer: Medicare HMO

## 2021-11-02 ENCOUNTER — Other Ambulatory Visit: Payer: Self-pay

## 2021-11-02 ENCOUNTER — Telehealth: Payer: Self-pay | Admitting: Physician Assistant

## 2021-11-02 ENCOUNTER — Encounter: Payer: Self-pay | Admitting: Physician Assistant

## 2021-11-02 VITALS — BP 142/88 | HR 79 | Temp 98.0°F | Resp 19 | Ht 65.5 in | Wt 164.5 lb

## 2021-11-02 DIAGNOSIS — E119 Type 2 diabetes mellitus without complications: Secondary | ICD-10-CM | POA: Diagnosis not present

## 2021-11-02 DIAGNOSIS — Z87891 Personal history of nicotine dependence: Secondary | ICD-10-CM | POA: Diagnosis not present

## 2021-11-02 DIAGNOSIS — D751 Secondary polycythemia: Secondary | ICD-10-CM | POA: Insufficient documentation

## 2021-11-02 DIAGNOSIS — I10 Essential (primary) hypertension: Secondary | ICD-10-CM | POA: Diagnosis not present

## 2021-11-02 DIAGNOSIS — G4733 Obstructive sleep apnea (adult) (pediatric): Secondary | ICD-10-CM | POA: Insufficient documentation

## 2021-11-02 DIAGNOSIS — Z8546 Personal history of malignant neoplasm of prostate: Secondary | ICD-10-CM | POA: Insufficient documentation

## 2021-11-02 LAB — CBC WITH DIFFERENTIAL (CANCER CENTER ONLY)
Abs Immature Granulocytes: 0.01 10*3/uL (ref 0.00–0.07)
Basophils Absolute: 0 10*3/uL (ref 0.0–0.1)
Basophils Relative: 0 %
Eosinophils Absolute: 0.2 10*3/uL (ref 0.0–0.5)
Eosinophils Relative: 4 %
HCT: 52.5 % — ABNORMAL HIGH (ref 39.0–52.0)
Hemoglobin: 18.5 g/dL — ABNORMAL HIGH (ref 13.0–17.0)
Immature Granulocytes: 0 %
Lymphocytes Relative: 28 %
Lymphs Abs: 1.3 10*3/uL (ref 0.7–4.0)
MCH: 31 pg (ref 26.0–34.0)
MCHC: 35.2 g/dL (ref 30.0–36.0)
MCV: 88.1 fL (ref 80.0–100.0)
Monocytes Absolute: 0.4 10*3/uL (ref 0.1–1.0)
Monocytes Relative: 9 %
Neutro Abs: 2.6 10*3/uL (ref 1.7–7.7)
Neutrophils Relative %: 59 %
Platelet Count: 148 10*3/uL — ABNORMAL LOW (ref 150–400)
RBC: 5.96 MIL/uL — ABNORMAL HIGH (ref 4.22–5.81)
RDW: 13.2 % (ref 11.5–15.5)
WBC Count: 4.5 10*3/uL (ref 4.0–10.5)
nRBC: 0 % (ref 0.0–0.2)

## 2021-11-02 LAB — CMP (CANCER CENTER ONLY)
ALT: 30 U/L (ref 0–44)
AST: 24 U/L (ref 15–41)
Albumin: 3.9 g/dL (ref 3.5–5.0)
Alkaline Phosphatase: 55 U/L (ref 38–126)
Anion gap: 3 — ABNORMAL LOW (ref 5–15)
BUN: 18 mg/dL (ref 8–23)
CO2: 31 mmol/L (ref 22–32)
Calcium: 8.8 mg/dL — ABNORMAL LOW (ref 8.9–10.3)
Chloride: 102 mmol/L (ref 98–111)
Creatinine: 1.01 mg/dL (ref 0.61–1.24)
GFR, Estimated: >60 mL/min (ref >60)
Glucose, Bld: 208 mg/dL — ABNORMAL HIGH (ref 70–99)
Potassium: 4.7 mmol/L (ref 3.5–5.1)
Sodium: 136 mmol/L (ref 135–145)
Total Bilirubin: 0.8 mg/dL (ref 0.3–1.2)
Total Protein: 6.3 g/dL — ABNORMAL LOW (ref 6.5–8.1)

## 2021-11-02 NOTE — Telephone Encounter (Signed)
Per 8/1 los called and spoke to py about appointment pt confirmed appointment

## 2021-11-03 LAB — ERYTHROPOIETIN: Erythropoietin: 23.8 m[IU]/mL — ABNORMAL HIGH (ref 2.6–18.5)

## 2021-11-04 DIAGNOSIS — R9721 Rising PSA following treatment for malignant neoplasm of prostate: Secondary | ICD-10-CM | POA: Diagnosis not present

## 2021-11-09 DIAGNOSIS — N5231 Erectile dysfunction following radical prostatectomy: Secondary | ICD-10-CM | POA: Diagnosis not present

## 2021-11-09 DIAGNOSIS — N393 Stress incontinence (female) (male): Secondary | ICD-10-CM | POA: Diagnosis not present

## 2021-11-09 DIAGNOSIS — R9721 Rising PSA following treatment for malignant neoplasm of prostate: Secondary | ICD-10-CM | POA: Diagnosis not present

## 2021-11-19 DIAGNOSIS — G4733 Obstructive sleep apnea (adult) (pediatric): Secondary | ICD-10-CM | POA: Diagnosis not present

## 2021-11-25 DIAGNOSIS — Z794 Long term (current) use of insulin: Secondary | ICD-10-CM | POA: Diagnosis not present

## 2021-11-25 DIAGNOSIS — E119 Type 2 diabetes mellitus without complications: Secondary | ICD-10-CM | POA: Diagnosis not present

## 2021-11-25 DIAGNOSIS — E11319 Type 2 diabetes mellitus with unspecified diabetic retinopathy without macular edema: Secondary | ICD-10-CM | POA: Diagnosis not present

## 2021-11-25 DIAGNOSIS — E1169 Type 2 diabetes mellitus with other specified complication: Secondary | ICD-10-CM | POA: Diagnosis not present

## 2021-11-25 DIAGNOSIS — E118 Type 2 diabetes mellitus with unspecified complications: Secondary | ICD-10-CM | POA: Diagnosis not present

## 2021-11-25 LAB — BCR ABL1 FISH (GENPATH)

## 2021-11-25 LAB — JAK2 (INCLUDING V617F AND EXON 12), MPL,& CALR W/RFL MPN PANEL (NGS)

## 2021-11-26 ENCOUNTER — Telehealth: Payer: Self-pay | Admitting: Physician Assistant

## 2021-11-26 NOTE — Telephone Encounter (Signed)
I called [name] to review the lab results from 11/02/2021.  There is improvement of hemoglobin levels from 20.0 in February 2023 to 18.5.  There was no evidence of myeloproliferative neoplasm.  Likely cause of polycythemia is secondary to obstructive sleep apnea.  Recommend to follow-up with sleep center to recalibrate his CPAP machine.  We will see the patient back in 6 months to repeat labs.  He expressed understanding and satisfaction with the plan provided.

## 2021-12-25 DIAGNOSIS — E1169 Type 2 diabetes mellitus with other specified complication: Secondary | ICD-10-CM | POA: Diagnosis not present

## 2021-12-25 DIAGNOSIS — E119 Type 2 diabetes mellitus without complications: Secondary | ICD-10-CM | POA: Diagnosis not present

## 2021-12-25 DIAGNOSIS — E11319 Type 2 diabetes mellitus with unspecified diabetic retinopathy without macular edema: Secondary | ICD-10-CM | POA: Diagnosis not present

## 2021-12-25 DIAGNOSIS — E118 Type 2 diabetes mellitus with unspecified complications: Secondary | ICD-10-CM | POA: Diagnosis not present

## 2021-12-25 DIAGNOSIS — Z794 Long term (current) use of insulin: Secondary | ICD-10-CM | POA: Diagnosis not present

## 2022-01-12 DIAGNOSIS — G4733 Obstructive sleep apnea (adult) (pediatric): Secondary | ICD-10-CM | POA: Diagnosis not present

## 2022-01-18 DIAGNOSIS — E11319 Type 2 diabetes mellitus with unspecified diabetic retinopathy without macular edema: Secondary | ICD-10-CM | POA: Diagnosis not present

## 2022-01-18 DIAGNOSIS — Z794 Long term (current) use of insulin: Secondary | ICD-10-CM | POA: Diagnosis not present

## 2022-01-18 DIAGNOSIS — E1169 Type 2 diabetes mellitus with other specified complication: Secondary | ICD-10-CM | POA: Diagnosis not present

## 2022-01-18 DIAGNOSIS — E118 Type 2 diabetes mellitus with unspecified complications: Secondary | ICD-10-CM | POA: Diagnosis not present

## 2022-01-18 DIAGNOSIS — E119 Type 2 diabetes mellitus without complications: Secondary | ICD-10-CM | POA: Diagnosis not present

## 2022-01-26 DIAGNOSIS — G4733 Obstructive sleep apnea (adult) (pediatric): Secondary | ICD-10-CM | POA: Diagnosis not present

## 2022-01-26 NOTE — Progress Notes (Signed)
Sent message, via epic in basket, requesting orders in epic from surgeon.  

## 2022-01-31 DIAGNOSIS — E782 Mixed hyperlipidemia: Secondary | ICD-10-CM | POA: Diagnosis not present

## 2022-01-31 DIAGNOSIS — I10 Essential (primary) hypertension: Secondary | ICD-10-CM | POA: Diagnosis not present

## 2022-01-31 DIAGNOSIS — N182 Chronic kidney disease, stage 2 (mild): Secondary | ICD-10-CM | POA: Diagnosis not present

## 2022-01-31 DIAGNOSIS — L309 Dermatitis, unspecified: Secondary | ICD-10-CM | POA: Diagnosis not present

## 2022-01-31 DIAGNOSIS — M1712 Unilateral primary osteoarthritis, left knee: Secondary | ICD-10-CM | POA: Diagnosis not present

## 2022-01-31 DIAGNOSIS — Z23 Encounter for immunization: Secondary | ICD-10-CM | POA: Diagnosis not present

## 2022-01-31 DIAGNOSIS — E1169 Type 2 diabetes mellitus with other specified complication: Secondary | ICD-10-CM | POA: Diagnosis not present

## 2022-01-31 DIAGNOSIS — I700 Atherosclerosis of aorta: Secondary | ICD-10-CM | POA: Diagnosis not present

## 2022-02-01 NOTE — Progress Notes (Signed)
COVID Vaccine Completed:  Yes  Date of COVID positive in last 90 days:  PCP - Derrill Memo, MD Cardiologist -   Chest x-ray -  EKG - 02-02-22 Epic Stress Test - 06-18-21 [facility] ECHO - 06-18-21 Cardiac Cath - 2022 [facility] Pacemaker/ICD device last checked: Spinal Cord Stimulator:  Bowel Prep -   Sleep Study -  CPAP -   Omnipod insulin pump Fasting Blood Sugar -  Checks Blood Sugar _____ times a day  Blood Thinner Instructions: Aspirin Instructions: Last Dose:  Activity level:  Can go up a flight of stairs and perform activities of daily living without stopping and without symptoms of chest pain or shortness of breath.  Able to exercise without symptoms  Unable to go up a flight of stairs without symptoms of     Anesthesia review:  Hx of MI, HTN, DM, OSA.  Hx of heart transplant  Patient denies shortness of breath, fever, cough and chest pain at PAT appointment  Patient verbalized understanding of instructions that were given to them at the PAT appointment. Patient was also instructed that they will need to review over the PAT instructions again at home before surgery.

## 2022-02-01 NOTE — Patient Instructions (Addendum)
SURGICAL WAITING ROOM VISITATION Patients having surgery or a procedure may have no more than 2 support people in the waiting area - these visitors may rotate.   Children under the age of 75 must have an adult with them who is not the patient. If the patient needs to stay at the hospital during part of their recovery, the visitor guidelines for inpatient rooms apply. Pre-op nurse will coordinate an appropriate time for 1 support person to accompany patient in pre-op.  This support person may not rotate.    Please refer to the [facility] website for the visitor guidelines for Inpatients (after your surgery is over and you are in a regular room).      Your procedure is scheduled on: 02-15-22   Report to [facility] Main Entrance    Report to admitting at 5:15 AM   Call this number if you have problems the morning of surgery [phone]   Do not eat food :After Midnight.   After Midnight you may have the following liquids until 4:30 AM DAY OF SURGERY  Water Non-Citrus Juices (without pulp, NO RED) Carbonated Beverages Black Coffee (NO MILK/CREAM OR CREAMERS, sugar ok)  Clear Tea (NO MILK/CREAM OR CREAMERS, sugar ok) regular and decaf                             Plain Jell-O (NO RED)                                           Fruit ices (not with fruit pulp, NO RED)                                     Popsicles (NO RED)                                                               Sports drinks like Gatorade (NO RED)                   The day of surgery:  Drink ONE (1) Pre-Surgery G2 at 4:30 AM the morning of surgery. Drink in one sitting. Do not sip.  This drink was given to you during your hospital  pre-op appointment visit. Nothing else to drink after completing the Pre-Surgery G2.          If you have questions, please contact your surgeon's office.   FOLLOW  ANY ADDITIONAL PRE OP INSTRUCTIONS YOU RECEIVED FROM YOUR SURGEON'S OFFICE!!!     Oral Hygiene is also  important to reduce your risk of infection.                                    Remember - BRUSH YOUR TEETH THE MORNING OF SURGERY WITH YOUR REGULAR TOOTHPASTE   Do NOT smoke after Midnight   Take these medicines the morning of surgery with A SIP OF WATER: Cellcept Omeprazole Rosuvastatin Tacrolimus   How to Manage Your Diabetes Before and After  Surgery  Why is it important to control my blood sugar before and after surgery? Improving blood sugar levels before and after surgery helps healing and can limit problems. A way of improving blood sugar control is eating a healthy diet by:  Eating less sugar and carbohydrates  Increasing activity/exercise  Talking with your doctor about reaching your blood sugar goals High blood sugars (greater than 180 mg/dL) can raise your risk of infections and slow your recovery, so you will need to focus on controlling your diabetes during the weeks before surgery. Make sure that the doctor who takes care of your diabetes knows about your planned surgery including the date and location.  How do I manage my blood sugar before surgery? Check your blood sugar at least 4 times a day, starting 2 days before surgery, to make sure that the level is not too high or low. Check your blood sugar the morning of your surgery when you wake up and every 2 hours until you get to the Short Stay unit. If your blood sugar is less than 70 mg/dL, you will need to treat for low blood sugar: Do not take insulin. Treat a low blood sugar (less than 70 mg/dL) with  cup of clear juice (cranberry or apple), 4 glucose tablets, OR glucose gel. Recheck blood sugar in 15 minutes after treatment (to make sure it is greater than 70 mg/dL). If your blood sugar is not greater than 70 mg/dL on recheck, call [phone] for further instructions. Report your blood sugar to the short stay nurse when you get to Short Stay.  If you are admitted to the hospital after surgery: Your blood sugar  will be checked by the staff and you will probably be given insulin after surgery (instead of oral diabetes medicines) to make sure you have good blood sugar levels. The goal for blood sugar control after surgery is 80-180 mg/dL.   WHAT DO I DO ABOUT MY DIABETES MEDICATION?  Follow instructions from physician regarding [place] and Endorsed by [facility] Patient Education Committee, August 2015   Bring CPAP mask and tubing day of surgery.                              You may not have any metal on your body including, jewelry, and body piercing             Do not wear  lotions, powders, cologne, or deodorant              Men may shave face and neck.   Do not bring valuables to the hospital. [place].   Contacts, dentures or bridgework may not be worn into surgery.   DO NOT [place]. PHARMACY WILL DISPENSE MEDICATIONS LISTED ON YOUR MEDICATION LIST TO YOU DURING YOUR ADMISSION [place]!    Patients discharged on the day of surgery will not be allowed to drive home.  Someone NEEDS to stay with you for the first 24 hours after anesthesia.   Special Instructions: Bring a copy of your healthcare power of attorney and living will documents the day of surgery if you haven't scanned them before.              Please read over the following fact sheets you were given: IF [place]  If you received a COVID test during your pre-op visit  it is requested that you wear a mask when out in public, stay away from anyone that may not be feeling well and notify your surgeon if you develop symptoms. If you test positive for Covid or have been in contact with anyone that has tested positive in the last 10 days please notify you surgeon.  [facility] - Preparing for Surgery Before surgery, you can play an important role.  Because skin is not  sterile, your skin needs to be as free of germs as possible.  You can reduce the number of germs on your skin by washing with CHG (chlorahexidine gluconate) soap before surgery.  CHG is an antiseptic cleaner which kills germs and bonds with the skin to continue killing germs even after washing. Please DO NOT use if you have an allergy to CHG or antibacterial soaps.  If your skin becomes reddened/irritated stop using the CHG and inform your nurse when you arrive at Short Stay. Do not shave (including legs and underarms) for at least 48 hours prior to the first CHG shower.  You may shave your face/neck.  Please follow these instructions carefully:  1.  Shower with CHG Soap the night before surgery and the  morning of surgery.  2.  If you choose to wash your hair, wash your hair first as usual with your normal  shampoo.  3.  After you shampoo, rinse your hair and body thoroughly to remove the shampoo.                             4.  Use CHG as you would any other liquid soap.  You can apply chg directly to the skin and wash.  Gently with a scrungie or clean washcloth.  5.  Apply the CHG Soap to your body ONLY FROM THE NECK DOWN.   Do   not use on face/ open                           Wound or open sores. Avoid contact with eyes, ears mouth and   genitals (private parts).                       Wash face,  Genitals (private parts) with your normal soap.             6.  Wash thoroughly, paying special attention to the area where your    surgery  will be performed.  7.  Thoroughly rinse your body with warm water from the neck down.  8.  DO NOT shower/wash with your normal soap after using and rinsing off the CHG Soap.                9.  Pat yourself dry with a clean towel.            10.  Wear clean pajamas.            11.  Place clean sheets on your bed the night of your first shower and do not  sleep with pets. Day of Surgery : Do not apply any lotions/deodorants the morning of surgery.  Please wear  clean clothes to the hospital/surgery center.  FAILURE TO FOLLOW THESE INSTRUCTIONS MAY RESULT IN THE CANCELLATION OF YOUR SURGERY  PATIENT SIGNATURE_________________________________  NURSE SIGNATURE__________________________________  ________________________________________________________________________  Incentive Spirometer  An incentive spirometer is a tool that can help keep your lungs clear and active. This tool measures how well you are filling your lungs with each breath. Taking long deep breaths may help reverse or decrease the chance of developing breathing (pulmonary) problems (especially infection) following: A long period of time when you are unable to move or be active. BEFORE THE PROCEDURE  If the spirometer includes an indicator to show your best effort, your nurse or respiratory therapist will set it to a desired goal. If possible, sit up straight or lean slightly forward. Try not to slouch. Hold the incentive spirometer in an upright position. INSTRUCTIONS FOR USE  Sit on the edge of your bed if possible, or sit up as far as you can in bed or on a chair. Hold the incentive spirometer in an upright position. Breathe out normally. Place the mouthpiece in your mouth and seal your lips tightly around it. Breathe in slowly and as deeply as possible, raising the piston or the ball toward the top of the column. Hold your breath for 3-5 seconds or for as long as possible. Allow the piston or ball to fall to the bottom of the column. Remove the mouthpiece from your mouth and breathe out normally. Rest for a few seconds and repeat Steps 1 through 7 at least 10 times every 1-2 hours when you are awake. Take your time and take a few normal breaths between deep breaths. The spirometer may include an indicator to show your best effort. Use the indicator as a goal to work toward during each repetition. After each set of 10 deep breaths, practice coughing to be sure your lungs are  clear. If you have an incision (the cut made at the time of surgery), support your incision when coughing by placing a pillow or rolled up towels firmly against it. Once you are able to get out of bed, walk around indoors and cough well. You may stop using the incentive spirometer when instructed by your caregiver.  RISKS AND COMPLICATIONS Take your time so you do not get dizzy or light-headed. If you are in pain, you may need to take or ask for pain medication before doing incentive spirometry. It is harder to take a deep breath if you are having pain. AFTER USE Rest and breathe slowly and easily. It can be helpful to keep track of a log of your progress. Your caregiver can provide you with a simple table to help with this. If you are using the spirometer at home, follow these instructions: [place] IF:  You are having difficultly using the spirometer. You have trouble using the spirometer as often as instructed. Your pain medication is not giving enough relief while using the spirometer. You develop fever of 100.5 F (38.1 C) or higher. SEEK IMMEDIATE MEDICAL CARE IF:  You cough up bloody sputum that had not been present before. You develop fever of 102 F (38.9 C) or greater. You develop worsening pain at or near the incision site. MAKE SURE YOU:  Understand these instructions. Will watch your condition. Will get help right away if you are not doing well or get worse. Document Released: 08/01/2006 Document Revised: 06/13/2011 Document Reviewed: 10/02/2006 [facility] Patient Information 2014 [place], [place].   ________________________________________________________________________

## 2022-02-02 ENCOUNTER — Encounter (HOSPITAL_COMMUNITY)
Admission: RE | Admit: 2022-02-02 | Discharge: 2022-02-02 | Disposition: A | Discharge status: Home / Self Care | Payer: Medicare HMO | Source: Ambulatory Visit | Attending: Orthopedic Surgery | Admitting: Orthopedic Surgery

## 2022-02-02 ENCOUNTER — Other Ambulatory Visit: Payer: Self-pay

## 2022-02-02 ENCOUNTER — Encounter (HOSPITAL_COMMUNITY): Payer: Self-pay

## 2022-02-02 VITALS — BP 128/78 | HR 83 | Temp 98.0°F | Resp 20 | Ht 65.0 in | Wt 162.6 lb

## 2022-02-02 DIAGNOSIS — Z9641 Presence of insulin pump (external) (internal): Secondary | ICD-10-CM | POA: Insufficient documentation

## 2022-02-02 DIAGNOSIS — D751 Secondary polycythemia: Secondary | ICD-10-CM | POA: Insufficient documentation

## 2022-02-02 DIAGNOSIS — Z01818 Encounter for other preprocedural examination: Secondary | ICD-10-CM | POA: Diagnosis not present

## 2022-02-02 DIAGNOSIS — E119 Type 2 diabetes mellitus without complications: Secondary | ICD-10-CM | POA: Insufficient documentation

## 2022-02-02 DIAGNOSIS — Z79899 Other long term (current) drug therapy: Secondary | ICD-10-CM | POA: Insufficient documentation

## 2022-02-02 DIAGNOSIS — I2510 Atherosclerotic heart disease of native coronary artery without angina pectoris: Secondary | ICD-10-CM | POA: Insufficient documentation

## 2022-02-02 DIAGNOSIS — I517 Cardiomegaly: Secondary | ICD-10-CM | POA: Insufficient documentation

## 2022-02-02 DIAGNOSIS — Z794 Long term (current) use of insulin: Secondary | ICD-10-CM | POA: Diagnosis not present

## 2022-02-02 DIAGNOSIS — M1712 Unilateral primary osteoarthritis, left knee: Secondary | ICD-10-CM | POA: Diagnosis not present

## 2022-02-02 DIAGNOSIS — G4730 Sleep apnea, unspecified: Secondary | ICD-10-CM | POA: Insufficient documentation

## 2022-02-02 DIAGNOSIS — N183 Chronic kidney disease, stage 3 unspecified: Secondary | ICD-10-CM | POA: Insufficient documentation

## 2022-02-02 DIAGNOSIS — I4510 Unspecified right bundle-branch block: Secondary | ICD-10-CM | POA: Diagnosis not present

## 2022-02-02 DIAGNOSIS — I129 Hypertensive chronic kidney disease with stage 1 through stage 4 chronic kidney disease, or unspecified chronic kidney disease: Secondary | ICD-10-CM | POA: Insufficient documentation

## 2022-02-02 DIAGNOSIS — Z941 Heart transplant status: Secondary | ICD-10-CM | POA: Insufficient documentation

## 2022-02-02 LAB — CBC
HCT: 54.9 % — ABNORMAL HIGH (ref 39.0–52.0)
Hemoglobin: 18.8 g/dL — ABNORMAL HIGH (ref 13.0–17.0)
MCH: 30.8 pg (ref 26.0–34.0)
MCHC: 34.2 g/dL (ref 30.0–36.0)
MCV: 89.9 fL (ref 80.0–100.0)
Platelets: 143 10*3/uL — ABNORMAL LOW (ref 150–400)
RBC: 6.11 MIL/uL — ABNORMAL HIGH (ref 4.22–5.81)
RDW: 13 % (ref 11.5–15.5)
WBC: 4.2 10*3/uL (ref 4.0–10.5)
nRBC: 0 % (ref 0.0–0.2)

## 2022-02-02 LAB — GLUCOSE, CAPILLARY: Glucose-Capillary: 242 mg/dL — ABNORMAL HIGH (ref 70–99)

## 2022-02-02 LAB — SURGICAL PCR SCREEN
MRSA, PCR: NEGATIVE
Staphylococcus aureus: NEGATIVE

## 2022-02-02 NOTE — Care Plan (Signed)
Ortho Bundle Case Management Note  Patient Details  Name: John [name] MRN: [id number] Date of Birth: 1950/10/12   met with patient in the office for H&P. will discharge to home with family assistance. has equipment. [facility]. discharge instructions discussed and questions answered. appointments confirmed. Patient and MD in agreement with plan. Choice offered                   DME Arranged:    DME Agency:     HH Arranged:    HH Agency:     Additional Comments: Please contact me with any questions of if this plan should need to change.  Ladell Heads,  [place] [facility] Specialist  [phone] 02/02/2022, 2:02 PM

## 2022-02-03 NOTE — Progress Notes (Signed)
Anesthesia Chart Review   Case: [id number] Date/Time: 02/15/22 1323   Procedure: UNICOMPARTMENTAL KNEE (Left: Knee)   Anesthesia type: Choice   Pre-op diagnosis: djd left knee   Location: [facility] ROOM 07 / [facility]   Surgeons: Marchia Bond, MD       DISCUSSION:71 y.o. former smoker with h/o HTN, DM II (pt with insulin pump in place, A1C 7.1 01/31/2022), CAD, s/p heart transplant, polycythemia, sleep apnea, CKD Stage III, left knee djd scheduled for above procedure 02/15/2022 with Dr. Marchia Bond.   Pt last seen by hematology 11/02/2021.  Follows for polycythemia.  Per note, "There is improvement of hemoglobin levels from 20.0 in February 2023 to 18.5.  There was no evidence of myeloproliferative neoplasm.  Likely cause of polycythemia is secondary to obstructive sleep apnea.  Recommend to follow-up with sleep center to recalibrate his CPAP machine.  We will see the patient back in 6 months to repeat labs.  He expressed understanding and satisfaction with the plan provided."  Pt s/p heart transplant last seen by transplant service 06/18/2021.   Stress Echo 06/18/2021, no ischemia, no valvular problems.   Anticipate pt can proceed with planned procedure barring acute status change.   VS: BP 128/78   Pulse 83   Temp 36.7 C (Oral)   Resp 20   Ht '5\' 5"'$  (1.651 m)   Wt 73.8 kg   SpO2 97%   BMI 27.06 kg/m   PROVIDERS: Mayra Neer, MD is PCP   Cardiologist - Lattie Haw, MD  LABS: Labs reviewed: Acceptable for surgery. (all labs ordered are listed, but only abnormal results are displayed)  Labs Reviewed  CBC - Abnormal; Notable for the following components:      Result Value   RBC 6.11 (*)    Hemoglobin 18.8 (*)    HCT 54.9 (*)    Platelets 143 (*)    All other components within normal limits  GLUCOSE, CAPILLARY - Abnormal; Notable for the following components:   Glucose-Capillary 242 (*)    All other components within normal limits  SURGICAL PCR SCREEN      IMAGES:   EKG:   CV: Stress Echo 06/18/2021 INTERPRETATION ---------------------------------------------------------------    NORMAL STRESS TEST.   NORMAL LA PRESSURES WITH NORMAL DIASTOLIC FUNCTION   VALVULAR REGURGITATION: TRIVIAL MR, TRIVIAL TR   NO VALVULAR STENOSIS   RESTING HYPERTENSION - APPROPRIATE RESPONSE  Past Medical History:  Diagnosis Date   Arthritis    Cancer ([place])    prostate   Complication of anesthesia    Problems with waking up and with intubation   Diabetes mellitus without complication ([place])    GERD (gastroesophageal reflux disease)    Hypertension    Myocardial infarction [facility])    Sleep apnea    cpap    Past Surgical History:  Procedure Laterality Date   APPENDECTOMY     BIOPSY  03/24/2021   Procedure: BIOPSY;  Surgeon: Wilford Corner, MD;  [facility];  Service: Endoscopy;;   CARDIAC CATHETERIZATION     Novemer 2014   COLONOSCOPY     COLONOSCOPY WITH PROPOFOL N/A 06/03/2013   Procedure: COLONOSCOPY WITH PROPOFOL;  Surgeon: Lear Ng, MD;  Location: [facility];  Service: Endoscopy;  Laterality: N/A;   COLONOSCOPY WITH PROPOFOL N/A 03/24/2021   Procedure: COLONOSCOPY WITH PROPOFOL;  Surgeon: Wilford Corner, MD;  [facility];  Service: Endoscopy;  Laterality: N/A;   ESOPHAGOGASTRODUODENOSCOPY (EGD) WITH PROPOFOL N/A 06/03/2013   Procedure: ESOPHAGOGASTRODUODENOSCOPY (EGD) WITH PROPOFOL;  Surgeon: Lear Ng, MD;  Location: [facility] ENDOSCOPY;  Service: Endoscopy;  Laterality: N/A;   HEART TRANSPLANT     Hx; of x 2, 2000 and 2013   INGUINAL HERNIA REPAIR Right 08/24/2016   Procedure: LAPAROSCOPIC  RIGHT INGUINAL HERNIA WITH MESH;  Surgeon: Ralene Ok, MD;  Location: [facility];  Service: General;  Laterality: Right;   PROSTATECTOMY      MEDICATIONS:  ACCU-CHEK FASTCLIX LANCETS MISC   aspirin 81 MG tablet   Calcium Carb-Cholecalciferol (CALCIUM 600/VITAMIN D PO)   citalopram (CELEXA) 20 MG  tablet   glucose blood (FREESTYLE TEST STRIPS) test strip   insulin aspart (NOVOLOG) 100 UNIT/ML injection   Insulin Disposable Pump (OMNIPOD) MISC   lisinopril (PRINIVIL,ZESTRIL) 10 MG tablet   mycophenolate (CELLCEPT) 500 MG tablet   Omega-3 Fatty Acids (FISH OIL) 1200 MG CAPS   omeprazole (PRILOSEC) 20 MG capsule   rosuvastatin (CRESTOR) 10 MG tablet   tacrolimus (PROGRAF) 0.5 MG capsule   No current facility-administered medications for this encounter.    John Felix Ward, PA-C [facility] Pre-Surgical Testing [phone]

## 2022-02-03 NOTE — H&P (Signed)
KNEE ARTHROPLASTY ADMISSION H&P  Patient ID: SEAB AXEL MRN: [id number] DOB/AGE: 1950-09-09 71 y.o.  Chief Complaint: left knee pain.  Planned Procedure Date: 02/15/22 Medical Clearance by Mayra Neer   Cardiac Clearance by Melvyn Novas   HPI: John [name] is a 71 y.o. male who presents for evaluation of djd left knee. The patient has a history of pain and functional disability in the left knee due to arthritis and has failed non-surgical conservative treatments for greater than 12 weeks to include NSAID's and/or analgesics, corticosteriod injections, and activity modification.  Onset of symptoms was gradual, starting 8 years ago with gradually worsening course since that time. The patient noted no past surgery on the left knee.  Patient currently rates pain at 4 out of 10 with activity. Patient has worsening of pain with activity and weight bearing and pain that interferes with activities of daily living.  Patient has evidence of joint space narrowing by imaging studies.  There is no active infection.  Past Medical History:  Diagnosis Date   Arthritis    Cancer ([place])    prostate   Complication of anesthesia    Problems with waking up and with intubation   Diabetes mellitus without complication ([place])    GERD (gastroesophageal reflux disease)    Hypertension    Myocardial infarction [facility])    Sleep apnea    cpap   Past Surgical History:  Procedure Laterality Date   APPENDECTOMY     BIOPSY  03/24/2021   Procedure: BIOPSY;  Surgeon: Wilford Corner, MD;  [facility];  Service: Endoscopy;;   CARDIAC CATHETERIZATION     Novemer 2014   COLONOSCOPY     COLONOSCOPY WITH PROPOFOL N/A 06/03/2013   Procedure: COLONOSCOPY WITH PROPOFOL;  Surgeon: Lear Ng, MD;  Location: [facility];  Service: Endoscopy;  Laterality: N/A;   COLONOSCOPY WITH PROPOFOL N/A 03/24/2021   Procedure: COLONOSCOPY WITH PROPOFOL;  Surgeon: Wilford Corner, MD;  [facility];  Service: Endoscopy;  Laterality: N/A;   ESOPHAGOGASTRODUODENOSCOPY (EGD) WITH PROPOFOL N/A 06/03/2013   Procedure: ESOPHAGOGASTRODUODENOSCOPY (EGD) WITH PROPOFOL;  Surgeon: Lear Ng, MD;  Location: [facility];  Service: Endoscopy;  Laterality: N/A;   HEART TRANSPLANT     Hx; of x 2, 2000 and 2013   INGUINAL HERNIA REPAIR Right 08/24/2016   Procedure: LAPAROSCOPIC  RIGHT INGUINAL HERNIA WITH MESH;  Surgeon: Ralene Ok, MD;  Location: [facility];  Service: General;  Laterality: Right;   PROSTATECTOMY     Allergies  Allergen Reactions   Metformin And Related     Can't tolerate   Prior to Admission medications   Medication Sig Start Date End Date Taking? Authorizing Provider  aspirin 81 MG tablet Take 81 mg by mouth daily.   Yes [provider]  Calcium Carb-Cholecalciferol (CALCIUM 600/VITAMIN D PO) Take 1 tablet by mouth in the morning and at bedtime.   Yes [provider]  citalopram (CELEXA) 20 MG tablet Take 20 mg by mouth at bedtime.   Yes [provider]  insulin aspart (NOVOLOG) 100 UNIT/ML injection Inject 160 Units into the skin See admin instructions. About every 3 days with Omnipod insulin pump   Yes [provider]  Insulin Disposable Pump (OMNIPOD) MISC Use every 3 days 09/18/15  Yes Elayne Snare, MD  lisinopril (PRINIVIL,ZESTRIL) 10 MG tablet Take 10 mg by mouth daily.   Yes [provider]  mycophenolate (CELLCEPT) 500 MG tablet Take 500 mg by mouth 2 (two)  times daily.   Yes [provider]  Omega-3 Fatty Acids (FISH OIL) 1200 MG CAPS Take 2,400 mg by mouth 2 (two) times daily.   Yes [provider]  omeprazole (PRILOSEC) 20 MG capsule Take 20 mg by mouth daily.   Yes [provider]  rosuvastatin (CRESTOR) 10 MG tablet Take 10 mg by mouth daily.   Yes [provider]  tacrolimus (PROGRAF) 0.5 MG capsule Take 0.5 mg by mouth 2 (two) times daily.   Yes [provider]  ACCU-CHEK FASTCLIX LANCETS MISC Use to check blood sugar 6 times per day dx code E11.9 08/19/14   Elayne Snare, MD  glucose blood (FREESTYLE TEST STRIPS) test strip Use as instructed to check blood sugar 6 times per day dx code E11.9 08/19/14   Elayne Snare, MD   Social History   Socioeconomic History   Marital status: Married    Spouse name: Not on file   Number of children: Not on file   Years of education: Not on file   Highest education level: Not on file  Occupational History   Not on file  Tobacco Use   Smoking status: Former    Packs/day: 1.00    Years: 40.00    Total pack years: 40.00    Types: Cigarettes    Quit date: 21    Years since quitting: 25.8   Smokeless tobacco: Never   Tobacco comments:    quit smoking cigarettes in 1998  Vaping Use   Vaping Use: Never used  Substance and Sexual Activity   Alcohol use: Yes    Comment: rare   Drug use: No   Sexual activity: Not on file  Other Topics Concern   Not on file  Social History Narrative   Not on file   Social Determinants of Health   Financial Resource Strain: Not on file  Food Insecurity: Not on file  Transportation Needs: Not on file  Physical Activity: Not on file  Stress: Not on file  Social Connections: Not on file   Family History  Problem Relation Age of Onset   Heart disease Mother    Heart disease Father    Diabetes Brother    Cancer Brother    Cancer Niece    Cancer Niece     ROS: Currently denies lightheadedness, dizziness, Fever, chills, CP, SOB.   No personal history of DVT, PE, or CVA. No loose teeth or dentures All other systems have been reviewed and were otherwise currently negative with the exception of those mentioned in the HPI and as above.  Objective: Vitals: Ht: 5' 6.5" Wt: 165 lbs Temp: 97.6 BP: 136/88 Pulse: 84 O2 96% on room air.   Physical Exam: General: Alert, NAD.  Antalgic Gait  HEENT: EOMI, Good Neck Extension  Pulm: No increased work of breathing.  Clear  B/L A/P w/o crackle or wheeze.  CV: RRR, No m/g/r appreciated  GI: soft, NT, ND Neuro: Neuro without gross focal deficit.  Sensation intact distally Skin: No lesions in the area of chief complaint MSK/Surgical Site: left knee w/o redness or effusion.  Medial JLT. ROM 0-125.  5/5 strength in extension and flexion.  +EHL/FHL.  NVI.  Stable varus and valgus stress.    Imaging Review [place] radiographs demonstrate severe degenerative joint disease of the left knee.    Preoperative templating of the joint replacement has been completed, documented, and submitted to the Operating Room personnel in order to optimize intra-operative equipment management.  Assessment:  Anteromedial left knee osteoarthritis    Plan: Plan for Procedure(s): UNICOMPARTMENTAL KNEE  The patient history, physical exam, clinical judgement of the provider and imaging are consistent with end stage degenerative joint disease and unicompartmneatl joint arthroplasty is deemed medically necessary. The treatment options including medical management, injection therapy, and arthroplasty were discussed at length. The risks and benefits of Procedure(s): UNICOMPARTMENTAL KNEE were presented and reviewed.  The risks of nonoperative treatment, versus surgical intervention including but not limited to continued pain, aseptic loosening, stiffness, dislocation/subluxation, infection, bleeding, nerve injury, blood clots, cardiopulmonary complications, morbidity, mortality, among others were discussed. The patient verbalizes understanding and wishes to proceed with the plan.  Patient is being admitted for inpatient treatment for surgery, pain control, PT, prophylactic antibiotics, VTE prophylaxis, progressive ambulation, ADL's and discharge planning.   Dental prophylaxis discussed and recommended for 2 years postoperatively.  The patient does meet the criteria for TXA which will be used perioperatively.   ASA 325 mg will be used  postoperatively for DVT prophylaxis in addition to SCDs, and early ambulation. The patient is planning to be discharged home with OPPT in care of family.     Ventura Bruns, PA-C 02/03/2022 10:19 AM

## 2022-02-03 NOTE — Anesthesia Preprocedure Evaluation (Addendum)
Anesthesia Evaluation  Patient identified by MRN, date of birth, ID band Patient awake    Reviewed: Allergy & Precautions, NPO status , Patient's Chart, lab work & pertinent test results  History of Anesthesia Complications (+) history of anesthetic complications  Airway Mallampati: III  TM Distance: >3 FB Neck ROM: Full    Dental  (+) Dental Advisory Given, Teeth Intact   Pulmonary sleep apnea , former smoker   Pulmonary exam normal breath sounds clear to auscultation       Cardiovascular hypertension, Pt. on medications + Past MI  Normal cardiovascular exam Rhythm:Regular Rate:Normal  S/p HEART TRANSPLANT x 2 at [facility]   Neuro/Psych negative neurological ROS  negative psych ROS   GI/Hepatic Neg liver ROS,GERD  Controlled and Medicated,,  Endo/Other  diabetes, Insulin Dependent    Renal/GU negative Renal ROS     Musculoskeletal  (+) Arthritis ,    Abdominal   Peds  Hematology negative hematology ROS (+)   Anesthesia Other Findings Abnormal PET scan  Reproductive/Obstetrics                             Anesthesia Physical Anesthesia Plan  ASA: 3  Anesthesia Plan: General   Post-op Pain Management: Regional block*, Tylenol PO (pre-op)* and Celebrex PO (pre-op)*   Induction: Intravenous  PONV Risk Score and Plan: 3 and Treatment may vary due to age or medical condition, Ondansetron, Dexamethasone and Midazolam  Airway Management Planned: LMA  Additional Equipment:   Intra-op Plan:   Post-operative Plan: Extubation in OR  Informed Consent: I have reviewed the patients History and Physical, chart, labs and discussed the procedure including the risks, benefits and alternatives for the proposed anesthesia with the patient or authorized representative who has indicated his/her understanding and acceptance.     Dental advisory given  Plan Discussed with: CRNA  Anesthesia Plan  Comments: (See PAT note 02/02/2022)        Anesthesia Quick Evaluation

## 2022-02-08 ENCOUNTER — Encounter: Payer: Medicare HMO | Attending: Family Medicine | Admitting: Nutrition

## 2022-02-08 DIAGNOSIS — E1165 Type 2 diabetes mellitus with hyperglycemia: Secondary | ICD-10-CM | POA: Diagnosis not present

## 2022-02-08 NOTE — Progress Notes (Signed)
Patient reports that his blood sugars are going high after breakfast ever day despite taking bolus 10 minutes before eating.  Ate a bagel with cream cheese this AM 2 hours ago.  He put in 60 carbs (correct amount) and blood sugar now 250 with arrow pointing directly right.  Says blood sugar also high in the evenings.   He is in our glooko, nor in our clarity downloads.  Per his phone, the dexcom clarity and glooko show only 64% time in range, 0% lows, and with patterns of high blood sugar readings after breakfast and 8PM to 3AM.  He reports bolusing before all meals, and counting carbs accurately-by looking foods up on line, or in app.   Carb ratio changed at breakfast from 8 to 7-giving him 1.5u more per breakfast meal.  Timing changed from 4 hours to 3 to aid in corrections at a fast rate. Patient to call me on Monday with blood sugar readings, or sooner, if blood sugar readings drop below 70.  Pt. Is to have surgery next week, and was told to reduce his basal rate by 20%.  He was shown how to do this without taking his pump out of the automated mode.  He was also advised of the need for more basal insulin after surgery due to pain and healing.  He was advised to increase his basal rates by 20% if readings are high after surgery for at least one week.  He had no final questions.

## 2022-02-08 NOTE — Patient Instructions (Addendum)
Always bolus before meals Call Monday with blood sugar readings. Reduced basal rate by 20% on morning of surgery starting at 5AM.

## 2022-02-11 DIAGNOSIS — D692 Other nonthrombocytopenic purpura: Secondary | ICD-10-CM | POA: Diagnosis not present

## 2022-02-11 DIAGNOSIS — L814 Other melanin hyperpigmentation: Secondary | ICD-10-CM | POA: Diagnosis not present

## 2022-02-11 DIAGNOSIS — D225 Melanocytic nevi of trunk: Secondary | ICD-10-CM | POA: Diagnosis not present

## 2022-02-11 DIAGNOSIS — D2261 Melanocytic nevi of right upper limb, including shoulder: Secondary | ICD-10-CM | POA: Diagnosis not present

## 2022-02-11 DIAGNOSIS — D2272 Melanocytic nevi of left lower limb, including hip: Secondary | ICD-10-CM | POA: Diagnosis not present

## 2022-02-11 DIAGNOSIS — D2271 Melanocytic nevi of right lower limb, including hip: Secondary | ICD-10-CM | POA: Diagnosis not present

## 2022-02-11 DIAGNOSIS — L821 Other seborrheic keratosis: Secondary | ICD-10-CM | POA: Diagnosis not present

## 2022-02-11 DIAGNOSIS — Z85828 Personal history of other malignant neoplasm of skin: Secondary | ICD-10-CM | POA: Diagnosis not present

## 2022-02-11 DIAGNOSIS — D2262 Melanocytic nevi of left upper limb, including shoulder: Secondary | ICD-10-CM | POA: Diagnosis not present

## 2022-02-11 DIAGNOSIS — D1801 Hemangioma of skin and subcutaneous tissue: Secondary | ICD-10-CM | POA: Diagnosis not present

## 2022-02-11 DIAGNOSIS — L570 Actinic keratosis: Secondary | ICD-10-CM | POA: Diagnosis not present

## 2022-02-14 NOTE — H&P (Signed)
KNEE ARTHROPLASTY ADMISSION H&P  Patient ID: John [name] MRN: [id number] DOB/AGE: 07-26-1950 71 y.o.  Chief Complaint: left knee pain.  Planned Procedure Date: 02/15/22 Medical Clearance by Dr. Brigitte Pulse   Cardiac Clearance by Dr. Mosetta Pigeon  HPI: John [name] is a 71 y.o. male who presents for evaluation of djd left knee. The patient has a history of pain and functional disability in the left knee due to arthritis and has failed non-surgical conservative treatments for greater than 12 weeks to include NSAID's and/or analgesics, corticosteriod injections, and activity modification.  Onset of symptoms was gradual, starting 8 years ago with gradually worsening course since that time. The patient noted no past surgery on the left knee.  Patient currently rates pain at 4 out of 10 with activity. Patient has worsening of pain with activity and weight bearing and pain that interferes with activities of daily living.  Patient has evidence of joint space narrowing by imaging studies.  There is no active infection.  Past Medical History:  Diagnosis Date   Arthritis    Cancer ([place])    prostate   Complication of anesthesia    Problems with waking up and with intubation   Diabetes mellitus without complication ([place])    GERD (gastroesophageal reflux disease)    Hypertension    Myocardial infarction [facility])    Sleep apnea    cpap   Past Surgical History:  Procedure Laterality Date   APPENDECTOMY     BIOPSY  03/24/2021   Procedure: BIOPSY;  Surgeon: Wilford Corner, MD;  Location: [facility];  Service: Endoscopy;;   CARDIAC CATHETERIZATION     Novemer 2014   COLONOSCOPY     COLONOSCOPY WITH PROPOFOL N/A 06/03/2013   Procedure: COLONOSCOPY WITH PROPOFOL;  Surgeon: Lear Ng, MD;  Location: [place];  Service: Endoscopy;  Laterality: N/A;   COLONOSCOPY WITH PROPOFOL N/A 03/24/2021   Procedure: COLONOSCOPY WITH PROPOFOL;  Surgeon: Wilford Corner, MD;  Location: [facility];   Service: Endoscopy;  Laterality: N/A;   ESOPHAGOGASTRODUODENOSCOPY (EGD) WITH PROPOFOL N/A 06/03/2013   Procedure: ESOPHAGOGASTRODUODENOSCOPY (EGD) WITH PROPOFOL;  Surgeon: Lear Ng, MD;  Location: [place];  Service: Endoscopy;  Laterality: N/A;   HEART TRANSPLANT     Hx; of x 2, 2000 and 2013   INGUINAL HERNIA REPAIR Right 08/24/2016   Procedure: LAPAROSCOPIC  RIGHT INGUINAL HERNIA WITH MESH;  Surgeon: Ralene Ok, MD;  Location: [place];  Service: General;  Laterality: Right;   PROSTATECTOMY     Allergies  Allergen Reactions   Metformin And Related     Can't tolerate   Prior to Admission medications   Medication Sig Start Date End Date Taking? Authorizing Provider  aspirin 81 MG tablet Take 81 mg by mouth daily.   Yes [provider]  Calcium Carb-Cholecalciferol (CALCIUM 600/VITAMIN D PO) Take 1 tablet by mouth in the morning and at bedtime.   Yes [provider]  citalopram (CELEXA) 20 MG tablet Take 20 mg by mouth at bedtime.   Yes [provider]  insulin aspart (NOVOLOG) 100 UNIT/ML injection Inject 160 Units into the skin See admin instructions. About every 3 days with Omnipod insulin pump   Yes [provider]  Insulin Disposable Pump (OMNIPOD) MISC Use every 3 days 09/18/15  Yes Elayne Snare, MD  lisinopril (PRINIVIL,ZESTRIL) 10 MG tablet Take 10 mg by mouth daily.   Yes [provider]  mycophenolate (CELLCEPT) 500 MG tablet Take 500 mg by mouth 2 (two) times  daily.   Yes [provider]  Omega-3 Fatty Acids (FISH OIL) 1200 MG CAPS Take 2,400 mg by mouth 2 (two) times daily.   Yes [provider]  omeprazole (PRILOSEC) 20 MG capsule Take 20 mg by mouth daily.   Yes [provider]  rosuvastatin (CRESTOR) 10 MG tablet Take 10 mg by mouth daily.   Yes [provider]  tacrolimus (PROGRAF) 0.5 MG capsule Take 0.5 mg by mouth 2 (two) times daily.   Yes [provider]   ACCU-CHEK FASTCLIX LANCETS MISC Use to check blood sugar 6 times per day dx code E11.9 08/19/14   Elayne Snare, MD  glucose blood (FREESTYLE TEST STRIPS) test strip Use as instructed to check blood sugar 6 times per day dx code E11.9 08/19/14   Elayne Snare, MD   Social History   Socioeconomic History   Marital status: Married    Spouse name: Not on file   Number of children: Not on file   Years of education: Not on file   Highest education level: Not on file  Occupational History   Not on file  Tobacco Use   Smoking status: Former    Packs/day: 1.00    Years: 40.00    Total pack years: 40.00    Types: Cigarettes    Quit date: 83    Years since quitting: 25.8   Smokeless tobacco: Never   Tobacco comments:    quit smoking cigarettes in 1998  Vaping Use   Vaping Use: Never used  Substance and Sexual Activity   Alcohol use: Yes    Comment: rare   Drug use: No   Sexual activity: Not on file  Other Topics Concern   Not on file  Social History Narrative   Not on file   Social Determinants of Health   Financial Resource Strain: Not on file  Food Insecurity: Not on file  Transportation Needs: Not on file  Physical Activity: Not on file  Stress: Not on file  Social Connections: Not on file   Family History  Problem Relation Age of Onset   Heart disease Mother    Heart disease Father    Diabetes Brother    Cancer Brother    Cancer Niece    Cancer Niece     ROS: Currently denies lightheadedness, dizziness, Fever, chills, CP, SOB.   No personal history of DVT, PE, or CVA.Hx of MI. No loose teeth or dentures All other systems have been reviewed and were otherwise currently negative with the exception of those mentioned in the HPI and as above.  Objective: Vitals: Ht: '5\' 6"'$  Wt: 164 lbs Temp: 97.8 BP: 130/75 Pulse: 86 O2 94% on room air.   Physical Exam: General: Alert, NAD.  Antalgic Gait  HEENT: EOMI, Good Neck Extension  Pulm: No increased work of breathing.   Clear B/L A/P w/o crackle or wheeze.  CV: RRR, No m/g/r appreciated  GI: soft, NT, ND Neuro: Neuro without gross focal deficit.  Sensation intact distally Skin: No lesions in the area of chief complaint MSK/Surgical Site: left knee w/o redness or effusion.  + medial JLT. ROM 0-130.  5/5 strength in extension and flexion.  +EHL/FHL.  NVI.  Stable varus and valgus stress.    Imaging Review Plain radiographs demonstrate severe degenerative joint disease of the left knee.   The overall alignment is varus. The bone quality appears to be adequate for age and reported activity level.  Preoperative templating of the joint replacement  has been completed, documented, and submitted to the Operating Room personnel in order to optimize intra-operative equipment management.  Assessment: djd left knee Active Problems:   * No active hospital problems. *   Plan: Plan for Procedure(s): UNICOMPARTMENTAL KNEE  The patient history, physical exam, clinical judgement of the provider and imaging are consistent with end stage degenerative joint disease and unicompartmental joint arthroplasty is deemed medically necessary. The treatment options including medical management, injection therapy, and arthroplasty were discussed at length. The risks and benefits of Procedure(s): UNICOMPARTMENTAL KNEE were presented and reviewed.  The risks of nonoperative treatment, versus surgical intervention including but not limited to continued pain, aseptic loosening, stiffness, dislocation/subluxation, infection, bleeding, nerve injury, blood clots, cardiopulmonary complications, morbidity, mortality, among others were discussed. The patient verbalizes understanding and wishes to proceed with the plan.   Dental prophylaxis discussed and recommended for 2 years postoperatively.  ASA 325 mg will be used postoperatively for DVT prophylaxis in addition to SCDs, and early ambulation. The patient is planning to be discharged home  with OPPT in care of wife   Jola Baptist 02/14/2022 12:58 PM

## 2022-02-15 ENCOUNTER — Encounter (HOSPITAL_COMMUNITY): Payer: Self-pay | Admitting: Orthopedic Surgery

## 2022-02-15 ENCOUNTER — Ambulatory Visit (HOSPITAL_COMMUNITY): Payer: Medicare HMO | Admitting: Physician Assistant

## 2022-02-15 ENCOUNTER — Ambulatory Visit (HOSPITAL_BASED_OUTPATIENT_CLINIC_OR_DEPARTMENT_OTHER): Payer: Medicare HMO | Admitting: Anesthesiology

## 2022-02-15 ENCOUNTER — Other Ambulatory Visit: Payer: Self-pay

## 2022-02-15 ENCOUNTER — Observation Stay (HOSPITAL_COMMUNITY)
Admission: RE | Admit: 2022-02-15 | Discharge: 2022-02-17 | Disposition: A | Discharge status: Home / Self Care | Payer: Medicare HMO | Attending: Orthopedic Surgery | Admitting: Orthopedic Surgery

## 2022-02-15 ENCOUNTER — Encounter (HOSPITAL_COMMUNITY)
Admission: RE | Disposition: A | Discharge status: Home / Self Care | Payer: Self-pay | Source: Home / Self Care | Attending: Orthopedic Surgery

## 2022-02-15 ENCOUNTER — Observation Stay (HOSPITAL_COMMUNITY): Payer: Medicare HMO

## 2022-02-15 DIAGNOSIS — I10 Essential (primary) hypertension: Secondary | ICD-10-CM

## 2022-02-15 DIAGNOSIS — Z7982 Long term (current) use of aspirin: Secondary | ICD-10-CM | POA: Insufficient documentation

## 2022-02-15 DIAGNOSIS — Z96652 Presence of left artificial knee joint: Secondary | ICD-10-CM

## 2022-02-15 DIAGNOSIS — I252 Old myocardial infarction: Secondary | ICD-10-CM | POA: Diagnosis not present

## 2022-02-15 DIAGNOSIS — Z471 Aftercare following joint replacement surgery: Secondary | ICD-10-CM | POA: Diagnosis not present

## 2022-02-15 DIAGNOSIS — M25462 Effusion, left knee: Secondary | ICD-10-CM | POA: Diagnosis not present

## 2022-02-15 DIAGNOSIS — Z79899 Other long term (current) drug therapy: Secondary | ICD-10-CM | POA: Diagnosis not present

## 2022-02-15 DIAGNOSIS — Z794 Long term (current) use of insulin: Secondary | ICD-10-CM | POA: Insufficient documentation

## 2022-02-15 DIAGNOSIS — Z87891 Personal history of nicotine dependence: Secondary | ICD-10-CM | POA: Insufficient documentation

## 2022-02-15 DIAGNOSIS — M1712 Unilateral primary osteoarthritis, left knee: Secondary | ICD-10-CM

## 2022-02-15 DIAGNOSIS — Z8546 Personal history of malignant neoplasm of prostate: Secondary | ICD-10-CM | POA: Diagnosis not present

## 2022-02-15 DIAGNOSIS — E119 Type 2 diabetes mellitus without complications: Secondary | ICD-10-CM

## 2022-02-15 DIAGNOSIS — Z943 Heart and lungs transplant status: Secondary | ICD-10-CM | POA: Diagnosis not present

## 2022-02-15 DIAGNOSIS — G4730 Sleep apnea, unspecified: Secondary | ICD-10-CM | POA: Diagnosis not present

## 2022-02-15 DIAGNOSIS — G8918 Other acute postprocedural pain: Secondary | ICD-10-CM | POA: Diagnosis not present

## 2022-02-15 HISTORY — PX: PARTIAL KNEE ARTHROPLASTY: SHX2174

## 2022-02-15 LAB — GLUCOSE, CAPILLARY
Glucose-Capillary: 91 mg/dL (ref 70–99)
Glucose-Capillary: 120 mg/dL — ABNORMAL HIGH (ref 70–99)
Glucose-Capillary: 117 mg/dL — ABNORMAL HIGH (ref 70–99)
Glucose-Capillary: 212 mg/dL — ABNORMAL HIGH (ref 70–99)

## 2022-02-15 SURGERY — ARTHROPLASTY, KNEE, UNICOMPARTMENTAL
Anesthesia: General | Site: Knee | Laterality: Left

## 2022-02-15 MED ORDER — BISACODYL 10 MG RE SUPP: 10.0000 mg | Freq: Every day | RECTAL | Status: DC | PRN

## 2022-02-15 MED ORDER — METOCLOPRAMIDE HCL 5 MG PO TABS: 5.0000 mg | ORAL_TABLET | Freq: Three times a day (TID) | ORAL | Status: DC | PRN

## 2022-02-15 MED ORDER — POVIDONE-IODINE 7.5 % EX SOLN: Freq: Once | CUTANEOUS | Status: DC

## 2022-02-15 MED ORDER — PROMETHAZINE HCL 25 MG/ML IJ SOLN: 6.2500 mg | INTRAMUSCULAR | Status: DC | PRN

## 2022-02-15 MED ORDER — FENTANYL CITRATE (PF) 100 MCG/2ML IJ SOLN
INTRAMUSCULAR | Status: DC | PRN
  Administered 2022-02-15: 50 ug via INTRAVENOUS

## 2022-02-15 MED ORDER — POVIDONE-IODINE 10 % EX SWAB
2.0000 | Freq: Once | CUTANEOUS | Status: AC
  Administered 2022-02-15: 2 via TOPICAL

## 2022-02-15 MED ORDER — KETOROLAC TROMETHAMINE 30 MG/ML IJ SOLN
INTRAMUSCULAR | Status: DC | PRN
  Administered 2022-02-15: 30 mg

## 2022-02-15 MED ORDER — SUCCINYLCHOLINE CHLORIDE 200 MG/10ML IV SOSY
PREFILLED_SYRINGE | INTRAVENOUS | Status: AC
  Filled 2022-02-15: qty 10

## 2022-02-15 MED ORDER — 0.9 % SODIUM CHLORIDE (POUR BTL) OPTIME
TOPICAL | Status: DC | PRN
  Administered 2022-02-15: 1000 mL

## 2022-02-15 MED ORDER — CEFAZOLIN SODIUM-DEXTROSE 2-4 GM/100ML-% IV SOLN
2.0000 g | Freq: Four times a day (QID) | INTRAVENOUS | Status: AC
  Administered 2022-02-15 – 2022-02-16 (×2): 2 g via INTRAVENOUS
  Filled 2022-02-15: qty 100

## 2022-02-15 MED ORDER — PHENYLEPHRINE HCL (PRESSORS) 10 MG/ML IV SOLN
INTRAVENOUS | Status: AC
  Filled 2022-02-15: qty 1

## 2022-02-15 MED ORDER — MAGNESIUM CITRATE PO SOLN: 1.0000 | Freq: Once | ORAL | Status: DC | PRN

## 2022-02-15 MED ORDER — PHENOL 1.4 % MT LIQD: 1.0000 | OROMUCOSAL | Status: DC | PRN

## 2022-02-15 MED ORDER — ROPIVACAINE HCL 5 MG/ML IJ SOLN
INTRAMUSCULAR | Status: DC | PRN
  Administered 2022-02-15: 30 mL via PERINEURAL

## 2022-02-15 MED ORDER — ONDANSETRON HCL 4 MG/2ML IJ SOLN
INTRAMUSCULAR | Status: AC
  Filled 2022-02-15: qty 2

## 2022-02-15 MED ORDER — DOCUSATE SODIUM 100 MG PO CAPS
100.0000 mg | ORAL_CAPSULE | Freq: Two times a day (BID) | ORAL | Status: DC
  Administered 2022-02-15 – 2022-02-17 (×4): 100 mg via ORAL
  Filled 2022-02-15 (×4): qty 1

## 2022-02-15 MED ORDER — CHLORHEXIDINE GLUCONATE 0.12 % MT SOLN
15.0000 mL | Freq: Once | OROMUCOSAL | Status: AC
  Administered 2022-02-15: 15 mL via OROMUCOSAL

## 2022-02-15 MED ORDER — LIDOCAINE HCL (PF) 2 % IJ SOLN
INTRAMUSCULAR | Status: AC
  Filled 2022-02-15: qty 5

## 2022-02-15 MED ORDER — MEPERIDINE HCL 50 MG/ML IJ SOLN: 6.2500 mg | INTRAMUSCULAR | Status: DC | PRN

## 2022-02-15 MED ORDER — METOCLOPRAMIDE HCL 5 MG/ML IJ SOLN: 5.0000 mg | Freq: Three times a day (TID) | INTRAMUSCULAR | Status: DC | PRN

## 2022-02-15 MED ORDER — PHENYLEPHRINE 80 MCG/ML (10ML) SYRINGE FOR IV PUSH (FOR BLOOD PRESSURE SUPPORT)
PREFILLED_SYRINGE | INTRAVENOUS | Status: AC
  Filled 2022-02-15: qty 10

## 2022-02-15 MED ORDER — ACETAMINOPHEN 500 MG PO TABS
ORAL_TABLET | ORAL | Status: AC
  Filled 2022-02-15: qty 2

## 2022-02-15 MED ORDER — OXYCODONE HCL 5 MG PO TABS
5.0000 mg | ORAL_TABLET | ORAL | Status: DC | PRN
  Administered 2022-02-16 – 2022-02-17 (×3): 10 mg via ORAL
  Administered 2022-02-17: 5 mg via ORAL
  Filled 2022-02-15: qty 2
  Filled 2022-02-15: qty 1
  Filled 2022-02-15 (×3): qty 2

## 2022-02-15 MED ORDER — LIDOCAINE 2% (20 MG/ML) 5 ML SYRINGE
INTRAMUSCULAR | Status: DC | PRN
  Administered 2022-02-15: 60 mg via INTRAVENOUS

## 2022-02-15 MED ORDER — LACTATED RINGERS IV SOLN: INTRAVENOUS | Status: DC

## 2022-02-15 MED ORDER — ALUM & MAG HYDROXIDE-SIMETH 200-200-20 MG/5ML PO SUSP: 30.0000 mL | ORAL | Status: DC | PRN

## 2022-02-15 MED ORDER — POVIDONE-IODINE 10 % EX SWAB: 2.0000 | Freq: Once | CUTANEOUS | Status: DC

## 2022-02-15 MED ORDER — ONDANSETRON HCL 4 MG/2ML IJ SOLN
INTRAMUSCULAR | Status: DC | PRN
  Administered 2022-02-15: 4 mg via INTRAVENOUS

## 2022-02-15 MED ORDER — STERILE WATER FOR IRRIGATION IR SOLN
Status: DC | PRN
  Administered 2022-02-15: 1000 mL

## 2022-02-15 MED ORDER — MENTHOL 3 MG MT LOZG: 1.0000 | LOZENGE | OROMUCOSAL | Status: DC | PRN

## 2022-02-15 MED ORDER — TACROLIMUS 0.5 MG PO CAPS
0.5000 mg | ORAL_CAPSULE | Freq: Two times a day (BID) | ORAL | Status: DC
  Administered 2022-02-15 – 2022-02-17 (×4): 0.5 mg via ORAL
  Filled 2022-02-15 (×4): qty 1

## 2022-02-15 MED ORDER — ASPIRIN 325 MG PO TBEC
325.0000 mg | DELAYED_RELEASE_TABLET | Freq: Every day | ORAL | Status: DC
  Administered 2022-02-16: 325 mg via ORAL
  Filled 2022-02-15: qty 1

## 2022-02-15 MED ORDER — PHENYLEPHRINE 80 MCG/ML (10ML) SYRINGE FOR IV PUSH (FOR BLOOD PRESSURE SUPPORT)
PREFILLED_SYRINGE | INTRAVENOUS | Status: DC | PRN
  Administered 2022-02-15 (×3): 80 ug via INTRAVENOUS

## 2022-02-15 MED ORDER — CELECOXIB 200 MG PO CAPS
200.0000 mg | ORAL_CAPSULE | Freq: Once | ORAL | Status: AC
  Administered 2022-02-15: 200 mg via ORAL
  Filled 2022-02-15: qty 1

## 2022-02-15 MED ORDER — DEXAMETHASONE SODIUM PHOSPHATE 4 MG/ML IJ SOLN
INTRAMUSCULAR | Status: DC | PRN
  Administered 2022-02-15: 5 mg via PERINEURAL

## 2022-02-15 MED ORDER — OXYCODONE HCL 5 MG/5ML PO SOLN: 5.0000 mg | Freq: Once | ORAL | Status: DC | PRN

## 2022-02-15 MED ORDER — CITALOPRAM HYDROBROMIDE 20 MG PO TABS
20.0000 mg | ORAL_TABLET | Freq: Every day | ORAL | Status: DC
  Administered 2022-02-15 – 2022-02-16 (×2): 20 mg via ORAL
  Filled 2022-02-15 (×2): qty 1

## 2022-02-15 MED ORDER — HYDROMORPHONE HCL 1 MG/ML IJ SOLN
INTRAMUSCULAR | Status: AC
  Filled 2022-02-15: qty 1

## 2022-02-15 MED ORDER — CEFAZOLIN SODIUM-DEXTROSE 2-4 GM/100ML-% IV SOLN
INTRAVENOUS | Status: AC
  Filled 2022-02-15: qty 100

## 2022-02-15 MED ORDER — PANTOPRAZOLE SODIUM 40 MG PO TBEC
40.0000 mg | DELAYED_RELEASE_TABLET | Freq: Every day | ORAL | Status: DC
  Administered 2022-02-16 – 2022-02-17 (×2): 40 mg via ORAL
  Filled 2022-02-15 (×2): qty 1

## 2022-02-15 MED ORDER — BUPIVACAINE HCL (PF) 0.25 % IJ SOLN
INTRAMUSCULAR | Status: AC
  Filled 2022-02-15: qty 30

## 2022-02-15 MED ORDER — FENTANYL CITRATE (PF) 250 MCG/5ML IJ SOLN
INTRAMUSCULAR | Status: AC
  Filled 2022-02-15: qty 5

## 2022-02-15 MED ORDER — KETOROLAC TROMETHAMINE 30 MG/ML IJ SOLN
INTRAMUSCULAR | Status: AC
  Filled 2022-02-15: qty 1

## 2022-02-15 MED ORDER — MYCOPHENOLATE MOFETIL 250 MG PO CAPS
500.0000 mg | ORAL_CAPSULE | Freq: Two times a day (BID) | ORAL | Status: DC
  Administered 2022-02-15 – 2022-02-17 (×4): 500 mg via ORAL
  Filled 2022-02-15 (×5): qty 2

## 2022-02-15 MED ORDER — HYDROMORPHONE HCL 1 MG/ML IJ SOLN
0.5000 mg | INTRAMUSCULAR | Status: DC | PRN
  Administered 2022-02-15: 1 mg via INTRAVENOUS

## 2022-02-15 MED ORDER — MIDAZOLAM HCL 2 MG/2ML IJ SOLN
2.0000 mg | INTRAMUSCULAR | Status: DC
  Administered 2022-02-15: 1 mg via INTRAVENOUS
  Filled 2022-02-15: qty 2

## 2022-02-15 MED ORDER — SODIUM CHLORIDE 0.9 % IR SOLN
Status: DC | PRN
  Administered 2022-02-15: 1000 mL

## 2022-02-15 MED ORDER — OXYCODONE HCL 5 MG PO TABS: 5.0000 mg | ORAL_TABLET | Freq: Once | ORAL | Status: DC | PRN

## 2022-02-15 MED ORDER — POLYETHYLENE GLYCOL 3350 17 G PO PACK: 17.0000 g | PACK | Freq: Every day | ORAL | Status: DC | PRN

## 2022-02-15 MED ORDER — PROPOFOL 10 MG/ML IV BOLUS
INTRAVENOUS | Status: DC | PRN
  Administered 2022-02-15: 100 mg via INTRAVENOUS
  Administered 2022-02-15: 30 mg via INTRAVENOUS

## 2022-02-15 MED ORDER — PROPOFOL 10 MG/ML IV BOLUS
INTRAVENOUS | Status: AC
  Filled 2022-02-15: qty 20

## 2022-02-15 MED ORDER — INSULIN PUMP
Freq: Three times a day (TID) | SUBCUTANEOUS | Status: DC
  Administered 2022-02-16: 7.3 via SUBCUTANEOUS
  Administered 2022-02-16: 0.5 via SUBCUTANEOUS
  Administered 2022-02-16: 5.85 via SUBCUTANEOUS
  Filled 2022-02-15: qty 1

## 2022-02-15 MED ORDER — CLONIDINE HCL (ANALGESIA) 100 MCG/ML EP SOLN
EPIDURAL | Status: DC | PRN
  Administered 2022-02-15: 80 ug

## 2022-02-15 MED ORDER — PHENYLEPHRINE HCL-NACL 20-0.9 MG/250ML-% IV SOLN
INTRAVENOUS | Status: DC | PRN
  Administered 2022-02-15: 25 ug/min via INTRAVENOUS

## 2022-02-15 MED ORDER — DEXAMETHASONE SODIUM PHOSPHATE 10 MG/ML IJ SOLN
INTRAMUSCULAR | Status: DC | PRN
  Administered 2022-02-15: 5 mg via INTRAVENOUS

## 2022-02-15 MED ORDER — BUPIVACAINE HCL 0.25 % IJ SOLN
INTRAMUSCULAR | Status: DC | PRN
  Administered 2022-02-15: 30 mL

## 2022-02-15 MED ORDER — ACETAMINOPHEN 500 MG PO TABS
1000.0000 mg | ORAL_TABLET | Freq: Once | ORAL | Status: AC
  Administered 2022-02-15: 1000 mg via ORAL
  Filled 2022-02-15: qty 2

## 2022-02-15 MED ORDER — ORAL CARE MOUTH RINSE: 15.0000 mL | Freq: Once | OROMUCOSAL | Status: AC

## 2022-02-15 MED ORDER — ONDANSETRON HCL 4 MG PO TABS: 4.0000 mg | ORAL_TABLET | Freq: Four times a day (QID) | ORAL | Status: DC | PRN

## 2022-02-15 MED ORDER — ACETAMINOPHEN 500 MG PO TABS: 1000.0000 mg | ORAL_TABLET | Freq: Once | ORAL | Status: DC

## 2022-02-15 MED ORDER — DEXAMETHASONE SODIUM PHOSPHATE 10 MG/ML IJ SOLN
INTRAMUSCULAR | Status: AC
  Filled 2022-02-15: qty 1

## 2022-02-15 MED ORDER — ACETAMINOPHEN 325 MG PO TABS: 325.0000 mg | ORAL_TABLET | Freq: Four times a day (QID) | ORAL | Status: DC | PRN

## 2022-02-15 MED ORDER — ACETAMINOPHEN 500 MG PO TABS
1000.0000 mg | ORAL_TABLET | Freq: Four times a day (QID) | ORAL | Status: AC
  Administered 2022-02-15 – 2022-02-16 (×4): 1000 mg via ORAL
  Filled 2022-02-15 (×3): qty 2

## 2022-02-15 MED ORDER — DIPHENHYDRAMINE HCL 12.5 MG/5ML PO ELIX: 12.5000 mg | ORAL_SOLUTION | ORAL | Status: DC | PRN

## 2022-02-15 MED ORDER — HYDROMORPHONE HCL 1 MG/ML IJ SOLN: 0.2500 mg | INTRAMUSCULAR | Status: DC | PRN

## 2022-02-15 MED ORDER — FENTANYL CITRATE PF 50 MCG/ML IJ SOSY
100.0000 ug | PREFILLED_SYRINGE | INTRAMUSCULAR | Status: DC
  Administered 2022-02-15: 50 ug via INTRAVENOUS
  Filled 2022-02-15: qty 2

## 2022-02-15 MED ORDER — ONDANSETRON HCL 4 MG/2ML IJ SOLN: 4.0000 mg | Freq: Four times a day (QID) | INTRAMUSCULAR | Status: DC | PRN

## 2022-02-15 MED ORDER — OXYCODONE HCL 5 MG PO TABS
10.0000 mg | ORAL_TABLET | ORAL | Status: DC | PRN
  Administered 2022-02-16: 10 mg via ORAL

## 2022-02-15 MED ORDER — LISINOPRIL 10 MG PO TABS
10.0000 mg | ORAL_TABLET | Freq: Every day | ORAL | Status: DC
  Administered 2022-02-16 – 2022-02-17 (×2): 10 mg via ORAL
  Filled 2022-02-15 (×2): qty 1

## 2022-02-15 MED ORDER — CEFAZOLIN SODIUM-DEXTROSE 2-4 GM/100ML-% IV SOLN
2.0000 g | INTRAVENOUS | Status: AC
  Administered 2022-02-15: 2 g via INTRAVENOUS
  Filled 2022-02-15: qty 100

## 2022-02-15 MED ORDER — POTASSIUM CHLORIDE IN NACL 20-0.45 MEQ/L-% IV SOLN
INTRAVENOUS | Status: DC
  Filled 2022-02-15 (×2): qty 1000

## 2022-02-15 SURGICAL SUPPLY — 70 items
BAG COUNTER SPONGE SURGICOUNT (BAG) IMPLANT
BAG SPEC THK2 15X12 ZIP CLS (MISCELLANEOUS) ×1
BAG SPNG CNTER NS LX DISP (BAG)
BAG ZIPLOCK 12X15 (MISCELLANEOUS) ×1 IMPLANT
BANDAGE ESMARK 6X9 LF (GAUZE/BANDAGES/DRESSINGS) ×1 IMPLANT
BEARING TIBIAL OXFORD MED 4 (Orthopedic Implant) IMPLANT
BLADE SURG 15 STRL LF DISP TIS (BLADE) ×1 IMPLANT
BLADE SURG 15 STRL SS (BLADE) ×1
BNDG CMPR 9X6 STRL LF SNTH (GAUZE/BANDAGES/DRESSINGS) ×1
BNDG CMPR MED 15X6 ELC VLCR LF (GAUZE/BANDAGES/DRESSINGS) ×1
BNDG ELASTIC 6X15 VLCR STRL LF (GAUZE/BANDAGES/DRESSINGS) ×1 IMPLANT
BNDG ESMARK 6X9 LF (GAUZE/BANDAGES/DRESSINGS) ×1
BOWL SMART MIX CTS (DISPOSABLE) ×1 IMPLANT
BRNG TIB B UNCMP STRL LM/RL (Joint) ×1 IMPLANT
BRNG TIB MED 4 PHS 3 LT MEN (Orthopedic Implant) ×1 IMPLANT
CEMENT BONE R 1X40 (Cement) IMPLANT
CLSR STERI-STRIP ANTIMIC 1/2X4 (GAUZE/BANDAGES/DRESSINGS) ×1 IMPLANT
COVER SURGICAL LIGHT HANDLE (MISCELLANEOUS) ×1 IMPLANT
CUFF TOURN SGL QUICK 34 (TOURNIQUET CUFF) ×1
CUFF TRNQT CYL 34X4.125X (TOURNIQUET CUFF) ×1 IMPLANT
DRAPE EXTREMITY T 121X128X90 (DISPOSABLE) ×1 IMPLANT
DRAPE POUCH INSTRU U-SHP 10X18 (DRAPES) ×1 IMPLANT
DRAPE SHEET LG 3/4 BI-LAMINATE (DRAPES) ×1 IMPLANT
DRAPE U-SHAPE 47X51 STRL (DRAPES) ×1 IMPLANT
DRSG MEPILEX POST OP 4X8 (GAUZE/BANDAGES/DRESSINGS) ×1 IMPLANT
DURAPREP 26ML APPLICATOR (WOUND CARE) ×2 IMPLANT
ELECT REM PT RETURN 15FT ADLT (MISCELLANEOUS) ×1 IMPLANT
FACESHIELD WRAPAROUND (MASK) ×1 IMPLANT
FACESHIELD WRAPAROUND OR TEAM (MASK) ×1 IMPLANT
GAUZE PAD ABD 8X10 STRL (GAUZE/BANDAGES/DRESSINGS) ×1 IMPLANT
GLOVE BIO SURGEON STRL SZ 6.5 (GLOVE) ×1 IMPLANT
GLOVE BIOGEL PI IND STRL 7.0 (GLOVE) ×1 IMPLANT
GLOVE BIOGEL PI IND STRL 8 (GLOVE) ×1 IMPLANT
GLOVE SURG POLYISO LF SZ7.5 (GLOVE) ×1 IMPLANT
GOWN STRL REUS W/ TWL LRG LVL3 (GOWN DISPOSABLE) ×2 IMPLANT
GOWN STRL REUS W/TWL LRG LVL3 (GOWN DISPOSABLE) ×2
HANDPIECE INTERPULSE COAX TIP (DISPOSABLE) ×1
HDLS TROCR DRIL PIN KNEE 75 (PIN) ×1
HOLDER FOLEY CATH W/STRAP (MISCELLANEOUS) IMPLANT
HOOD PEEL AWAY T7 (MISCELLANEOUS) ×2 IMPLANT
IMMOBILIZER KNEE 20 (SOFTGOODS) ×1
IMMOBILIZER KNEE 20 THIGH 36 (SOFTGOODS) IMPLANT
IMMOBILIZER KNEE 22 UNIV (SOFTGOODS) IMPLANT
INSERT TIBIAL OXFORD SZ B LF (Joint) IMPLANT
KIT BASIN OR (CUSTOM PROCEDURE TRAY) ×1 IMPLANT
KIT TURNOVER KIT A (KITS) IMPLANT
NDL SAFETY ECLIP 18X1.5 (MISCELLANEOUS) ×1 IMPLANT
NS IRRIG 1000ML POUR BTL (IV SOLUTION) ×1 IMPLANT
PACK BLADE SAW RECIP 70 3 PT (BLADE) IMPLANT
PACK TOTAL JOINT (CUSTOM PROCEDURE TRAY) ×1 IMPLANT
PEG TWIN FEM CEMENTED MED (Knees) IMPLANT
PIN DRILL HDLS TROCAR 75 4PK (PIN) IMPLANT
PROTECTOR NERVE ULNAR (MISCELLANEOUS) ×1 IMPLANT
SET HNDPC FAN SPRY TIP SCT (DISPOSABLE) ×1 IMPLANT
SPIKE FLUID TRANSFER (MISCELLANEOUS) IMPLANT
SUCTION FRAZIER HANDLE 12FR (TUBING) ×1
SUCTION TUBE FRAZIER 12FR DISP (TUBING) ×1 IMPLANT
SUT VIC AB 1 CT1 36 (SUTURE) ×1 IMPLANT
SUT VIC AB 2-0 CT1 27 (SUTURE) ×1
SUT VIC AB 2-0 CT1 TAPERPNT 27 (SUTURE) ×1 IMPLANT
SUT VIC AB 3-0 SH 27 (SUTURE) ×2
SUT VIC AB 3-0 SH 27X BRD (SUTURE) ×2 IMPLANT
SYR 30ML LL (SYRINGE) ×1 IMPLANT
SYR 3ML LL SCALE MARK (SYRINGE) ×1 IMPLANT
TOWEL OR 17X26 10 PK STRL BLUE (TOWEL DISPOSABLE) ×1 IMPLANT
TOWEL OR NON WOVEN STRL DISP B (DISPOSABLE) ×1 IMPLANT
TRAY FOLEY MTR SLVR 16FR STAT (SET/KITS/TRAYS/PACK) ×1 IMPLANT
TUBE SUCTION HIGH CAP CLEAR NV (SUCTIONS) ×1 IMPLANT
WATER STERILE IRR 1000ML POUR (IV SOLUTION) ×1 IMPLANT
WRAP KNEE MAXI GEL POST OP (GAUZE/BANDAGES/DRESSINGS) ×1 IMPLANT

## 2022-02-15 NOTE — OR Nursing (Signed)
Attempted foley placement and unable to pass foley catheter. Aborted attempt  PA present and aware.

## 2022-02-15 NOTE — Progress Notes (Signed)
Pre block vitals  02/15/22 1314  Vitals  Pulse Rate 77  Pulse Rate Source Monitor  ECG Heart Rate 77  EKG Rhythm Sinus rhythm  Resp 19  BP (!) 156/95  SpO2 99 %  Oxygen Therapy  O2 Device Room Air  End [place] CO2 (EtCO2) 34  LOC  Level of Consciousness Alert  Orientation Level Oriented X4  Aldrete  Activity 2  Respiration 2  Oxygen Saturation 2  Circulation 2  Consciousness 2  Aldrete Score 10

## 2022-02-15 NOTE — Anesthesia Procedure Notes (Signed)
Procedure Name: LMA Insertion Date/Time: 02/15/2022 2:09 PM  Performed by: Lollie Sails, CRNAPre-anesthesia Checklist: Patient identified, Emergency Drugs available, Suction available, Patient being monitored and Timeout performed Patient Re-evaluated:Patient Re-evaluated prior to induction Oxygen Delivery Method: Circle system utilized Preoxygenation: Pre-oxygenation with 100% oxygen Induction Type: IV induction Ventilation: Mask ventilation without difficulty LMA: LMA inserted LMA Size: 4.0 Number of attempts: 1 Placement Confirmation: positive ETCO2 and breath sounds checked- equal and bilateral Tube secured with: Tape Dental Injury: Teeth and Oropharynx as per pre-operative assessment

## 2022-02-15 NOTE — Op Note (Signed)
02/15/2022  3:53 PM  PATIENT:  John [name]    PRE-OPERATIVE DIAGNOSIS: Left knee osteoarthritis  POST-OPERATIVE DIAGNOSIS:  Same  PROCEDURE:  Unicompartmental Knee Arthroplasty  SURGEON:  Johnny Bridge, MD  PHYSICIAN ASSISTANT: Merlene Pulling, PA-C, present and scrubbed throughout the case, critical for completion in a timely fashion, and for retraction, instrumentation, and closure.  ANESTHESIA:   General  ESTIMATED BLOOD LOSS: 100 mL  UNIQUE ASPECTS OF THE CASE: The patellofemoral joint and lateral compartment were pristine.  The medial compartment did not have as much degenerative changes as I expected.  He did have a complex medial meniscus tear.  He has had a previous MRI that indicated grade 4 chondral loss with bone edema in the medial compartment, although macroscopically that looked like an overcall.    PREOPERATIVE INDICATIONS:  John [name] is a  71 y.o. male with a diagnosis of djd left knee who failed conservative measures and elected for surgical management.    The risks benefits and alternatives were discussed with the patient preoperatively including but not limited to the risks of infection, bleeding, nerve injury, cardiopulmonary complications, blood clots, the need for revision surgery, among others, and the patient was willing to proceed.  OPERATIVE IMPLANTS: Biomet Oxford mobile bearing medial compartment arthroplasty femur size medium, tibia size B, bearing size 4.  OPERATIVE FINDINGS: The medial compartment did not have as much degenerative changes as I expected.  No significant changes in the lateral or patellofemoral joint.  The ACL was intact.  OPERATIVE PROCEDURE: The patient was brought to the operating room placed in the supine position. Anesthesia was administered. IV antibiotics were given. The lower extremity was placed in the legholder and prepped and draped in usual sterile fashion.  Time out was performed.  The leg was elevated and exsanguinated  and the tourniquet was inflated. Anteromedial incision was performed, and I took care to preserve the MCL. Parapatellar incision was carried out, and the osteophytes were excised, along with the medial meniscus and a small portion of the fat pad.  The extra medullary tibial cutting jig was applied, using the spoon and the 55m G-Clamp and the 2 mm shim, and I took care to protect the anterior cruciate ligament insertion and the tibial spine. The medial collateral ligament was also protected, and I resected my proximal tibia, matching the anatomic slope.   The proximal tibial bony cut was removed in one piece, and I turned my attention to the femur.  The intramedullary femoral rod was placed using the drill, and then using the appropriate reference, I assembled the femoral jig, setting my posterior cutting block. I resected my posterior femur, used the 0 spigot for the anterior femur, and then measured my gap.   I then used the appropriate mill to match the extension gap to the flexion gap. The second milling was at a 4 and then a 5.  The gaps were then measured again with the appropriate feeler gauges. Once I had balanced flexion and extension gaps, I then completed the preparation of the femur.  I milled off the anterior aspect of the distal femur to prevent impingement. I also exposed the tibia, and selected the above-named component, and then used the cutting jig to prepare the keel slot on the tibia. I also used the awl to curette out the bone to complete the preparation of the keel. The back wall was intact.  I then placed trial components, and it was found to have excellent motion,  and appropriate balance.  I then cemented the components into place, cementing the tibia first, removing all excess cement, and then cementing the femur.  All loose cement was removed.  The real polyethylene insert was applied manually, and the knee was taken through functional range of motion, and found to have  excellent stability and restoration of joint motion, with excellent balance.  The wounds were irrigated copiously, and the parapatellar tissue closed with Vicryl, followed by Vicryl for the subcutaneous tissue, with routine closure with Steri-Strips and sterile gauze.  The tourniquet was released, and the patient was awakened and extubated and returned to PACU in stable and satisfactory condition. There were no complications.

## 2022-02-15 NOTE — Transfer of Care (Signed)
Immediate Anesthesia Transfer of Care Note  Patient: John [name]  Procedure(s) Performed: UNICOMPARTMENTAL KNEE (Left: Knee)  Patient Location: PACU  Anesthesia Type:General and GA combined with regional for post-op pain  Level of Consciousness: awake, alert , oriented, and patient cooperative  Airway & Oxygen Therapy: Patient Spontanous Breathing and Patient connected to face mask oxygen  Post-op Assessment: Report given to RN and Post -op Vital signs reviewed and stable  Post vital signs: Reviewed and stable  Last Vitals:  Vitals Value Taken Time  BP 111/72 02/15/22 1615  Temp    Pulse 77 02/15/22 1617  Resp 15 02/15/22 1617  SpO2 95 % 02/15/22 1617  Vitals shown include unvalidated device data.  Last Pain:  Vitals:   02/15/22 1158  TempSrc:   PainSc: 0-No pain         Complications: No notable events documented.

## 2022-02-15 NOTE — Discharge Instructions (Signed)

## 2022-02-15 NOTE — Anesthesia Procedure Notes (Signed)
Anesthesia Regional Block: Adductor canal block   Pre-Anesthetic Checklist: , timeout performed,  Correct Patient, Correct Site, Correct Laterality,  Correct Procedure, Correct Position, site marked,  Risks and benefits discussed,  Surgical consent,  Pre-op evaluation,  At surgeon's request and post-op pain management  Laterality: Lower and Left  Prep: chloraprep       Needles:  Injection technique: Single-shot  Needle Type: Stimiplex     Needle Length: 9cm  Needle Gauge: 21     Additional Needles:   Procedures:,,,, ultrasound used (permanent image in chart),,    Narrative:  Start time: 02/15/2022 1:03 PM End time: 02/15/2022 1:23 PM Injection made incrementally with aspirations every 5 mL.  Performed by: Personally  Anesthesiologist: Nolon Nations, MD  Additional Notes: BP cuff, EKG monitors applied. Sedation begun. Artery and nerve location verified with ultrasound. Anesthetic injected incrementally (84m), slowly, and after negative aspirations under direct u/s guidance. Good fascial/perineural spread. Tolerated well.

## 2022-02-15 NOTE — Anesthesia Postprocedure Evaluation (Signed)
Anesthesia Post Note  Patient: John [name]  Procedure(s) Performed: UNICOMPARTMENTAL KNEE (Left: Knee)     Patient location during evaluation: PACU Anesthesia Type: General Level of consciousness: awake Pain management: pain level controlled Vital Signs Assessment: post-procedure vital signs reviewed and stable Respiratory status: spontaneous breathing, nonlabored ventilation and respiratory function stable Cardiovascular status: blood pressure returned to baseline and stable Postop Assessment: no apparent nausea or vomiting Anesthetic complications: no   No notable events documented.  Last Vitals:  Vitals:   02/15/22 1700 02/15/22 1715  BP: 114/80 112/76  Pulse: 74 73  Resp: 13 18  Temp:  36.4 C  SpO2: 97% 96%    Last Pain:  Vitals:   02/15/22 1715  TempSrc:   PainSc: 0-No pain                 Nilda Simmer

## 2022-02-16 DIAGNOSIS — E119 Type 2 diabetes mellitus without complications: Secondary | ICD-10-CM | POA: Diagnosis not present

## 2022-02-16 DIAGNOSIS — Z87891 Personal history of nicotine dependence: Secondary | ICD-10-CM | POA: Diagnosis not present

## 2022-02-16 DIAGNOSIS — Z7982 Long term (current) use of aspirin: Secondary | ICD-10-CM | POA: Diagnosis not present

## 2022-02-16 DIAGNOSIS — Z794 Long term (current) use of insulin: Secondary | ICD-10-CM | POA: Diagnosis not present

## 2022-02-16 DIAGNOSIS — I10 Essential (primary) hypertension: Secondary | ICD-10-CM | POA: Diagnosis not present

## 2022-02-16 DIAGNOSIS — Z79899 Other long term (current) drug therapy: Secondary | ICD-10-CM | POA: Diagnosis not present

## 2022-02-16 DIAGNOSIS — Z8546 Personal history of malignant neoplasm of prostate: Secondary | ICD-10-CM | POA: Diagnosis not present

## 2022-02-16 DIAGNOSIS — M1712 Unilateral primary osteoarthritis, left knee: Secondary | ICD-10-CM | POA: Diagnosis not present

## 2022-02-16 DIAGNOSIS — Z943 Heart and lungs transplant status: Secondary | ICD-10-CM | POA: Diagnosis not present

## 2022-02-16 LAB — CBC
HCT: 50.4 % (ref 39.0–52.0)
Hemoglobin: 17 g/dL (ref 13.0–17.0)
MCH: 30.4 pg (ref 26.0–34.0)
MCHC: 33.7 g/dL (ref 30.0–36.0)
MCV: 90 fL (ref 80.0–100.0)
Platelets: 152 10*3/uL (ref 150–400)
RBC: 5.6 MIL/uL (ref 4.22–5.81)
RDW: 13 % (ref 11.5–15.5)
WBC: 8.5 10*3/uL (ref 4.0–10.5)
nRBC: 0 % (ref 0.0–0.2)

## 2022-02-16 LAB — GLUCOSE, CAPILLARY
Glucose-Capillary: 170 mg/dL — ABNORMAL HIGH (ref 70–99)
Glucose-Capillary: 154 mg/dL — ABNORMAL HIGH (ref 70–99)
Glucose-Capillary: 167 mg/dL — ABNORMAL HIGH (ref 70–99)
Glucose-Capillary: 189 mg/dL — ABNORMAL HIGH (ref 70–99)
Glucose-Capillary: 152 mg/dL — ABNORMAL HIGH (ref 70–99)

## 2022-02-16 LAB — BASIC METABOLIC PANEL
Anion gap: 4 — ABNORMAL LOW (ref 5–15)
BUN: 21 mg/dL (ref 8–23)
CO2: 25 mmol/L (ref 22–32)
Calcium: 8.2 mg/dL — ABNORMAL LOW (ref 8.9–10.3)
Chloride: 105 mmol/L (ref 98–111)
Creatinine, Ser: 1.33 mg/dL — ABNORMAL HIGH (ref 0.61–1.24)
GFR, Estimated: 58 mL/min — ABNORMAL LOW (ref >60)
Glucose, Bld: 165 mg/dL — ABNORMAL HIGH (ref 70–99)
Potassium: 5 mmol/L (ref 3.5–5.1)
Sodium: 134 mmol/L — ABNORMAL LOW (ref 135–145)

## 2022-02-16 MED ORDER — RIVAROXABAN 10 MG PO TABS: 10.0000 mg | ORAL_TABLET | Freq: Every day | ORAL | 0 refills | Status: DC

## 2022-02-16 MED ORDER — OXYCODONE HCL 5 MG PO TABS: 5.0000 mg | ORAL_TABLET | ORAL | 0 refills | Status: DC | PRN

## 2022-02-16 MED ORDER — ASPIRIN 325 MG PO TBEC: 325.0000 mg | DELAYED_RELEASE_TABLET | Freq: Two times a day (BID) | ORAL | 0 refills | Status: DC

## 2022-02-16 MED ORDER — ONDANSETRON HCL 4 MG PO TABS: 4.0000 mg | ORAL_TABLET | Freq: Three times a day (TID) | ORAL | 0 refills | Status: DC | PRN

## 2022-02-16 MED ORDER — SENNA-DOCUSATE SODIUM 8.6-50 MG PO TABS: 2.0000 | ORAL_TABLET | Freq: Every day | ORAL | 1 refills | Status: DC

## 2022-02-16 NOTE — Progress Notes (Signed)
Physical Therapy Treatment Patient Details Name: John [name] MRN: [id number] DOB: [birth year] Today's Date: 02/16/2022   History of Present Illness Pt is a 71 year old male s/p left unicompartmental knee arthroplasty    PT Comments    Pt eager to mobilize and assisted out of recliner however pt's medial Lt LE remains numb ,and pt had buckling so returned to recliner.  Pt assisted with donning KI and spouse present and educated as well.  Pt with improved stability with KI in place.  Pt ambulated and practiced safe stair technique.  Pt educated to keep knee in extension at rest and wear KI when standing and mobilizing for safety.  Pt weary about d/c home today with LE still numb so RN informed.  Pt provided with HEP and stair handouts if he does d/c home later today.    Recommendations for follow up therapy are one component of a multi-disciplinary discharge planning process, led by the attending physician.  Recommendations may be updated based on patient status, additional functional criteria and insurance authorization.  Follow Up Recommendations  Follow physician's recommendations for discharge plan and follow up therapies     Assistance Recommended at Discharge PRN  Patient can return home with the following Help with stairs or ramp for entrance   Equipment Recommendations  None recommended by PT    Recommendations for Other Services       Precautions / Restrictions Precautions Precautions: Knee;Fall Required Braces or Orthoses: Knee Immobilizer - Left Knee Immobilizer - Left: On when out of bed or walking Restrictions LLE Weight Bearing: Weight bearing as tolerated     Mobility  Bed Mobility Overal bed mobility: Needs Assistance Bed Mobility: Supine to Sit     Supine to sit: Supervision, HOB elevated     General bed mobility comments: pt in recliner    Transfers Overall transfer level: Needs assistance Equipment used: Rolling walker (2 wheels) Transfers: Sit  to/from Stand Sit to Stand: Min assist, Min guard           General transfer comment: verbal cues for UE and LE positioning    Ambulation/Gait Ambulation/Gait assistance: Min assist, Min guard Gait Distance (Feet): 120 Feet Assistive device: Rolling walker (2 wheels) Gait Pattern/deviations: Step-to pattern, Decreased stance time - left, Antalgic       General Gait Details: verbal cues for sequence, RW positioning, posture, step length   Stairs Stairs: Yes Stairs assistance: Min guard Stair Management: Step to pattern, Sideways, One rail Left Number of Stairs: 2 General stair comments: verbal cues for safety and sequence; pt performed twice; spouse observed; provided handout   Wheelchair Mobility    Modified Rankin (Stroke Patients Only)       Balance                                            Cognition Arousal/Alertness: Awake/alert Behavior During Therapy: WFL for tasks assessed/performed Overall Cognitive Status: Within Functional Limits for tasks assessed                                          Exercises     General Comments        Pertinent Vitals/Pain Pain Assessment Pain Assessment: 0-10 Pain Score: 5  Pain Location: left knee Pain  Descriptors / Indicators: Sore, Aching Pain Intervention(s): Repositioned, Monitored during session    Home Living Family/patient expects to be discharged to:: Private residence Living Arrangements: Spouse/significant other   Type of Home: House       Alternate Level Stairs-Number of Steps: flight Home Layout: Two level Home Equipment: Conservation officer, nature (2 wheels)      Prior Function            PT Goals (current goals can now be found in the care plan section) Acute Rehab PT Goals PT Goal Formulation: With patient Time For Goal Achievement: 02/23/22 Potential to Achieve Goals: Good Progress towards PT goals: Progressing toward goals    Frequency     7X/week      PT Plan Current plan remains appropriate    Co-evaluation              AM-PAC PT "6 Clicks" Mobility   Outcome Measure  Help needed turning from your back to your side while in a flat bed without using bedrails?: A Little Help needed moving from lying on your back to sitting on the side of a flat bed without using bedrails?: A Little Help needed moving to and from a bed to a chair (including a wheelchair)?: A Little Help needed standing up from a chair using your arms (e.g., wheelchair or bedside chair)?: A Little Help needed to walk in hospital room?: A Little Help needed climbing 3-5 steps with a railing? : A Little 6 Click Score: 18    End of Session Equipment Utilized During Treatment: Gait belt;Left knee immobilizer Activity Tolerance: Patient tolerated treatment well Patient left: in chair;with call bell/phone within reach;with family/visitor present;with nursing/sitter in room Nurse Communication: Mobility status PT Visit Diagnosis: Other abnormalities of gait and mobility (R26.89)     Time: 1345-1410 PT Time Calculation (min) (ACUTE ONLY): 25 min  Charges:  $Gait Training: 23-37 mins                    Jannette Spanner PT, DPT Physical Therapist Acute Rehabilitation Services Preferred contact method: Secure Chat Weekend Pager Only: [phone] Office: [phone]    [name] 02/16/2022, 3:02 PM

## 2022-02-16 NOTE — Evaluation (Signed)
Physical Therapy Evaluation Patient Details Name: John [name] MRN: [id number] DOB: 05/06/1950 Today's Date: 02/16/2022  History of Present Illness  Pt is a 71 year old male s/p left unicompartmental knee arthroplasty  Clinical Impression  Pt is s/p L UKA resulting in the deficits listed below (see PT Problem List).  Pt will benefit from skilled PT to increase their independence and safety with mobility to allow discharge to the venue listed below.  Pt assisted with ambulating in hallway and performed LE exercises.  Pt's medial leg remains numb and pt unable to perform SLR so utilized KI for safety.  Will return to practice steps this afternoon.        Recommendations for follow up therapy are one component of a multi-disciplinary discharge planning process, led by the attending physician.  Recommendations may be updated based on patient status, additional functional criteria and insurance authorization.  Follow Up Recommendations Follow physician's recommendations for discharge plan and follow up therapies      Assistance Recommended at Discharge PRN  Patient can return home with the following  Help with stairs or ramp for entrance    Equipment Recommendations None recommended by PT  Recommendations for Other Services       Functional Status Assessment       Precautions / Restrictions Precautions Precautions: Knee;Fall Required Braces or Orthoses: Knee Immobilizer - Left Knee Immobilizer - Left: On when out of bed or walking Restrictions LLE Weight Bearing: Weight bearing as tolerated      Mobility  Bed Mobility Overal bed mobility: Needs Assistance Bed Mobility: Supine to Sit     Supine to sit: Supervision, HOB elevated     General bed mobility comments: donned KI as pt unable to perform SLR and medial leg remains numb    Transfers Overall transfer level: Needs assistance Equipment used: Rolling walker (2 wheels) Transfers: Sit to/from Stand Sit to Stand: Min  guard           General transfer comment: verbal cues for UE and LE positioning    Ambulation/Gait Ambulation/Gait assistance: Min guard Gait Distance (Feet): 120 Feet Assistive device: Rolling walker (2 wheels) Gait Pattern/deviations: Step-to pattern, Decreased stance time - left, Antalgic       General Gait Details: verbal cues for sequence, RW positioning, posture, step length  Stairs            Wheelchair Mobility    Modified Rankin (Stroke Patients Only)       Balance                                             Pertinent Vitals/Pain Pain Assessment Pain Assessment: 0-10 Pain Score: 4  Pain Location: left knee Pain Descriptors / Indicators: Sore, Aching Pain Intervention(s): Monitored during session, Repositioned    Home Living Family/patient expects to be discharged to:: Private residence Living Arrangements: Spouse/significant other   Type of Home: House       Alternate Level Stairs-Number of Steps: flight Home Layout: Two level Home Equipment: Conservation officer, nature (2 wheels)      Prior Function Prior Level of Function : Independent/Modified Independent                     Hand Dominance        Extremity/Trunk Assessment        Lower Extremity Assessment Lower Extremity  Assessment: LLE deficits/detail LLE Deficits / Details: medial leg still numb, pt unable to perform SLR, lacking approx 10* extension, able to perform 100* AAROM knee flexion       Communication   Communication: No difficulties  Cognition Arousal/Alertness: Awake/alert Behavior During Therapy: WFL for tasks assessed/performed Overall Cognitive Status: Within Functional Limits for tasks assessed                                          General Comments      Exercises Total Joint Exercises Ankle Circles/Pumps: AROM, Both, 10 reps Quad Sets: AROM, Left, 10 reps Heel Slides: AAROM, 10 reps, Left Hip ABduction/ADduction:  AAROM, Left, 10 reps Straight Leg Raises: AAROM, Left, 10 reps   Assessment/Plan    PT Assessment Patient needs continued PT services  PT Problem List Decreased strength;Decreased activity tolerance;Decreased range of motion;Decreased knowledge of use of DME;Decreased mobility;Pain;Decreased knowledge of precautions       PT Treatment Interventions Stair training;Gait training;DME instruction;Therapeutic exercise;Therapeutic activities;Patient/family education;Functional mobility training    PT Goals (Current goals can be found in the Care Plan section)  Acute Rehab PT Goals PT Goal Formulation: With patient Time For Goal Achievement: 02/23/22 Potential to Achieve Goals: Good    Frequency 7X/week     Co-evaluation               AM-PAC PT "6 Clicks" Mobility  Outcome Measure Help needed turning from your back to your side while in a flat bed without using bedrails?: A Little Help needed moving from lying on your back to sitting on the side of a flat bed without using bedrails?: A Little Help needed moving to and from a bed to a chair (including a wheelchair)?: A Little Help needed standing up from a chair using your arms (e.g., wheelchair or bedside chair)?: A Little Help needed to walk in hospital room?: A Little Help needed climbing 3-5 steps with a railing? : A Little 6 Click Score: 18    End of Session Equipment Utilized During Treatment: Gait belt;Left knee immobilizer Activity Tolerance: Patient tolerated treatment well Patient left: in chair;with call bell/phone within reach Nurse Communication: Mobility status PT Visit Diagnosis: Other abnormalities of gait and mobility (R26.89)    Time: 6979-4801 PT Time Calculation (min) (ACUTE ONLY): 25 min   Charges:   PT Evaluation $PT Eval Low Complexity: 1 Low PT Treatments $Therapeutic Exercise: 8-22 mins       Jannette Spanner PT, DPT Physical Therapist Acute Rehabilitation Services Preferred contact method: Secure  Chat Weekend Pager Only: [phone] Office: [phone]   John [name] 02/16/2022, 2:58 PM

## 2022-02-16 NOTE — Progress Notes (Signed)
Subjective: 1 Day Post-Op s/p Procedure(s): UNICOMPARTMENTAL KNEE   Patient is alert, oriented. Report minimal pain overnight, increased pain with movement.   Denies chest pain, SOB, Calf pain. No nausea/vomiting. No other complaints.  Objective:  PE: VITALS:   Vitals:   02/15/22 2003 02/15/22 2204 02/16/22 0127 02/16/22 0617  BP: 117/83 115/76 104/64 113/76  Pulse: 73 72 69 68  Resp: '16 17 17 17  '$ Temp: 98.1 F (36.7 C) 97.8 F (36.6 C) 97.9 F (36.6 C) 97.9 F (36.6 C)  TempSrc: Oral Oral Oral Oral  SpO2: 96% 94% 96% 98%  Weight:      Height:       ABD soft Sensation intact distally Intact pulses distally Dorsiflexion/Plantar flexion intact Incision: moderate drainage  LABS  Results for orders placed or performed during the hospital encounter of 02/15/22 (from the past 24 hour(s))  Glucose, capillary     Status: None   Collection Time: 02/15/22 11:48 AM  Result Value Ref Range   Glucose-Capillary 91 70 - 99 mg/dL  Glucose, capillary     Status: Abnormal   Collection Time: 02/15/22  2:34 PM  Result Value Ref Range   Glucose-Capillary 120 (H) 70 - 99 mg/dL  Glucose, capillary     Status: Abnormal   Collection Time: 02/15/22  4:19 PM  Result Value Ref Range   Glucose-Capillary 117 (H) 70 - 99 mg/dL  Glucose, capillary     Status: Abnormal   Collection Time: 02/15/22 10:07 PM  Result Value Ref Range   Glucose-Capillary 212 (H) 70 - 99 mg/dL  CBC     Status: None   Collection Time: 02/16/22  2:47 AM  Result Value Ref Range   WBC 8.5 4.0 - 10.5 K/uL   RBC 5.60 4.22 - 5.81 MIL/uL   Hemoglobin 17.0 13.0 - 17.0 g/dL   HCT 50.4 39.0 - 52.0 %   MCV 90.0 80.0 - 100.0 fL   MCH 30.4 26.0 - 34.0 pg   MCHC 33.7 30.0 - 36.0 g/dL   RDW 13.0 11.5 - 15.5 %   Platelets 152 150 - 400 K/uL   nRBC 0.0 0.0 - 0.2 %  Basic metabolic panel     Status: Abnormal   Collection Time: 02/16/22  2:47 AM  Result Value Ref Range   Sodium 134 (L) 135 - 145 mmol/L   Potassium 5.0  3.5 - 5.1 mmol/L   Chloride 105 98 - 111 mmol/L   CO2 25 22 - 32 mmol/L   Glucose, Bld 165 (H) 70 - 99 mg/dL   BUN 21 8 - 23 mg/dL   Creatinine, Ser 1.33 (H) 0.61 - 1.24 mg/dL   Calcium 8.2 (L) 8.9 - 10.3 mg/dL   GFR, Estimated 58 (L) >60 mL/min   Anion gap 4 (L) 5 - 15  Glucose, capillary     Status: Abnormal   Collection Time: 02/16/22  3:20 AM  Result Value Ref Range   Glucose-Capillary 170 (H) 70 - 99 mg/dL  Glucose, capillary     Status: Abnormal   Collection Time: 02/16/22  7:25 AM  Result Value Ref Range   Glucose-Capillary 154 (H) 70 - 99 mg/dL    DG Knee Left Port  Result Date: 02/15/2022 CLINICAL DATA:  [id number] S/P left unicompartmental knee replacement [id number] EXAM: PORTABLE LEFT KNEE - 1-2 VIEW COMPARISON:  None Available. FINDINGS: Postsurgical changes of medial compartment arthroplasty. Hardware is intact without evidence of loosening or periprosthetic fracture. Expected soft tissue changes  including a joint effusion. There is mild patellofemoral osteoarthritis. IMPRESSION: Postsurgical changes of medial compartment arthroplasty. No evidence of immediate complication. Electronically Signed   By: Maurine Simmering M.D.   On: 02/15/2022 16:54    Assessment/Plan: Principal Problem:   S/P left unicompartmental knee replacement  1 Day Post-Op s/p Procedure(s): UNICOMPARTMENTAL KNEE  Weightbearing: WBAT LLE, up with therapy Insicional and dressing care: Reinforce dressings as needed, changed to new aquacell this morning due to some distal incision bleeding. Please let me know if any drainage seen after working with PT today.  VTE prophylaxis: Aspirin '325mg'$  BID  x 30 days, patient has already bought will not send as d/c med Pain control: continue current regimen Follow - up plan: 2 weeks with Dr. Mardelle Matte Dispo: pending PT eval, likely discharge home later this afternoon if able to pass PT Contact information:   Merlene Pulling, PA-C Weekdays 8-5  After hours and holidays please  check [email or web address] for group call information for [facility]  Ventura Bruns 02/16/2022, 8:48 AM

## 2022-02-16 NOTE — Progress Notes (Signed)
Patient seen and examined.  He reports numbness along the medial aspect of the knee, sensation and motor intact at the ankle.  He is unable to do a straight leg raise.  He has a slight amount of drainage distal at the incision, contained within the dressing.  Impression: Right partial knee replacement  Plan: Reassurance offered he is going to work with physical therapy, continue regaining strength and function of his quadriceps, knee immobilizer as needed, likely discharge tomorrow.  Dressing change in the morning.  All questions been answered.

## 2022-02-16 NOTE — Inpatient Diabetes Management (Signed)
Inpatient Diabetes Program Recommendations  AACE/ADA: New Consensus Statement on Inpatient Glycemic Control (2015)  Target Ranges:  Prepandial:   less than 140 mg/dL      Peak postprandial:   less than 180 mg/dL (1-2 hours)      Critically ill patients:  140 - 180 mg/dL   Lab Results  Component Value Date   GLUCAP 167 (H) 02/16/2022   HGBA1C 8.1 (H) 05/07/2021    Review of Glycemic Control  Latest Reference Range & Units 02/16/22 03:20 02/16/22 07:25 02/16/22 12:40  Glucose-Capillary 70 - 99 mg/dL 170 (H) 154 (H) 167 (H)  (H): Data is abnormally high Diabetes history: Type 2 DM Outpatient Diabetes medications: omnipod Current orders for Inpatient glycemic control: Omnipod- insulin pump  Inpatient Diabetes Program Recommendations:    Spoke with patient regarding diabetes and home regimen for diabetes management.  Trending well. Patient savvy and adjusted basal rates for recovery.   Current insulin pump settings are as follows:  Basal insulin  0000 2.0 units/hour 0600 1.6 units/hour 1000 1.45 units/hour 1500 1.25 units/hour 1800 1.85 units/hour Total daily basal insulin: 40.5 units/24 hours  Carb Coverage 1:7 1 unit for every 7 grams of carbohydrates  Insulin Sensitivity 1:35 1 unit drops blood glucose 35 mg/dl  Target Glucose Goals 0000-0000 100-120 mg/dl   Patient verbalized understanding of information discussed and states that he does not have any further questions related to diabetes at this time.  NURSING: Once insulin pump order set is ordered please print off the Patient insulin pump contract and flow sheet. The insulin pump contract should be signed by the patient and then placed in the chart. The patient insulin pump flow sheet will be completed by the patient at the bedside and the RN caring for the patient will use the patient's flow sheet to document in the [facility]. RN will need to complete the Nursing Insulin Pump Flowsheet at least once a shift. Patient will  need to keep extra insulin pump supplies at the bedside at all times.    Thanks, Bronson Curb, MSN, RNC-OB Diabetes Coordinator [phone] (8a-5p)

## 2022-02-16 NOTE — Plan of Care (Signed)

## 2022-02-16 NOTE — TOC Transition Note (Signed)
Transition of Care [facility]) - CM/SW Discharge Note   Patient Details  Name: John [name] MRN: [id number] Date of Birth: 12-Jun-1950  [facility] CM/SW Contact:  Lennart Pall, LCSW Phone Number: 02/16/2022, 10:16 AM   Clinical Narrative:     Met with pt and confirming he has needed DME at home.  OPPT already arranged with [facility].  No TOC needs.  Final next level of care: OP Rehab Barriers to Discharge: No Barriers Identified   Patient Goals and CMS Choice Patient states their goals for this hospitalization and ongoing recovery are:: return home      Discharge Placement                       Discharge Plan and Services                DME Arranged: N/A DME Agency: NA                  Social Determinants of Health (SDOH) Interventions     Readmission Risk Interventions     No data to display

## 2022-02-16 NOTE — Progress Notes (Signed)
Called by nursing, patient is still having significant numbness at the medial aspect of his right knee, states that he was couldn't feel his knee while working with PT, had to use a knee immobilizer in order to be able to work with PT. He had full sensation to foot this morning when I examined him, though he did have numbness to the medial knee. Dorsiflexion and plantarflexion were intact and he was able to perform a straight leg raise this morning. Pulses were intact and this was confirmed by nursing this afternoon. Will plan to keep overnight to see if we can get some resolution of this numbness to be able to better function with physical therapy without a knee immobilizer.   Merlene Pulling, PA-C

## 2022-02-17 ENCOUNTER — Encounter (HOSPITAL_COMMUNITY): Payer: Self-pay | Admitting: Orthopedic Surgery

## 2022-02-17 DIAGNOSIS — E11319 Type 2 diabetes mellitus with unspecified diabetic retinopathy without macular edema: Secondary | ICD-10-CM | POA: Diagnosis not present

## 2022-02-17 DIAGNOSIS — E1169 Type 2 diabetes mellitus with other specified complication: Secondary | ICD-10-CM | POA: Diagnosis not present

## 2022-02-17 DIAGNOSIS — Z7982 Long term (current) use of aspirin: Secondary | ICD-10-CM | POA: Diagnosis not present

## 2022-02-17 DIAGNOSIS — Z87891 Personal history of nicotine dependence: Secondary | ICD-10-CM | POA: Diagnosis not present

## 2022-02-17 DIAGNOSIS — Z79899 Other long term (current) drug therapy: Secondary | ICD-10-CM | POA: Diagnosis not present

## 2022-02-17 DIAGNOSIS — I10 Essential (primary) hypertension: Secondary | ICD-10-CM | POA: Diagnosis not present

## 2022-02-17 DIAGNOSIS — Z794 Long term (current) use of insulin: Secondary | ICD-10-CM | POA: Diagnosis not present

## 2022-02-17 DIAGNOSIS — E118 Type 2 diabetes mellitus with unspecified complications: Secondary | ICD-10-CM | POA: Diagnosis not present

## 2022-02-17 DIAGNOSIS — E119 Type 2 diabetes mellitus without complications: Secondary | ICD-10-CM | POA: Diagnosis not present

## 2022-02-17 DIAGNOSIS — Z943 Heart and lungs transplant status: Secondary | ICD-10-CM | POA: Diagnosis not present

## 2022-02-17 DIAGNOSIS — Z8546 Personal history of malignant neoplasm of prostate: Secondary | ICD-10-CM | POA: Diagnosis not present

## 2022-02-17 DIAGNOSIS — M1712 Unilateral primary osteoarthritis, left knee: Secondary | ICD-10-CM | POA: Diagnosis not present

## 2022-02-17 LAB — CBC
HCT: 47.2 % (ref 39.0–52.0)
Hemoglobin: 16.3 g/dL (ref 13.0–17.0)
MCH: 30.8 pg (ref 26.0–34.0)
MCHC: 34.5 g/dL (ref 30.0–36.0)
MCV: 89.1 fL (ref 80.0–100.0)
Platelets: 140 10*3/uL — ABNORMAL LOW (ref 150–400)
RBC: 5.3 MIL/uL (ref 4.22–5.81)
RDW: 13.2 % (ref 11.5–15.5)
WBC: 8.7 10*3/uL (ref 4.0–10.5)
nRBC: 0 % (ref 0.0–0.2)

## 2022-02-17 LAB — GLUCOSE, CAPILLARY
Glucose-Capillary: 108 mg/dL — ABNORMAL HIGH (ref 70–99)
Glucose-Capillary: 83 mg/dL (ref 70–99)
Glucose-Capillary: 114 mg/dL — ABNORMAL HIGH (ref 70–99)

## 2022-02-17 MED ORDER — ASPIRIN 325 MG PO TBEC: 325.0000 mg | DELAYED_RELEASE_TABLET | Freq: Two times a day (BID) | ORAL | 0 refills | Status: DC

## 2022-02-17 NOTE — Plan of Care (Signed)

## 2022-02-17 NOTE — Plan of Care (Signed)
  Problem: Education: Goal: Knowledge of the prescribed therapeutic regimen will improve Outcome: Progressing   Problem: Activity: Goal: Range of joint motion will improve Outcome: Progressing   Problem: Pain Management: Goal: Pain level will decrease with appropriate interventions Outcome: Progressing   Problem: Safety: Goal: Ability to remain free from injury will improve Outcome: Progressing   

## 2022-02-17 NOTE — Progress Notes (Signed)
Physical Therapy Treatment Patient Details Name: John [name] MRN: [id number] DOB: 1950/05/27 Today's Date: 02/17/2022   History of Present Illness Pt is a 71 year old male s/p left unicompartmental knee arthroplasty    PT Comments    Pt able to perform SLR today and did not have knee buckling with standing activities.  Pt able to ambulate in hallway and practiced safe stair technique again.  Pt reviewed exercises verbally on HEP handout and had no further questions.  Pt ready for d/c home today.    Recommendations for follow up therapy are one component of a multi-disciplinary discharge planning process, led by the attending physician.  Recommendations may be updated based on patient status, additional functional criteria and insurance authorization.  Follow Up Recommendations  Follow physician's recommendations for discharge plan and follow up therapies     Assistance Recommended at Discharge PRN  Patient can return home with the following Help with stairs or ramp for entrance   Equipment Recommendations  None recommended by PT    Recommendations for Other Services       Precautions / Restrictions Precautions Precautions: Knee;Fall Precaution Comments: pt able to perform SLR Restrictions LLE Weight Bearing: Weight bearing as tolerated     Mobility  Bed Mobility Overal bed mobility: Modified Independent                  Transfers Overall transfer level: Needs assistance Equipment used: Rolling walker (2 wheels) Transfers: Sit to/from Stand Sit to Stand: Min guard, Supervision           General transfer comment: verbal cues for UE and LE positioning    Ambulation/Gait Ambulation/Gait assistance: Min guard, Supervision Gait Distance (Feet): 160 Feet Assistive device: Rolling walker (2 wheels) Gait Pattern/deviations: Step-to pattern, Decreased stance time - left, Antalgic       General Gait Details: verbal cues for sequence, RW positioning,  posture, step length   Stairs Stairs: Yes Stairs assistance: Min guard Stair Management: Step to pattern, Sideways, One rail Left Number of Stairs: 2 General stair comments: verbal cues for safety and sequence; pt performed twice   Wheelchair Mobility    Modified Rankin (Stroke Patients Only)       Balance                                            Cognition Arousal/Alertness: Awake/alert Behavior During Therapy: WFL for tasks assessed/performed Overall Cognitive Status: Within Functional Limits for tasks assessed                                          Exercises      General Comments        Pertinent Vitals/Pain Pain Assessment Pain Assessment: 0-10 Pain Score: 6  Pain Location: left knee Pain Descriptors / Indicators: Sore, Aching Pain Intervention(s): Repositioned, Monitored during session    Home Living                          Prior Function            PT Goals (current goals can now be found in the care plan section) Progress towards PT goals: Progressing toward goals    Frequency    7X/week  PT Plan Current plan remains appropriate    Co-evaluation              AM-PAC PT "6 Clicks" Mobility   Outcome Measure  Help needed turning from your back to your side while in a flat bed without using bedrails?: A Little Help needed moving from lying on your back to sitting on the side of a flat bed without using bedrails?: A Little Help needed moving to and from a bed to a chair (including a wheelchair)?: A Little Help needed standing up from a chair using your arms (e.g., wheelchair or bedside chair)?: A Little Help needed to walk in hospital room?: A Little Help needed climbing 3-5 steps with a railing? : A Little 6 Click Score: 18    End of Session Equipment Utilized During Treatment: Gait belt Activity Tolerance: Patient tolerated treatment well Patient left: in chair;with call  bell/phone within reach Nurse Communication: Mobility status PT Visit Diagnosis: Other abnormalities of gait and mobility (R26.89)     Time: 4696-2952 PT Time Calculation (min) (ACUTE ONLY): 17 min  Charges:  $Gait Training: 8-22 mins                     John [name], DPT Physical Therapist Acute Rehabilitation Services Preferred contact method: Secure Chat Weekend Pager Only: [phone] Office: [phone]    Kati L Payson 02/17/2022, 1:25 PM

## 2022-02-17 NOTE — Progress Notes (Signed)
Subjective: 2 Days Post-Op s/p Procedure(s): UNICOMPARTMENTAL KNEE   Patient is alert, oriented. Continues to have numbness to medial knee. Had a fair amount of distal wound bleeding overnight, it soaked into the knee immobilizer.  Denies chest pain, SOB, Calf pain. No nausea/vomiting. No other complaints.  Objective:  PE: VITALS:   Vitals:   02/16/22 0858 02/16/22 1430 02/16/22 2022 02/17/22 0540  BP: 113/65 109/69 129/68 133/79  Pulse: 74 79 78 77  Resp: '16 18 17 17  '$ Temp: 98 F (36.7 C) 98.4 F (36.9 C) 97.7 F (36.5 C) 97.6 F (36.4 C)  TempSrc: Oral Oral    SpO2: 94% 96% 97% 96%  Weight:      Height:       General: laying in bed, in no acute distress Resp: normal respiratory effort MSK: continues to be unable to feel light touch to medial knee and just distal to knee. Able to perform a straight leg raise, able to move 0-100 degrees this morning. Dorsiflexion and plantarflexion intact. Feeling intact to all aspects of foot.   LABS  Results for orders placed or performed during the hospital encounter of 02/15/22 (from the past 24 hour(s))  Glucose, capillary     Status: Abnormal   Collection Time: 02/16/22 12:40 PM  Result Value Ref Range   Glucose-Capillary 167 (H) 70 - 99 mg/dL  Glucose, capillary     Status: Abnormal   Collection Time: 02/16/22  5:22 PM  Result Value Ref Range   Glucose-Capillary 189 (H) 70 - 99 mg/dL  Glucose, capillary     Status: Abnormal   Collection Time: 02/16/22  8:29 PM  Result Value Ref Range   Glucose-Capillary 152 (H) 70 - 99 mg/dL  Glucose, capillary     Status: Abnormal   Collection Time: 02/17/22  3:13 AM  Result Value Ref Range   Glucose-Capillary 108 (H) 70 - 99 mg/dL  CBC     Status: Abnormal   Collection Time: 02/17/22  3:14 AM  Result Value Ref Range   WBC 8.7 4.0 - 10.5 K/uL   RBC 5.30 4.22 - 5.81 MIL/uL   Hemoglobin 16.3 13.0 - 17.0 g/dL   HCT 47.2 39.0 - 52.0 %   MCV 89.1 80.0 - 100.0 fL   MCH 30.8 26.0 -  34.0 pg   MCHC 34.5 30.0 - 36.0 g/dL   RDW 13.2 11.5 - 15.5 %   Platelets 140 (L) 150 - 400 K/uL   nRBC 0.0 0.0 - 0.2 %  Glucose, capillary     Status: None   Collection Time: 02/17/22  8:17 AM  Result Value Ref Range   Glucose-Capillary 83 70 - 99 mg/dL    DG Knee Left Port  Result Date: 02/15/2022 CLINICAL DATA:  [id number] S/P left unicompartmental knee replacement [id number] EXAM: PORTABLE LEFT KNEE - 1-2 VIEW COMPARISON:  None Available. FINDINGS: Postsurgical changes of medial compartment arthroplasty. Hardware is intact without evidence of loosening or periprosthetic fracture. Expected soft tissue changes including a joint effusion. There is mild patellofemoral osteoarthritis. IMPRESSION: Postsurgical changes of medial compartment arthroplasty. No evidence of immediate complication. Electronically Signed   By: Maurine Simmering M.D.   On: 02/15/2022 16:54    Assessment/Plan: Principal Problem:   S/P left unicompartmental knee replacement  2 Days Post-Op s/p Procedure(s): UNICOMPARTMENTAL KNEE  Weightbearing: WBAT LLE, up with therapy Insicional and dressing care: Reinforce dressings as needed, Patient worried about bleeding overnight. I have held his aspirin today, in hopes to  decrease bleeding. We did not use TXA at time of surgery due to history of heart transplant. I put dermabond on the wound today as well as new mepilex dressing with pressure ace wrap. I have held off from using a bulky dressing to allow him to work with PT today, but this can be changed to a bulky dressing if needed later today.  VTE prophylaxis: Aspirin 325 mg BID, holding today Pain control: continue current regimen Follow - up plan: 2 weeks with Dr. Mardelle Matte Dispo: pending progress with PT today Contact information:   Merlene Pulling, PA-C Weekdays 8-5  After hours and holidays please check [email or web address] for group call information for [facility]  Ventura Bruns 02/17/2022, 9:37 AM

## 2022-02-17 NOTE — Progress Notes (Signed)
Pt is LLE ace wrap removed and aquacel has no drainage.  Merlene Pulling, PA made aware.  Pt is now ready for discharge.  Remains alert and oriented.  AVS given.  Pt able to verbalize understanding of all discharge instructions, including wound care. Pt understands the importance of keeping ace wrap intact until Saturday, per Merlene Pulling, PA. All questions answered at this time.  Pt will discharge once wife arrived to unit to pick him up.

## 2022-02-18 DIAGNOSIS — R262 Difficulty in walking, not elsewhere classified: Secondary | ICD-10-CM | POA: Diagnosis not present

## 2022-02-18 DIAGNOSIS — M1712 Unilateral primary osteoarthritis, left knee: Secondary | ICD-10-CM | POA: Diagnosis not present

## 2022-02-18 DIAGNOSIS — M6281 Muscle weakness (generalized): Secondary | ICD-10-CM | POA: Diagnosis not present

## 2022-02-18 DIAGNOSIS — M25662 Stiffness of left knee, not elsewhere classified: Secondary | ICD-10-CM | POA: Diagnosis not present

## 2022-02-18 NOTE — Discharge Summary (Signed)
Discharge Summary  Patient ID: John [name] MRN: [id number] DOB/AGE: [birth year] 71 y.o.  Admit date: 02/15/2022 Discharge date: 02/17/2022  Admission Diagnoses:  S/P left unicompartmental knee replacement  Discharge Diagnoses:  Principal Problem:   S/P left unicompartmental knee replacement   Past Medical History:  Diagnosis Date   Arthritis    Cancer ([place])    prostate   Complication of anesthesia    Problems with waking up and with intubation   Diabetes mellitus without complication ([place])    GERD (gastroesophageal reflux disease)    Hypertension    Myocardial infarction [facility])    Sleep apnea    cpap    Surgeries: Procedure(s): UNICOMPARTMENTAL KNEE on 02/15/2022   Consultants (if any):   Discharged Condition: Improved  Hospital Course: John [name] is an 71 y.o. male who was admitted 02/15/2022 with a diagnosis of S/P left unicompartmental knee replacement and went to the operating room on 02/15/2022 and underwent the above named procedures.    He was given perioperative antibiotics:  Anti-infectives (From admission, onward)    Start     Dose/Rate Route Frequency Ordered Stop   02/15/22 2000  ceFAZolin (ANCEF) IVPB 2g/100 mL premix        2 g 200 mL/hr over 30 Minutes Intravenous Every 6 hours 02/15/22 1746 02/16/22 1019   02/15/22 1903  ceFAZolin (ANCEF) 2-4 GM/100ML-% IVPB       Note to Pharmacy: Dorise Hiss M: cabinet override      02/15/22 1903 02/16/22 0714   02/15/22 1145  ceFAZolin (ANCEF) IVPB 2g/100 mL premix        2 g 200 mL/hr over 30 Minutes Intravenous On call to O.R. 02/15/22 1139 02/15/22 1425     .  He was given sequential compression devices, early ambulation, and aspirin for DVT prophylaxis.  He benefited maximally from the hospital stay and there were no complications.    Recent vital signs:  Vitals:   02/17/22 0540 02/17/22 1407  BP: 133/79 (!) 140/73  Pulse: 77 79  Resp: 17 18  Temp: 97.6 F (36.4 C) 98.3 F (36.8 C)   SpO2: 96% 95%    Recent laboratory studies:  Lab Results  Component Value Date   HGB 16.3 02/17/2022   HGB 17.0 02/16/2022   HGB 18.8 (H) 02/02/2022   Lab Results  Component Value Date   WBC 8.7 02/17/2022   PLT 140 (L) 02/17/2022   Lab Results  Component Value Date   INR 1.06 08/23/2016   Lab Results  Component Value Date   NA 134 (L) 02/16/2022   K 5.0 02/16/2022   CL 105 02/16/2022   CO2 25 02/16/2022   BUN 21 02/16/2022   CREATININE 1.33 (H) 02/16/2022   GLUCOSE 165 (H) 02/16/2022    Discharge Medications:   Allergies as of 02/17/2022       Reactions   Metformin And Related    Can't tolerate        Medication List     STOP taking these medications    aspirin 81 MG tablet Replaced by: aspirin EC 325 MG tablet       TAKE these medications    Accu-Chek FastClix Lancets Misc Use to check blood sugar 6 times per day dx code E11.9   aspirin EC 325 MG tablet Take 1 tablet (325 mg total) by mouth 2 (two) times daily. Start taking on: February 20, 2022 Replaces: aspirin 81 MG tablet   CALCIUM 600/VITAMIN D PO Take 1  tablet by mouth in the morning and at bedtime. Notes to patient: Resume home regimen   citalopram 20 MG tablet Commonly known as: CELEXA Take 20 mg by mouth at bedtime.   Fish Oil 1200 MG Caps Take 2,400 mg by mouth 2 (two) times daily. Notes to patient: Resume home regimen   glucose blood test strip Commonly known as: FREESTYLE TEST STRIPS Use as instructed to check blood sugar 6 times per day dx code E11.9   insulin aspart 100 UNIT/ML injection Commonly known as: novoLOG Inject 160 Units into the skin See admin instructions. About every 3 days with Omnipod insulin pump Notes to patient: Resume home regimen   lisinopril 10 MG tablet Commonly known as: ZESTRIL Take 10 mg by mouth daily.   mycophenolate 500 MG tablet Commonly known as: CELLCEPT Take 500 mg by mouth 2 (two) times daily.   omeprazole 20 MG  capsule Commonly known as: PRILOSEC Take 20 mg by mouth daily.   OmniPod Misc Use every 3 days   ondansetron 4 MG tablet Commonly known as: Zofran Take 1 tablet (4 mg total) by mouth every 8 (eight) hours as needed for nausea or vomiting.   oxyCODONE 5 MG immediate release tablet Commonly known as: Roxicodone Take 1 tablet (5 mg total) by mouth every 4 (four) hours as needed for severe pain.   rosuvastatin 10 MG tablet Commonly known as: CRESTOR Take 10 mg by mouth daily. Notes to patient: Resume home regimen   sennosides-docusate sodium 8.6-50 MG tablet Commonly known as: SENOKOT-S Take 2 tablets by mouth daily.   tacrolimus 0.5 MG capsule Commonly known as: PROGRAF Take 0.5 mg by mouth 2 (two) times daily.        Diagnostic Studies: DG Knee Left Port  Result Date: 02/15/2022 CLINICAL DATA:  [id number] S/P left unicompartmental knee replacement [id number] EXAM: PORTABLE LEFT KNEE - 1-2 VIEW COMPARISON:  None Available. FINDINGS: Postsurgical changes of medial compartment arthroplasty. Hardware is intact without evidence of loosening or periprosthetic fracture. Expected soft tissue changes including a joint effusion. There is mild patellofemoral osteoarthritis. IMPRESSION: Postsurgical changes of medial compartment arthroplasty. No evidence of immediate complication. Electronically Signed   By: Maurine Simmering M.D.   On: 02/15/2022 16:54    Disposition: Discharge disposition: 01-Home or Self Care          Follow-up Information     Marchia Bond, MD. Go on 02/28/2022.   Specialty: Orthopedic Surgery Why: your appointment is scheduled for 11:00 Contact information: [address] [place] [address] [facility], [place]. Go on 02/17/2022.   Why: Your outpatient physical therapy is scheduled for 1:00. Please arrive at 12:45 to complete your paperwork Contact information: [facility] [place] [address] [phone]                  Signed: Jola Baptist 02/18/2022, 3:36 PM

## 2022-02-23 DIAGNOSIS — M1712 Unilateral primary osteoarthritis, left knee: Secondary | ICD-10-CM | POA: Diagnosis not present

## 2022-02-23 DIAGNOSIS — M6281 Muscle weakness (generalized): Secondary | ICD-10-CM | POA: Diagnosis not present

## 2022-02-23 DIAGNOSIS — M25662 Stiffness of left knee, not elsewhere classified: Secondary | ICD-10-CM | POA: Diagnosis not present

## 2022-02-23 DIAGNOSIS — R262 Difficulty in walking, not elsewhere classified: Secondary | ICD-10-CM | POA: Diagnosis not present

## 2022-02-28 DIAGNOSIS — M1712 Unilateral primary osteoarthritis, left knee: Secondary | ICD-10-CM | POA: Diagnosis not present

## 2022-02-28 DIAGNOSIS — G4733 Obstructive sleep apnea (adult) (pediatric): Secondary | ICD-10-CM | POA: Diagnosis not present

## 2022-03-01 DIAGNOSIS — M25662 Stiffness of left knee, not elsewhere classified: Secondary | ICD-10-CM | POA: Diagnosis not present

## 2022-03-01 DIAGNOSIS — M1712 Unilateral primary osteoarthritis, left knee: Secondary | ICD-10-CM | POA: Diagnosis not present

## 2022-03-01 DIAGNOSIS — G4733 Obstructive sleep apnea (adult) (pediatric): Secondary | ICD-10-CM | POA: Diagnosis not present

## 2022-03-01 DIAGNOSIS — M6281 Muscle weakness (generalized): Secondary | ICD-10-CM | POA: Diagnosis not present

## 2022-03-01 DIAGNOSIS — R262 Difficulty in walking, not elsewhere classified: Secondary | ICD-10-CM | POA: Diagnosis not present

## 2022-03-03 DIAGNOSIS — M6281 Muscle weakness (generalized): Secondary | ICD-10-CM | POA: Diagnosis not present

## 2022-03-03 DIAGNOSIS — R262 Difficulty in walking, not elsewhere classified: Secondary | ICD-10-CM | POA: Diagnosis not present

## 2022-03-03 DIAGNOSIS — M25662 Stiffness of left knee, not elsewhere classified: Secondary | ICD-10-CM | POA: Diagnosis not present

## 2022-03-03 DIAGNOSIS — M1712 Unilateral primary osteoarthritis, left knee: Secondary | ICD-10-CM | POA: Diagnosis not present

## 2022-03-07 DIAGNOSIS — M1712 Unilateral primary osteoarthritis, left knee: Secondary | ICD-10-CM | POA: Diagnosis not present

## 2022-03-19 DIAGNOSIS — E1169 Type 2 diabetes mellitus with other specified complication: Secondary | ICD-10-CM | POA: Diagnosis not present

## 2022-03-19 DIAGNOSIS — E119 Type 2 diabetes mellitus without complications: Secondary | ICD-10-CM | POA: Diagnosis not present

## 2022-03-19 DIAGNOSIS — E118 Type 2 diabetes mellitus with unspecified complications: Secondary | ICD-10-CM | POA: Diagnosis not present

## 2022-03-19 DIAGNOSIS — Z794 Long term (current) use of insulin: Secondary | ICD-10-CM | POA: Diagnosis not present

## 2022-03-19 DIAGNOSIS — E11319 Type 2 diabetes mellitus with unspecified diabetic retinopathy without macular edema: Secondary | ICD-10-CM | POA: Diagnosis not present

## 2022-03-23 DIAGNOSIS — Z8546 Personal history of malignant neoplasm of prostate: Secondary | ICD-10-CM | POA: Diagnosis not present

## 2022-03-23 DIAGNOSIS — N3000 Acute cystitis without hematuria: Secondary | ICD-10-CM | POA: Diagnosis not present

## 2022-03-23 DIAGNOSIS — C61 Malignant neoplasm of prostate: Secondary | ICD-10-CM | POA: Diagnosis not present

## 2022-03-31 DIAGNOSIS — G4733 Obstructive sleep apnea (adult) (pediatric): Secondary | ICD-10-CM | POA: Diagnosis not present

## 2022-04-06 DIAGNOSIS — M1712 Unilateral primary osteoarthritis, left knee: Secondary | ICD-10-CM | POA: Diagnosis not present

## 2022-04-07 ENCOUNTER — Other Ambulatory Visit (HOSPITAL_COMMUNITY): Payer: Self-pay | Admitting: Adult Health

## 2022-04-07 DIAGNOSIS — N393 Stress incontinence (female) (male): Secondary | ICD-10-CM | POA: Diagnosis not present

## 2022-04-07 DIAGNOSIS — R972 Elevated prostate specific antigen [PSA]: Secondary | ICD-10-CM

## 2022-04-07 DIAGNOSIS — R9721 Rising PSA following treatment for malignant neoplasm of prostate: Secondary | ICD-10-CM | POA: Diagnosis not present

## 2022-04-08 ENCOUNTER — Encounter: Payer: Self-pay | Admitting: Physical Medicine and Rehabilitation

## 2022-04-12 DIAGNOSIS — Z794 Long term (current) use of insulin: Secondary | ICD-10-CM | POA: Diagnosis not present

## 2022-04-12 DIAGNOSIS — E11319 Type 2 diabetes mellitus with unspecified diabetic retinopathy without macular edema: Secondary | ICD-10-CM | POA: Diagnosis not present

## 2022-04-12 DIAGNOSIS — E119 Type 2 diabetes mellitus without complications: Secondary | ICD-10-CM | POA: Diagnosis not present

## 2022-04-12 DIAGNOSIS — E118 Type 2 diabetes mellitus with unspecified complications: Secondary | ICD-10-CM | POA: Diagnosis not present

## 2022-04-12 DIAGNOSIS — E1169 Type 2 diabetes mellitus with other specified complication: Secondary | ICD-10-CM | POA: Diagnosis not present

## 2022-04-13 ENCOUNTER — Encounter: Payer: Self-pay | Admitting: Physical Medicine and Rehabilitation

## 2022-04-13 ENCOUNTER — Encounter: Payer: Medicare HMO | Attending: Physical Medicine and Rehabilitation | Admitting: Physical Medicine and Rehabilitation

## 2022-04-13 VITALS — BP 121/80 | HR 88 | Ht 65.0 in | Wt 170.0 lb

## 2022-04-13 DIAGNOSIS — M25562 Pain in left knee: Secondary | ICD-10-CM | POA: Diagnosis not present

## 2022-04-13 DIAGNOSIS — D84821 Immunodeficiency due to drugs: Secondary | ICD-10-CM

## 2022-04-13 DIAGNOSIS — Z79899 Other long term (current) drug therapy: Secondary | ICD-10-CM | POA: Diagnosis not present

## 2022-04-13 DIAGNOSIS — Z96652 Presence of left artificial knee joint: Secondary | ICD-10-CM

## 2022-04-13 DIAGNOSIS — G4700 Insomnia, unspecified: Secondary | ICD-10-CM

## 2022-04-13 DIAGNOSIS — R3915 Urgency of urination: Secondary | ICD-10-CM | POA: Diagnosis not present

## 2022-04-13 DIAGNOSIS — M792 Neuralgia and neuritis, unspecified: Secondary | ICD-10-CM | POA: Diagnosis not present

## 2022-04-13 MED ORDER — CAPSAICIN 0.025 % EX LOTN: TOPICAL_LOTION | CUTANEOUS | 0 refills | Status: DC

## 2022-04-13 NOTE — Assessment & Plan Note (Signed)
Primary differential includes left femoral neuropathy vs. L3 radiculopathy  - Will schedule EMG LLE to evaluate for pathology, given patient is >6 week post-op with no improved symptoms  - Try capsaicin cream OTC locally over sensitive areas TID; if beneficial, may move to prescription strength cream  - Labs ordered today; if appropriate, will prescribe Amitriptyline 25 mg QHS for neuropathic pain. Patient has tried Lyrica and Gabapentin without benefit.   - Will review findings and medications at EMG visit; if no improvement or suspicion for radiculopathy based on results, will pursue imaging of low back.

## 2022-04-13 NOTE — Assessment & Plan Note (Addendum)
Pain is described as burning sensitivity consistent with neuropathic etiology, localized to medial knee prior to surgery but extending distally and proximally along medial leg immediately post-op.   Differential includes L3 radiculopathy vs. peripheral nerve injury s/p knee replacement (possible femoral nerve);  less likely CRPS, diabetic/immunosuppression related neuropathy; low index of suspicion for vascular etiology/DVT.   - NSAIDs relatively contraindicated due to Hx multiple heart transplants - Given neuropathic etiology, no obvious role for narcotic medications at this time - Referral paperwork queried [facility]; only approved indication is for diabetic neuropathy, which this patient's presentation is not currently consistent with. May reconsider if no improvement with alternative treatments.

## 2022-04-13 NOTE — Assessment & Plan Note (Signed)
-   Worsened by medial knee pain, sensitivity - Hopeful combination of nighttime amitriptyline and compression can improve tolerance of sensation of clothing/bed sheets and improve sleep

## 2022-04-13 NOTE — Progress Notes (Signed)
Subjective:    Patient ID: John [name], male    DOB: 1950/04/08, 72 y.o.   MRN: [id number]  HPI John [name] is a 72 y.o. year old male  who  has a past medical history of Arthritis, Cancer ([place]), Complication of anesthesia, Diabetes mellitus without complication ([place]), GERD (gastroesophageal reflux disease), Hypertension, Myocardial infarction [facility]), and Sleep apnea.   They are presenting to PM&R clinic as a new patient for pain management evaluation. They were referred by ortho Merlene Pulling PA-C  for treatment of left medial knee pain.   Source: Left medial thigh extending to just above his ankle Inciting incident: S/p TKR November 14th, 2023. Did have some intermittent isolated medial knee sensivitiy prior to TKR, but area has expanded proximally and distally in the immediate post-op period. Pt states for the operation they place a turnicate around the proximal thigh throughout the surgery, which is about where his pain starts.  Duration of pain: Constant, unremitting, since immediately post-op.  Description of pain: Stinging, burning "like a really bad sunburn". Worse on medial knee, which "pulsates".  Severity: On average 6 /10. At worst 10 /10.   Exacerbating factors: Wearing pants, sheets touching leg; he now sleeps  in a recliner to avoid these.  Remitting factors: Pregabalin helps, but does not make it go away. Manual compression on his medial knee seems to help the pain locally.  Red flag symptoms: Patient denies saddle anesthesia, loss of bowel or bladder continence, new weakness, new numbness/tingling; +pain waking up at nighttime.   Medications tried: Topical medications ( no effect) : Tried lidocaine gel, without benefit.  Nsaids : Has had 2 heart transplants, avoids NSAIDs.  Tylenol  (mild effect): Takes 2 tablets daily for post-op pain and swelling. He avoids taking more  Opiates  ( no effect): Stopped post-op oxy because it was not helpful and made him drowsy.  Gabapentin /  Lyrica  ( moderate effect): On Lyrica 150 mg BID, with benefit. Tried gabapentin up to 1200 daily, had some lightheadedness.  TCAs never tried SNRIs  never tried Other none  Other treatments: PT/OT  ( no effect): Finished post-op PT; good ROM, did well.  Accupuncture/chiropractor/massage none TENs unit  none Injections  none Surgery ( worsening effect) : See above; no Hx back surgery Other: HA1C 7.0, well controlled. Has been gaining weight. No Hx diabetic neuropathy. No Hx back issues. Does have Hx prostate CA s/p surgery, never on chemotherapy. Does have Hx 2x heart transplants, remains on immunosuppressive therapy, closely monitoring without Hx toxicity.   Goals for pain control: Improved comfort with wearing pants and sleeping.   Pain Inventory Average Pain 10 Pain Right Now 6 My pain is sharp, burning, and tingling  In the last 24 hours, has pain interfered with the following? General activity 5 Relation with others 0 Enjoyment of life 5 What TIME of day is your pain at its worst? evening and night Sleep (in general) Fair  Pain is worse with: walking and sitting Pain improves with: medication Relief from Meds: 4  walk without assistance ability to climb steps?  yes do you drive?  yes  employed # of hrs/week .  No problems in this area  Any changes since last visit?  no  Any changes since last visit?  no    Family History  Problem Relation Age of Onset   Heart disease Mother    Heart disease Father    Diabetes Brother    Cancer Brother  Cancer Niece    Cancer Niece    Social History   Socioeconomic History   Marital status: Married    Spouse name: Not on file   Number of children: Not on file   Years of education: Not on file   Highest education level: Not on file  Occupational History   Not on file  Tobacco Use   Smoking status: Former    Packs/day: 1.00    Years: 40.00    Total pack years: 40.00    Types: Cigarettes    Quit date: 22     Years since quitting: 26.0   Smokeless tobacco: Never   Tobacco comments:    quit smoking cigarettes in 1998  Vaping Use   Vaping Use: Never used  Substance and Sexual Activity   Alcohol use: Yes    Comment: rare   Drug use: No   Sexual activity: Not on file  Other Topics Concern   Not on file  Social History Narrative   Not on file   Social Determinants of Health   Financial Resource Strain: Not on file  Food Insecurity: No Food Insecurity (02/15/2022)   Hunger Vital Sign    Worried About Running Out of Food in the Last Year: Never true    Ran Out of Food in the Last Year: Never true  Transportation Needs: No Transportation Needs (02/15/2022)   PRAPARE - Hydrologist (Medical): No    Lack of Transportation (Non-Medical): No  Physical Activity: Not on file  Stress: Not on file  Social Connections: Not on file   Past Surgical History:  Procedure Laterality Date   APPENDECTOMY     BIOPSY  03/24/2021   Procedure: BIOPSY;  Surgeon: Wilford Corner, MD;  Location: [facility];  Service: Endoscopy;;   CARDIAC CATHETERIZATION     Novemer 2014   COLONOSCOPY     COLONOSCOPY WITH PROPOFOL N/A 06/03/2013   Procedure: COLONOSCOPY WITH PROPOFOL;  Surgeon: Lear Ng, MD;  Location: [place];  Service: Endoscopy;  Laterality: N/A;   COLONOSCOPY WITH PROPOFOL N/A 03/24/2021   Procedure: COLONOSCOPY WITH PROPOFOL;  Surgeon: Wilford Corner, MD;  Location: [facility];  Service: Endoscopy;  Laterality: N/A;   ESOPHAGOGASTRODUODENOSCOPY (EGD) WITH PROPOFOL N/A 06/03/2013   Procedure: ESOPHAGOGASTRODUODENOSCOPY (EGD) WITH PROPOFOL;  Surgeon: Lear Ng, MD;  Location: [place];  Service: Endoscopy;  Laterality: N/A;   HEART TRANSPLANT     Hx; of x 2, 2000 and 2013   INGUINAL HERNIA REPAIR Right 08/24/2016   Procedure: LAPAROSCOPIC  RIGHT INGUINAL HERNIA WITH MESH;  Surgeon: Ralene Ok, MD;  Location: [place];  Service:  General;  Laterality: Right;   PARTIAL KNEE ARTHROPLASTY Left 02/15/2022   Procedure: UNICOMPARTMENTAL KNEE;  Surgeon: Marchia Bond, MD;  Location: [facility];  Service: Orthopedics;  Laterality: Left;   PROSTATECTOMY     Past Medical History:  Diagnosis Date   Arthritis    Cancer ([place])    prostate   Complication of anesthesia    Problems with waking up and with intubation   Diabetes mellitus without complication ([facility])    GERD (gastroesophageal reflux disease)    Hypertension    Myocardial infarction ([place])    Sleep apnea    cpap   BP 121/80   Pulse 88   Ht '5\' 5"'$  (1.651 m)   Wt 170 lb (77.1 kg)   SpO2 94%   BMI 28.29 kg/m   Opioid Risk Score:   Fall  Risk Score:  `1  Depression screen [facility] 2/9     04/13/2022    9:27 AM  Depression screen PHQ 2/9  Decreased Interest 0  Down, Depressed, Hopeless 0  PHQ - 2 Score 0  Altered sleeping 1  Tired, decreased energy 0  Change in appetite 0  Feeling bad or failure about yourself  0  Trouble concentrating 0  Moving slowly or fidgety/restless 0  PHQ-9 Score 1  Difficult doing work/chores Not difficult at all      Review of Systems  Constitutional:  Negative for activity change and fatigue.  Cardiovascular:  Negative for leg swelling.  Endocrine: Positive for cold intolerance. Negative for heat intolerance.  Musculoskeletal:  Positive for gait problem. Negative for arthralgias, back pain, joint swelling and myalgias.  Skin:  Positive for color change. Negative for pallor and rash.  Neurological:  Positive for numbness. Negative for weakness.  Psychiatric/Behavioral:  Positive for sleep disturbance. Negative for dysphoric mood and suicidal ideas. The patient is not nervous/anxious.   All other systems reviewed and are negative.      Objective:   Physical Exam  Constitution: Appropriate appearance for age. No apparent distress  HEENT: PERRL, EOMI grossly intact.  Resp: CTAB. No rales, rhonchi, or wheezing. Cardio: RRR.  No mumurs, rubs, or gallops.  Peripheral pulses 2+ both DP and PTA. Negative Homan's sign.  Abdomen: Nondistended. Nontender. +bowel sounds. Psych: Appropriate mood and affect.  Neurologic Exam:   DTRs: Reflexes were 2+ in R patella, bilateral achilles. S/p L TKR, no patellar reflex. Babinsky: flexor responses b/l.   Hoffmans: negative b/l Sensory exam: revealed sensitivity to light touch greatest over left medial knee, extending to medial mid-calf and proximally to medial mid-thigh. Mild decreased sensation on left medial knee. No sensory deficits on posterior or lateral leg, or bilateral ankle or feet.  Motor exam: strength normal in all myotomal regions of bilateral lower extremities. No TTP in low back or paraspinals. Negative forward bending and facet loading exam.  Coordination: Fine motor coordination was normal.   Gait: Gait was normal.   Knee Exam: Left Inspection: No gross deformities seen, + post-surgical scar, well healed.  + nonpitting swelling with darkened, shiny skin isolated around left knee and medial calf. No effusion present.  Palpation: No increased warmth around joint. Sensitivity as above.  No TTP at the:  Quadriceps Tendon, Lateral Joint line, Tibial Tubercle, pes anserine bursa, fibular head, biceps femoris, semitendinosus.  No crepitus or deformity present.    ROM:  Flexion/extension WNL.  No hyperextension noted.  Strength Hamstring (bring knee back to table) 5/5 Quadriceps (kick out) 5/5      Assessment & Plan:  John [name] is a 72 y.o. year old male  who  has a past medical history of Arthritis, Prostate CA s/p surgery, Diabetes mellitus (HA1C 7.0), GERD (gastroesophageal reflux disease), Hypertension, Myocardial infarction [facility]) s/p 2x heart transplants on suppressive therapy, and Sleep apnea presenting to PM&R clinic as referral from Alice Ac[place] PA-C  for treatment of left medial knee pain, mildly present pre-op but extending proximally and  distally in the post-op period.   Left medial knee pain Assessment & Plan: Pain is described as burning sensitivity consistent with neuropathic etiology, localized to medial knee prior to surgery but extending distally and proximally along medial leg immediately post-op.   Differential includes L3 radiculopathy vs. peripheral nerve injury s/p knee replacement (possible femoral nerve);  less likely CRPS, diabetic/immunosuppression related neuropathy; low index of suspicion  for vascular etiology/DVT.   - NSAIDs relatively contraindicated due to Hx multiple heart transplants - Given neuropathic etiology, no obvious role for narcotic medications at this time - Referral paperwork queried [employer]; only approved indication is for diabetic neuropathy, which this patient's presentation is not currently consistent with. May reconsider if no improvement with alternative treatments.   Orders: -     Comprehensive metabolic panel  S/P left unicompartmental knee replacement Assessment & Plan: - Patient concerned regarding potential peripheral nerve injury from intraoperative turnicate use around proximal thigh; possible etiology given distributions consistent with anterior femoral cutaneous nerve and saphenous nerve, which could indicate a femoral nerve injury.  - Patient did complete post-op PT and has good strength, ROM in LE; no need to repeat therapies at this time.   - Given improvement with passive compression locally, recommended adjustable compression brace OTC/on [employer] to provide passive pressure    Orders: -     Comprehensive metabolic panel  Neuropathic pain Assessment & Plan: Primary differential includes left femoral neuropathy vs. L3 radiculopathy  - Will schedule EMG LLE to evaluate for pathology, given patient is >6 week post-op with no improved symptoms  - Try capsaicin cream OTC locally over sensitive areas TID; if beneficial, may move to prescription strength cream  - Labs  ordered today; if appropriate, will prescribe Amitriptyline 25 mg QHS for neuropathic pain. Patient has tried Lyrica and Gabapentin without benefit.   - Will review findings and medications at EMG visit; if no improvement or suspicion for radiculopathy based on results, will pursue imaging of low back.   Orders: -     Comprehensive metabolic panel  Immunosuppression due to drug therapy ([place]) -     Comprehensive metabolic panel  Insomnia, unspecified type Assessment & Plan: - Worsened by medial knee pain, sensitivity - Hopeful combination of nighttime amitriptyline and compression can improve tolerance of sensation of clothing/bed sheets and improve sleep   Other orders -     Capsaicin; Patient to obtain over the counter. Apply to left medial knee 3 times daily for minimum 15 minutes. Wipe off when finished. Avoid touching mouth or eyes with cream.  Dispense: 1 Bottle; Refill: [place], DO 04/13/2022

## 2022-04-13 NOTE — Patient Instructions (Addendum)
Apply ove rthe counter capsaicin cream to your medial left knee three times daily for 15-20 minutes minimum. If no relief or symptoms intolerable, wipe off cream.   Please obtain a wrapping compressive knee brace to see if passive pressure improved pain tolerance. Example as below:  [email or web address]  Once bloodwork is back, I will call you to discuss starting Amitriptyline at nighttime for nerve pain.   I will schedule you for an EMG to evaluate for nerve damage.   Please message or call clinic within 1-2 weeks to report effects of these interventions. I will see you back in 1 month.

## 2022-04-13 NOTE — Assessment & Plan Note (Addendum)
-   Patient concerned regarding potential peripheral nerve injury from intraoperative turnicate use around proximal thigh; possible etiology given distributions consistent with anterior femoral cutaneous nerve and saphenous nerve, which could indicate a femoral nerve injury.  - Patient did complete post-op PT and has good strength, ROM in LE; no need to repeat therapies at this time.   - Given improvement with passive compression locally, recommended adjustable compression brace OTC/on [employer] to provide passive pressure

## 2022-04-14 ENCOUNTER — Telehealth: Payer: Self-pay | Admitting: Physical Medicine and Rehabilitation

## 2022-04-14 DIAGNOSIS — M792 Neuralgia and neuritis, unspecified: Secondary | ICD-10-CM

## 2022-04-14 DIAGNOSIS — G4700 Insomnia, unspecified: Secondary | ICD-10-CM

## 2022-04-14 LAB — COMPREHENSIVE METABOLIC PANEL
ALT: 28 IU/L (ref 0–44)
AST: 26 IU/L (ref 0–40)
Albumin/Globulin Ratio: 2.1 (ref 1.2–2.2)
Albumin: 4.6 g/dL (ref 3.9–4.9)
Alkaline Phosphatase: 75 IU/L (ref 44–121)
BUN/Creatinine Ratio: 15 (ref 10–24)
BUN: 15 mg/dL (ref 8–27)
Bilirubin Total: 0.7 mg/dL (ref 0.0–1.2)
CO2: 24 mmol/L (ref 20–29)
Calcium: 9.7 mg/dL (ref 8.6–10.2)
Chloride: 99 mmol/L (ref 96–106)
Creatinine, Ser: 0.97 mg/dL (ref 0.76–1.27)
Globulin, Total: 2.2 g/dL (ref 1.5–4.5)
Glucose: 144 mg/dL — ABNORMAL HIGH (ref 70–99)
Potassium: 4.6 mmol/L (ref 3.5–5.2)
Sodium: 142 mmol/L (ref 134–144)
Total Protein: 6.8 g/dL (ref 6.0–8.5)
eGFR: 85 mL/min/{1.73_m2} (ref >59)

## 2022-04-14 MED ORDER — AMITRIPTYLINE HCL 25 MG PO TABS: ORAL_TABLET | ORAL | 3 refills | Status: DC

## 2022-04-14 NOTE — Telephone Encounter (Signed)
Called patient to discuss recent CMP and message attached. Labs look WNL for renal and liver function, no concerning findings. Left VM with patient plan as below:  - Prescribed Amitriptyline 25 mg #60 tabs for neuropathic pain. Will start with 1 tablet QHS, after 1 week may increase to 2 tablets QHS if tolerating. Call clinic to report effects in 1-2 weeks.   - Can try capsaicin cream 5.7% 1 application TID for 97-28 minute intervals, recommend continuing treatment for at least 4 weeks to evaluate effect.    Gertie Gowda, DO 04/14/2022

## 2022-04-14 NOTE — Telephone Encounter (Signed)
Patient was prescribed a capsaicin cream 0.025 and all he could find was 0.1 cream.  Would like to know if that would be okay.  Please call patient [phone].

## 2022-04-14 NOTE — Addendum Note (Signed)
Addended by: Durel Salts on: 04/14/2022 01:07 AM   Modules accepted: Level of Service

## 2022-04-14 NOTE — Telephone Encounter (Signed)
Called patient  to follow up w/ Dr.Engler Phone call and patient advised the insurance needed prior auth.  Prior Auth submitted via phone call: [id number] Case#: [id number] Approved will fax over auth to [place].

## 2022-04-15 ENCOUNTER — Telehealth: Payer: Self-pay | Admitting: Physical Medicine and Rehabilitation

## 2022-04-15 NOTE — Telephone Encounter (Signed)
Patient needs to clarify if he needs to stop taking Lyrica.  Please call patient @ [phone].

## 2022-04-19 ENCOUNTER — Telehealth: Payer: Self-pay

## 2022-04-19 NOTE — Telephone Encounter (Signed)
PA submitted for Amitriptyline. The patient currently has access to the requested medication and a Prior Authorization is not needed for the patient/medication.

## 2022-04-20 DIAGNOSIS — W19XXXA Unspecified fall, initial encounter: Secondary | ICD-10-CM | POA: Diagnosis not present

## 2022-04-20 DIAGNOSIS — S8001XD Contusion of right knee, subsequent encounter: Secondary | ICD-10-CM | POA: Diagnosis not present

## 2022-04-20 DIAGNOSIS — I10 Essential (primary) hypertension: Secondary | ICD-10-CM | POA: Diagnosis not present

## 2022-04-20 DIAGNOSIS — D849 Immunodeficiency, unspecified: Secondary | ICD-10-CM | POA: Diagnosis not present

## 2022-04-20 DIAGNOSIS — E1169 Type 2 diabetes mellitus with other specified complication: Secondary | ICD-10-CM | POA: Diagnosis not present

## 2022-04-20 DIAGNOSIS — Z941 Heart transplant status: Secondary | ICD-10-CM | POA: Diagnosis not present

## 2022-04-20 DIAGNOSIS — T148XXA Other injury of unspecified body region, initial encounter: Secondary | ICD-10-CM | POA: Diagnosis not present

## 2022-04-25 ENCOUNTER — Encounter: Payer: Self-pay | Admitting: Physical Medicine and Rehabilitation

## 2022-04-25 ENCOUNTER — Telehealth: Payer: Self-pay | Admitting: Physical Medicine and Rehabilitation

## 2022-04-25 NOTE — Telephone Encounter (Signed)
Patient was told to call Dr. Tressa Busman and let her know how things are going.  He said that the current plan of treatment seems to be helping, issue has gone away, but doing better.  He said cream that you prescribed to him didn't work, said it makes it worse, so he stopped that medication.

## 2022-04-26 ENCOUNTER — Other Ambulatory Visit: Payer: Self-pay | Admitting: Physical Medicine and Rehabilitation

## 2022-04-26 MED ORDER — PREGABALIN 150 MG PO CAPS: 150.0000 mg | ORAL_CAPSULE | Freq: Two times a day (BID) | ORAL | 3 refills | Status: DC

## 2022-05-01 DIAGNOSIS — G4733 Obstructive sleep apnea (adult) (pediatric): Secondary | ICD-10-CM | POA: Diagnosis not present

## 2022-05-02 DIAGNOSIS — G4733 Obstructive sleep apnea (adult) (pediatric): Secondary | ICD-10-CM | POA: Diagnosis not present

## 2022-05-04 DIAGNOSIS — E1122 Type 2 diabetes mellitus with diabetic chronic kidney disease: Secondary | ICD-10-CM | POA: Diagnosis not present

## 2022-05-04 DIAGNOSIS — N182 Chronic kidney disease, stage 2 (mild): Secondary | ICD-10-CM | POA: Diagnosis not present

## 2022-05-04 DIAGNOSIS — I130 Hypertensive heart and chronic kidney disease with heart failure and stage 1 through stage 4 chronic kidney disease, or unspecified chronic kidney disease: Secondary | ICD-10-CM | POA: Diagnosis not present

## 2022-05-04 DIAGNOSIS — M1712 Unilateral primary osteoarthritis, left knee: Secondary | ICD-10-CM | POA: Diagnosis not present

## 2022-05-04 DIAGNOSIS — I5030 Unspecified diastolic (congestive) heart failure: Secondary | ICD-10-CM | POA: Diagnosis not present

## 2022-05-04 DIAGNOSIS — C61 Malignant neoplasm of prostate: Secondary | ICD-10-CM | POA: Diagnosis not present

## 2022-05-04 DIAGNOSIS — I700 Atherosclerosis of aorta: Secondary | ICD-10-CM | POA: Diagnosis not present

## 2022-05-04 DIAGNOSIS — E782 Mixed hyperlipidemia: Secondary | ICD-10-CM | POA: Diagnosis not present

## 2022-05-04 DIAGNOSIS — D692 Other nonthrombocytopenic purpura: Secondary | ICD-10-CM | POA: Diagnosis not present

## 2022-05-09 ENCOUNTER — Other Ambulatory Visit: Payer: Self-pay | Admitting: Physician Assistant

## 2022-05-09 DIAGNOSIS — D751 Secondary polycythemia: Secondary | ICD-10-CM

## 2022-05-10 ENCOUNTER — Inpatient Hospital Stay (HOSPITAL_BASED_OUTPATIENT_CLINIC_OR_DEPARTMENT_OTHER): Payer: Medicare HMO | Admitting: Physician Assistant

## 2022-05-10 ENCOUNTER — Inpatient Hospital Stay: Payer: Medicare HMO | Attending: Physician Assistant

## 2022-05-10 DIAGNOSIS — D751 Secondary polycythemia: Secondary | ICD-10-CM | POA: Diagnosis not present

## 2022-05-10 DIAGNOSIS — Z809 Family history of malignant neoplasm, unspecified: Secondary | ICD-10-CM | POA: Insufficient documentation

## 2022-05-10 DIAGNOSIS — G4733 Obstructive sleep apnea (adult) (pediatric): Secondary | ICD-10-CM | POA: Insufficient documentation

## 2022-05-10 DIAGNOSIS — I10 Essential (primary) hypertension: Secondary | ICD-10-CM | POA: Diagnosis not present

## 2022-05-10 DIAGNOSIS — Z87891 Personal history of nicotine dependence: Secondary | ICD-10-CM | POA: Insufficient documentation

## 2022-05-10 DIAGNOSIS — E119 Type 2 diabetes mellitus without complications: Secondary | ICD-10-CM | POA: Insufficient documentation

## 2022-05-10 DIAGNOSIS — D696 Thrombocytopenia, unspecified: Secondary | ICD-10-CM | POA: Insufficient documentation

## 2022-05-10 LAB — CBC WITH DIFFERENTIAL (CANCER CENTER ONLY)
Abs Immature Granulocytes: 0.02 10*3/uL (ref 0.00–0.07)
Basophils Absolute: 0 10*3/uL (ref 0.0–0.1)
Basophils Relative: 1 %
Eosinophils Absolute: 0.3 10*3/uL (ref 0.0–0.5)
Eosinophils Relative: 8 %
HCT: 52.3 % — ABNORMAL HIGH (ref 39.0–52.0)
Hemoglobin: 18.1 g/dL — ABNORMAL HIGH (ref 13.0–17.0)
Immature Granulocytes: 1 %
Lymphocytes Relative: 37 %
Lymphs Abs: 1.6 10*3/uL (ref 0.7–4.0)
MCH: 30.5 pg (ref 26.0–34.0)
MCHC: 34.6 g/dL (ref 30.0–36.0)
MCV: 88.2 fL (ref 80.0–100.0)
Monocytes Absolute: 0.5 10*3/uL (ref 0.1–1.0)
Monocytes Relative: 11 %
Neutro Abs: 1.9 10*3/uL (ref 1.7–7.7)
Neutrophils Relative %: 42 %
Platelet Count: 135 10*3/uL — ABNORMAL LOW (ref 150–400)
RBC: 5.93 MIL/uL — ABNORMAL HIGH (ref 4.22–5.81)
RDW: 12.8 % (ref 11.5–15.5)
WBC Count: 4.3 10*3/uL (ref 4.0–10.5)
nRBC: 0 % (ref 0.0–0.2)

## 2022-05-10 LAB — CMP (CANCER CENTER ONLY)
ALT: 30 U/L (ref 0–44)
AST: 22 U/L (ref 15–41)
Albumin: 3.7 g/dL (ref 3.5–5.0)
Alkaline Phosphatase: 68 U/L (ref 38–126)
Anion gap: 4 — ABNORMAL LOW (ref 5–15)
BUN: 12 mg/dL (ref 8–23)
CO2: 30 mmol/L (ref 22–32)
Calcium: 8.9 mg/dL (ref 8.9–10.3)
Chloride: 104 mmol/L (ref 98–111)
Creatinine: 1.02 mg/dL (ref 0.61–1.24)
GFR, Estimated: >60 mL/min (ref >60)
Glucose, Bld: 172 mg/dL — ABNORMAL HIGH (ref 70–99)
Potassium: 4.3 mmol/L (ref 3.5–5.1)
Sodium: 138 mmol/L (ref 135–145)
Total Bilirubin: 0.9 mg/dL (ref 0.3–1.2)
Total Protein: 5.9 g/dL — ABNORMAL LOW (ref 6.5–8.1)

## 2022-05-11 ENCOUNTER — Encounter: Payer: Medicare HMO | Admitting: Physical Medicine and Rehabilitation

## 2022-05-11 ENCOUNTER — Ambulatory Visit: Payer: Medicare HMO | Admitting: Physical Medicine and Rehabilitation

## 2022-05-11 ENCOUNTER — Telehealth: Payer: Self-pay

## 2022-05-11 NOTE — Telephone Encounter (Signed)
Spoke to Dr. Tressa Busman and she states that patient only needs EMG if wants to find out where the pain if coming from. If the pain progress then he can schedule for an EMG. Patient aware

## 2022-05-11 NOTE — Telephone Encounter (Signed)
Patient states he thinks he may not need EMG this afternoon. He states the nerve pain has subsided. He has been taking Pregabalin and Amitriptyline and it is working. Please advise.

## 2022-05-12 DIAGNOSIS — E118 Type 2 diabetes mellitus with unspecified complications: Secondary | ICD-10-CM | POA: Diagnosis not present

## 2022-05-12 DIAGNOSIS — E119 Type 2 diabetes mellitus without complications: Secondary | ICD-10-CM | POA: Diagnosis not present

## 2022-05-12 DIAGNOSIS — E1169 Type 2 diabetes mellitus with other specified complication: Secondary | ICD-10-CM | POA: Diagnosis not present

## 2022-05-12 DIAGNOSIS — E11319 Type 2 diabetes mellitus with unspecified diabetic retinopathy without macular edema: Secondary | ICD-10-CM | POA: Diagnosis not present

## 2022-05-12 DIAGNOSIS — Z794 Long term (current) use of insulin: Secondary | ICD-10-CM | POA: Diagnosis not present

## 2022-05-12 NOTE — Progress Notes (Signed)
[place] [phone]   [phone]  PROGRESS NOTE  Patient Care Team: Mayra Neer, MD as PCP - General (Family Medicine)   CHIEF COMPLAINTS/PURPOSE OF CONSULTATION:  Secondary polycythemia  HISTORY OF PRESENTING ILLNESS:  John [name] 72 y.o. male returns for a follow up for secondary polycythemia most likely secondary to OSA. He is unaccompanied for this visit.   On exam today, John [name] reports his energy and appetite are stable. He continues on the CPAP machine without any issues. He denies GI symptoms including nausea, vomiting, diarrhea or constipation. He denies easy bruising or signs of active bleeding.   He denies any fevers, chills, night sweats, shortness of breath, chest pain, cough, headaches, dizziness, peripheral edema or skin changes.  He has no other complaints.  Rest of the 10 point ROS is below.  MEDICAL HISTORY:  Past Medical History:  Diagnosis Date   Arthritis    Cancer ([place])    prostate   Complication of anesthesia    Problems with waking up and with intubation   Diabetes mellitus without complication ([place])    GERD (gastroesophageal reflux disease)    Hypertension    Myocardial infarction [facility])    Sleep apnea    cpap    SURGICAL HISTORY: Past Surgical History:  Procedure Laterality Date   APPENDECTOMY     BIOPSY  03/24/2021   Procedure: BIOPSY;  Surgeon: Wilford Corner, MD;  [facility];  Service: Endoscopy;;   CARDIAC CATHETERIZATION     Novemer 2014   COLONOSCOPY     COLONOSCOPY WITH PROPOFOL N/A 06/03/2013   Procedure: COLONOSCOPY WITH PROPOFOL;  Surgeon: Lear Ng, MD;  Location: [place];  Service: Endoscopy;  Laterality: N/A;   COLONOSCOPY WITH PROPOFOL N/A 03/24/2021   Procedure: COLONOSCOPY WITH PROPOFOL;  Surgeon: Wilford Corner, MD;  [facility];  Service: Endoscopy;  Laterality: N/A;   ESOPHAGOGASTRODUODENOSCOPY (EGD) WITH PROPOFOL N/A 06/03/2013    Procedure: ESOPHAGOGASTRODUODENOSCOPY (EGD) WITH PROPOFOL;  Surgeon: Lear Ng, MD;  Location: [place];  Service: Endoscopy;  Laterality: N/A;   HEART TRANSPLANT     Hx; of x 2, 2000 and 2013   INGUINAL HERNIA REPAIR Right 08/24/2016   Procedure: LAPAROSCOPIC  RIGHT INGUINAL HERNIA WITH MESH;  Surgeon: Ralene Ok, MD;  Location: [place];  Service: General;  Laterality: Right;   PARTIAL KNEE ARTHROPLASTY Left 02/15/2022   Procedure: UNICOMPARTMENTAL KNEE;  Surgeon: Marchia Bond, MD;  [facility];  Service: Orthopedics;  Laterality: Left;   PROSTATECTOMY      SOCIAL HISTORY: Social History   Socioeconomic History   Marital status: Married    Spouse name: Not on file   Number of children: Not on file   Years of education: Not on file   Highest education level: Not on file  Occupational History   Not on file  Tobacco Use   Smoking status: Former    Packs/day: 1.00    Years: 40.00    Total pack years: 40.00    Types: Cigarettes    Quit date: 10    Years since quitting: 26.1   Smokeless tobacco: Never   Tobacco comments:    quit smoking cigarettes in 1998  Vaping Use   Vaping Use: Never used  Substance and Sexual Activity   Alcohol use: Yes    Comment: rare   Drug use: No   Sexual activity: Not on file  Other Topics Concern   Not on file  Social History  Narrative   Not on file   Social Determinants of Health   Financial Resource Strain: Not on file  Food Insecurity: No Food Insecurity (02/15/2022)   Hunger Vital Sign    Worried About Running Out of Food in the Last Year: Never true    Ran Out of Food in the Last Year: Never true  Transportation Needs: No Transportation Needs (02/15/2022)   PRAPARE - Hydrologist (Medical): No    Lack of Transportation (Non-Medical): No  Physical Activity: Not on file  Stress: Not on file  Social Connections: Not on file  Intimate Partner Violence: Not At Risk (02/15/2022)    Humiliation, Afraid, Rape, and Kick questionnaire    Fear of Current or Ex-Partner: No    Emotionally Abused: No    Physically Abused: No    Sexually Abused: No    FAMILY HISTORY: Family History  Problem Relation Age of Onset   Heart disease Mother    Heart disease Father    Diabetes Brother    Cancer Brother    Cancer Niece    Cancer Niece     ALLERGIES:  is allergic to metformin and related.  MEDICATIONS:  Current Outpatient Medications  Medication Sig Dispense Refill   ACCU-CHEK FASTCLIX LANCETS MISC Use to check blood sugar 6 times per day dx code E11.9 204 each 3   amitriptyline (ELAVIL) 25 MG tablet Take 1 tablet at bedtime for 1 week. Then, call Dr. Francina Ames office to report medication effects. If you are tolerating it well, after 1 week can increase to 2 tablets at bedtime. 60 tablet 3   aspirin EC 81 MG tablet Take 81 mg by mouth daily. Swallow whole.     Calcium Carb-Cholecalciferol (CALCIUM 600/VITAMIN D PO) Take 1 tablet by mouth in the morning and at bedtime.     citalopram (CELEXA) 20 MG tablet Take 20 mg by mouth at bedtime.     glucose blood (FREESTYLE TEST STRIPS) test strip Use as instructed to check blood sugar 6 times per day dx code E11.9 200 each 3   insulin aspart (NOVOLOG) 100 UNIT/ML injection Inject 160 Units into the skin See admin instructions. About every 3 days with Omnipod insulin pump     Insulin Disposable Pump (OMNIPOD) MISC Use every 3 days 30 each 2   lisinopril (PRINIVIL,ZESTRIL) 10 MG tablet Take 10 mg by mouth daily.     mycophenolate (CELLCEPT) 500 MG tablet Take 500 mg by mouth 2 (two) times daily.     Omega-3 Fatty Acids (FISH OIL) 1200 MG CAPS Take 2,400 mg by mouth 2 (two) times daily.     omeprazole (PRILOSEC) 20 MG capsule Take 20 mg by mouth daily.     pregabalin (LYRICA) 150 MG capsule Take 1 capsule (150 mg total) by mouth 2 (two) times daily. 60 capsule 3   rosuvastatin (CRESTOR) 10 MG tablet Take 10 mg by mouth daily.      tacrolimus (PROGRAF) 0.5 MG capsule Take 0.5 mg by mouth 2 (two) times daily.     Capsaicin 0.025 % LOTN Patient to obtain over the counter. Apply to left medial knee 3 times daily for minimum 15 minutes. Wipe off when finished. Avoid touching mouth or eyes with cream. 1 Bottle 0   No current facility-administered medications for this visit.    REVIEW OF SYSTEMS:   Constitutional: ( - ) fevers, ( - )  chills , ( - ) night sweats Eyes: ( - )  blurriness of vision, ( - ) double vision, ( - ) watery eyes Ears, nose, mouth, throat, and face: ( - ) mucositis, ( - ) sore throat Respiratory: ( - ) cough, ( - ) dyspnea, ( - ) wheezes Cardiovascular: ( - ) palpitation, ( - ) chest discomfort, ( - ) lower extremity swelling Gastrointestinal:  ( - ) nausea, ( - ) heartburn, ( - ) change in bowel habits Skin: ( - ) abnormal skin rashes Lymphatics: ( - ) new lymphadenopathy, ( - ) easy bruising Neurological: ( - ) numbness, ( - ) tingling, ( - ) new weaknesses Behavioral/Psych: ( - ) mood change, ( - ) new changes  All other systems were reviewed with the patient and are negative.  PHYSICAL EXAMINATION: ECOG PERFORMANCE STATUS: 1 - Symptomatic but completely ambulatory  There were no vitals filed for this visit.  There were no vitals filed for this visit.   GENERAL: well appearing male in NAD  SKIN: skin color, texture, turgor are normal, no rashes or significant lesions EYES: conjunctiva are pink and non-injected, sclera clear LUNGS: clear to auscultation and percussion with normal breathing effort HEART: regular rate & rhythm and no murmurs and no lower extremity edema Musculoskeletal: no cyanosis of digits and no clubbing  PSYCH: alert & oriented x 3, fluent speech NEURO: no focal motor/sensory deficits  LABORATORY DATA:  I have reviewed the data as listed    Latest Ref Rng & Units 05/10/2022    9:23 AM 02/17/2022    3:14 AM 02/16/2022    2:47 AM  CBC  WBC 4.0 - 10.5 K/uL 4.3  8.7  8.5    Hemoglobin 13.0 - 17.0 g/dL 18.1  16.3  17.0   Hematocrit 39.0 - 52.0 % 52.3  47.2  50.4   Platelets 150 - 400 K/uL 135  140  152        Latest Ref Rng & Units 05/10/2022    9:23 AM 04/13/2022    2:34 PM 02/16/2022    2:47 AM  CMP  Glucose 70 - 99 mg/dL 172  144  165   BUN 8 - 23 mg/dL 12  15  21   $ Creatinine 0.61 - 1.24 mg/dL 1.02  0.97  1.33   Sodium 135 - 145 mmol/L 138  142  134   Potassium 3.5 - 5.1 mmol/L 4.3  4.6  5.0   Chloride 98 - 111 mmol/L 104  99  105   CO2 22 - 32 mmol/L 30  24  25   $ Calcium 8.9 - 10.3 mg/dL 8.9  9.7  8.2   Total Protein 6.5 - 8.1 g/dL 5.9  6.8    Total Bilirubin 0.3 - 1.2 mg/dL 0.9  0.7    Alkaline Phos 38 - 126 U/L 68  75    AST 15 - 41 U/L 22  26    ALT 0 - 44 U/L 30  28     ASSESSMENT & PLAN John [name] is a 72 y.o. male who presents to the hematology clinic for secondary polycythemia.  #Secondary polycythemia: --Most likely secondary to obstructive sleep apnea and currently on CPAP for 1-2 years. Recommend to follow up to retitrate CPAP if Hgb levels continue to rise.  --Workup from 11/02/2021 including MPN  panel and BCR/ABL FISH, both unremarkable.  --patient is a non smoker and does not use any testosterone containing products --Reviewed labs over the last 6 months. Hgb levels have oscillated back and forth from  normal. Today's labs Hgb is 18.1.  --No further hematologic workup is required at this time.  --RTC as needed or if Hgb starts to increase above his baseline (19 g/dL or greater)  #Thrombocytopenia: --Mild and oscillating over the last 6 months --US abdomen from 08/22/2019 did show hepatic steatosis which is a risk factor.  --Monitor for and consider further workup if Plt <100K  No orders of the defined types were placed in this encounter.   All questions were answered. The patient knows to call the clinic with any problems, questions or concerns.  I have spent a total of  25 minutes minutes of face-to-face and  non-face-to-face time, preparing to see the patient, performing a medically appropriate examination, counseling and educating the patient, documenting clinical information in the electronic health record, and care coordination.   Dede Query, PA-C Department of Hematology/Oncology [place] at [facility] Phone: [phone]

## 2022-05-24 DIAGNOSIS — Z2989 Encounter for other specified prophylactic measures: Secondary | ICD-10-CM | POA: Diagnosis not present

## 2022-05-24 DIAGNOSIS — Z13228 Encounter for screening for other metabolic disorders: Secondary | ICD-10-CM | POA: Diagnosis not present

## 2022-05-24 DIAGNOSIS — Z130 Encounter for screening for diseases of the blood and blood-forming organs and certain disorders involving the immune mechanism: Secondary | ICD-10-CM | POA: Diagnosis not present

## 2022-05-24 DIAGNOSIS — E119 Type 2 diabetes mellitus without complications: Secondary | ICD-10-CM | POA: Diagnosis not present

## 2022-05-24 DIAGNOSIS — Z794 Long term (current) use of insulin: Secondary | ICD-10-CM | POA: Diagnosis not present

## 2022-05-24 DIAGNOSIS — Z48298 Encounter for aftercare following other organ transplant: Secondary | ICD-10-CM | POA: Diagnosis not present

## 2022-05-24 DIAGNOSIS — Z79899 Other long term (current) drug therapy: Secondary | ICD-10-CM | POA: Diagnosis not present

## 2022-05-24 DIAGNOSIS — Z1329 Encounter for screening for other suspected endocrine disorder: Secondary | ICD-10-CM | POA: Diagnosis not present

## 2022-05-24 DIAGNOSIS — Z941 Heart transplant status: Secondary | ICD-10-CM | POA: Diagnosis not present

## 2022-05-24 DIAGNOSIS — G4733 Obstructive sleep apnea (adult) (pediatric): Secondary | ICD-10-CM | POA: Diagnosis not present

## 2022-05-24 DIAGNOSIS — Z1321 Encounter for screening for nutritional disorder: Secondary | ICD-10-CM | POA: Diagnosis not present

## 2022-05-27 DIAGNOSIS — G4733 Obstructive sleep apnea (adult) (pediatric): Secondary | ICD-10-CM | POA: Diagnosis not present

## 2022-05-27 DIAGNOSIS — Z125 Encounter for screening for malignant neoplasm of prostate: Secondary | ICD-10-CM | POA: Diagnosis not present

## 2022-05-27 DIAGNOSIS — Z2989 Encounter for other specified prophylactic measures: Secondary | ICD-10-CM | POA: Diagnosis not present

## 2022-05-27 DIAGNOSIS — Z941 Heart transplant status: Secondary | ICD-10-CM | POA: Diagnosis not present

## 2022-05-27 DIAGNOSIS — R972 Elevated prostate specific antigen [PSA]: Secondary | ICD-10-CM | POA: Diagnosis not present

## 2022-05-27 DIAGNOSIS — Z79621 Long term (current) use of calcineurin inhibitor: Secondary | ICD-10-CM | POA: Diagnosis not present

## 2022-05-27 DIAGNOSIS — N182 Chronic kidney disease, stage 2 (mild): Secondary | ICD-10-CM | POA: Diagnosis not present

## 2022-05-27 DIAGNOSIS — Z79899 Other long term (current) drug therapy: Secondary | ICD-10-CM | POA: Diagnosis not present

## 2022-05-27 DIAGNOSIS — Z8546 Personal history of malignant neoplasm of prostate: Secondary | ICD-10-CM | POA: Diagnosis not present

## 2022-05-27 DIAGNOSIS — Z9989 Dependence on other enabling machines and devices: Secondary | ICD-10-CM | POA: Diagnosis not present

## 2022-05-27 DIAGNOSIS — E1122 Type 2 diabetes mellitus with diabetic chronic kidney disease: Secondary | ICD-10-CM | POA: Diagnosis not present

## 2022-05-27 DIAGNOSIS — Z79624 Long term (current) use of inhibitors of nucleotide synthesis: Secondary | ICD-10-CM | POA: Diagnosis not present

## 2022-05-27 DIAGNOSIS — Z48298 Encounter for aftercare following other organ transplant: Secondary | ICD-10-CM | POA: Diagnosis not present

## 2022-05-27 DIAGNOSIS — I129 Hypertensive chronic kidney disease with stage 1 through stage 4 chronic kidney disease, or unspecified chronic kidney disease: Secondary | ICD-10-CM | POA: Diagnosis not present

## 2022-05-27 DIAGNOSIS — Z4821 Encounter for aftercare following heart transplant: Secondary | ICD-10-CM | POA: Diagnosis not present

## 2022-05-27 DIAGNOSIS — D84821 Immunodeficiency due to drugs: Secondary | ICD-10-CM | POA: Diagnosis not present

## 2022-05-27 DIAGNOSIS — D751 Secondary polycythemia: Secondary | ICD-10-CM | POA: Diagnosis not present

## 2022-05-27 DIAGNOSIS — Z794 Long term (current) use of insulin: Secondary | ICD-10-CM | POA: Diagnosis not present

## 2022-05-30 DIAGNOSIS — G4733 Obstructive sleep apnea (adult) (pediatric): Secondary | ICD-10-CM | POA: Diagnosis not present

## 2022-05-30 DIAGNOSIS — E119 Type 2 diabetes mellitus without complications: Secondary | ICD-10-CM | POA: Diagnosis not present

## 2022-05-31 DIAGNOSIS — G4733 Obstructive sleep apnea (adult) (pediatric): Secondary | ICD-10-CM | POA: Diagnosis not present

## 2022-06-04 DIAGNOSIS — G4733 Obstructive sleep apnea (adult) (pediatric): Secondary | ICD-10-CM | POA: Diagnosis not present

## 2022-06-07 ENCOUNTER — Other Ambulatory Visit: Payer: Self-pay | Admitting: Physical Medicine and Rehabilitation

## 2022-06-11 DIAGNOSIS — E118 Type 2 diabetes mellitus with unspecified complications: Secondary | ICD-10-CM | POA: Diagnosis not present

## 2022-06-11 DIAGNOSIS — E119 Type 2 diabetes mellitus without complications: Secondary | ICD-10-CM | POA: Diagnosis not present

## 2022-06-11 DIAGNOSIS — E11319 Type 2 diabetes mellitus with unspecified diabetic retinopathy without macular edema: Secondary | ICD-10-CM | POA: Diagnosis not present

## 2022-06-11 DIAGNOSIS — Z794 Long term (current) use of insulin: Secondary | ICD-10-CM | POA: Diagnosis not present

## 2022-06-11 DIAGNOSIS — E1169 Type 2 diabetes mellitus with other specified complication: Secondary | ICD-10-CM | POA: Diagnosis not present

## 2022-06-13 ENCOUNTER — Emergency Department (HOSPITAL_COMMUNITY)
Admission: EM | Admit: 2022-06-13 | Discharge: 2022-06-13 | Disposition: A | Discharge status: Home / Self Care | Payer: Medicare HMO | Attending: Emergency Medicine | Admitting: Emergency Medicine

## 2022-06-13 ENCOUNTER — Other Ambulatory Visit: Payer: Self-pay

## 2022-06-13 ENCOUNTER — Emergency Department (HOSPITAL_COMMUNITY): Payer: Medicare HMO

## 2022-06-13 ENCOUNTER — Encounter (HOSPITAL_COMMUNITY): Payer: Self-pay

## 2022-06-13 ENCOUNTER — Encounter: Payer: Medicare HMO | Admitting: Physical Medicine and Rehabilitation

## 2022-06-13 DIAGNOSIS — I10 Essential (primary) hypertension: Secondary | ICD-10-CM | POA: Insufficient documentation

## 2022-06-13 DIAGNOSIS — Z7982 Long term (current) use of aspirin: Secondary | ICD-10-CM | POA: Insufficient documentation

## 2022-06-13 DIAGNOSIS — R079 Chest pain, unspecified: Secondary | ICD-10-CM | POA: Diagnosis not present

## 2022-06-13 DIAGNOSIS — R0789 Other chest pain: Secondary | ICD-10-CM | POA: Diagnosis not present

## 2022-06-13 DIAGNOSIS — E119 Type 2 diabetes mellitus without complications: Secondary | ICD-10-CM | POA: Diagnosis not present

## 2022-06-13 DIAGNOSIS — Z941 Heart transplant status: Secondary | ICD-10-CM | POA: Insufficient documentation

## 2022-06-13 DIAGNOSIS — I2510 Atherosclerotic heart disease of native coronary artery without angina pectoris: Secondary | ICD-10-CM | POA: Diagnosis not present

## 2022-06-13 DIAGNOSIS — Z743 Need for continuous supervision: Secondary | ICD-10-CM | POA: Diagnosis not present

## 2022-06-13 DIAGNOSIS — Z79899 Other long term (current) drug therapy: Secondary | ICD-10-CM | POA: Insufficient documentation

## 2022-06-13 DIAGNOSIS — Z96652 Presence of left artificial knee joint: Secondary | ICD-10-CM | POA: Insufficient documentation

## 2022-06-13 DIAGNOSIS — Z794 Long term (current) use of insulin: Secondary | ICD-10-CM | POA: Insufficient documentation

## 2022-06-13 DIAGNOSIS — R072 Precordial pain: Secondary | ICD-10-CM | POA: Diagnosis present

## 2022-06-13 DIAGNOSIS — R110 Nausea: Secondary | ICD-10-CM | POA: Diagnosis not present

## 2022-06-13 LAB — D-DIMER, QUANTITATIVE: D-Dimer, Quant: 0.47 ug/mL-FEU (ref 0.00–0.50)

## 2022-06-13 LAB — BASIC METABOLIC PANEL
Anion gap: 11 (ref 5–15)
BUN: 14 mg/dL (ref 8–23)
CO2: 23 mmol/L (ref 22–32)
Calcium: 8.9 mg/dL (ref 8.9–10.3)
Chloride: 99 mmol/L (ref 98–111)
Creatinine, Ser: 1.17 mg/dL (ref 0.61–1.24)
GFR, Estimated: >60 mL/min (ref >60)
Glucose, Bld: 194 mg/dL — ABNORMAL HIGH (ref 70–99)
Potassium: 4.8 mmol/L (ref 3.5–5.1)
Sodium: 133 mmol/L — ABNORMAL LOW (ref 135–145)

## 2022-06-13 LAB — CBC
HCT: 54.6 % — ABNORMAL HIGH (ref 39.0–52.0)
Hemoglobin: 18.4 g/dL — ABNORMAL HIGH (ref 13.0–17.0)
MCH: 29.6 pg (ref 26.0–34.0)
MCHC: 33.7 g/dL (ref 30.0–36.0)
MCV: 87.8 fL (ref 80.0–100.0)
Platelets: 142 10*3/uL — ABNORMAL LOW (ref 150–400)
RBC: 6.22 MIL/uL — ABNORMAL HIGH (ref 4.22–5.81)
RDW: 13 % (ref 11.5–15.5)
WBC: 7.6 10*3/uL (ref 4.0–10.5)
nRBC: 0 % (ref 0.0–0.2)

## 2022-06-13 LAB — HEPATIC FUNCTION PANEL
ALT: 45 U/L — ABNORMAL HIGH (ref 0–44)
AST: 32 U/L (ref 15–41)
Albumin: 3.5 g/dL (ref 3.5–5.0)
Alkaline Phosphatase: 50 U/L (ref 38–126)
Bilirubin, Direct: 0.2 mg/dL (ref 0.0–0.2)
Indirect Bilirubin: 0.9 mg/dL (ref 0.3–0.9)
Total Bilirubin: 1.1 mg/dL (ref 0.3–1.2)
Total Protein: 6.3 g/dL — ABNORMAL LOW (ref 6.5–8.1)

## 2022-06-13 LAB — TROPONIN I (HIGH SENSITIVITY)
Troponin I (High Sensitivity): 6 ng/L (ref <18)
Troponin I (High Sensitivity): 6 ng/L (ref <18)

## 2022-06-13 LAB — LIPASE, BLOOD: Lipase: 34 U/L (ref 11–51)

## 2022-06-13 LAB — MAGNESIUM: Magnesium: 1.4 mg/dL — ABNORMAL LOW (ref 1.7–2.4)

## 2022-06-13 MED ORDER — ALUM & MAG HYDROXIDE-SIMETH 200-200-20 MG/5ML PO SUSP
30.0000 mL | Freq: Once | ORAL | Status: AC
  Administered 2022-06-13: 30 mL via ORAL
  Filled 2022-06-13: qty 30

## 2022-06-13 MED ORDER — LIDOCAINE VISCOUS HCL 2 % MT SOLN
15.0000 mL | Freq: Once | OROMUCOSAL | Status: AC
  Administered 2022-06-13: 15 mL via ORAL
  Filled 2022-06-13: qty 15

## 2022-06-13 MED ORDER — FENTANYL CITRATE PF 50 MCG/ML IJ SOSY
50.0000 ug | PREFILLED_SYRINGE | INTRAMUSCULAR | Status: DC | PRN
  Administered 2022-06-13: 50 ug via INTRAVENOUS
  Filled 2022-06-13: qty 1

## 2022-06-13 MED ORDER — FAMOTIDINE IN NACL 20-0.9 MG/50ML-% IV SOLN
20.0000 mg | Freq: Once | INTRAVENOUS | Status: AC
  Administered 2022-06-13: 20 mg via INTRAVENOUS
  Filled 2022-06-13: qty 50

## 2022-06-13 MED ORDER — MAGNESIUM SULFATE 2 GM/50ML IV SOLN
2.0000 g | Freq: Once | INTRAVENOUS | Status: AC
  Administered 2022-06-13: 2 g via INTRAVENOUS
  Filled 2022-06-13: qty 50

## 2022-06-13 NOTE — ED Provider Notes (Signed)
[place] Provider Note   CSN: [id number] Arrival date & time: 06/13/22  [id number]     History {Add pertinent medical, surgical, social history, OB history to HPI:1} No chief complaint on file.   John [name] is a 72 y.o. male.  HPI Patient presents for chest pain.  Medical history includes DM, GERD, arthritis, HTN, CAD, sleep apnea, heart transplant.  He is currently on his second heart transplant.  The surgery was done 11 years ago.  His daughter was in his 70s.  Transplant was done at [facility].  He last followed up with them in the office on 2/23.  He was doing well at the time.  Patient states that he was in his normal state of health yesterday and last night.  This morning, he woke at 5 AM with severe substernal chest pain.  Chest pain did not radiate.  It was initially 9/10 in severity.  He had associated nausea.  EMS was called.  Prior to arrival, EMS gave 324 of ASA, 4 mg of Zofran, 4 SL NTG's, and a total of 6 mg of morphine.  Pain improved to 4/10.  Although initially hypertensive, blood pressure improved following prehospital interventions.  Currently, pain has increased slightly to 6/10.  He denies any current nausea.  Patient reports that he has undergone heart cath on his transplanted heart, most recently 2 years ago.  Patient did undergo recent left knee replacement and has had ongoing swelling.  He is not on a blood thinner.    Home Medications Prior to Admission medications   Medication Sig Start Date End Date Taking? Authorizing Provider  ACCU-CHEK FASTCLIX LANCETS MISC Use to check blood sugar 6 times per day dx code E11.9 08/19/14   Elayne Snare, MD  amitriptyline (ELAVIL) 25 MG tablet TAKE 1 TABLET AT BEDTIME FOR 1 WEEK. THEN, CALL DR. Francina Ames OFFICE TO REPORT MEDICATION EFFECTS. IF YOU ARE TOLERATING IT WELL, AFTER 1 WEEK CAN INCREASE TO 2 TABLETS AT BEDTIME 06/09/22   Gertie Gowda, DO  aspirin EC 81 MG tablet Take 81 mg by mouth  daily. Swallow whole.    [provider]  Calcium Carb-Cholecalciferol (CALCIUM 600/VITAMIN D PO) Take 1 tablet by mouth in the morning and at bedtime.    [provider]  citalopram (CELEXA) 20 MG tablet Take 20 mg by mouth at bedtime.    [provider]  glucose blood (FREESTYLE TEST STRIPS) test strip Use as instructed to check blood sugar 6 times per day dx code E11.9 08/19/14   Elayne Snare, MD  insulin aspart (NOVOLOG) 100 UNIT/ML injection Inject 160 Units into the skin See admin instructions. About every 3 days with Omnipod insulin pump    [provider]  Insulin Disposable Pump (OMNIPOD) MISC Use every 3 days 09/18/15   Elayne Snare, MD  lisinopril (PRINIVIL,ZESTRIL) 10 MG tablet Take 10 mg by mouth daily.    [provider]  mycophenolate (CELLCEPT) 500 MG tablet Take 500 mg by mouth 2 (two) times daily.    [provider]  Omega-3 Fatty Acids (FISH OIL) 1200 MG CAPS Take 2,400 mg by mouth 2 (two) times daily.    [provider]  omeprazole (PRILOSEC) 20 MG capsule Take 20 mg by mouth daily.    [provider]  pregabalin (LYRICA) 150 MG capsule Take 1 capsule (150 mg total) by mouth 2 (two) times daily. 04/26/22   Durel Salts C, DO  rosuvastatin ([place])  10 MG tablet Take 10 mg by mouth daily.    [provider]  tacrolimus (PROGRAF) 0.5 MG capsule Take 0.5 mg by mouth 2 (two) times daily.    [provider]      Allergies    Metformin and related    Review of Systems   Review of Systems  Cardiovascular:  Positive for chest pain.  Gastrointestinal:  Positive for nausea.  All other systems reviewed and are negative.   Physical Exam Updated Vital Signs There were no vitals taken for this visit. Physical Exam Vitals and nursing note reviewed.  Constitutional:      General: He is not in acute distress.    Appearance: He is well-developed. He is not ill-appearing, toxic-appearing or  diaphoretic.  HENT:     Head: Normocephalic and atraumatic.  Eyes:     Conjunctiva/sclera: Conjunctivae normal.  Cardiovascular:     Rate and Rhythm: Normal rate and regular rhythm.     Heart sounds: No murmur heard. Pulmonary:     Effort: Pulmonary effort is normal. No respiratory distress.     Breath sounds: Normal breath sounds. No decreased breath sounds, wheezing, rhonchi or rales.  Chest:     Chest wall: No tenderness.  Abdominal:     Palpations: Abdomen is soft.     Tenderness: There is no abdominal tenderness.  Musculoskeletal:        General: No swelling.     Cervical back: Normal range of motion and neck supple.     Right lower leg: No edema.     Left lower leg: No edema.  Skin:    General: Skin is warm and dry.     Capillary Refill: Capillary refill takes less than 2 seconds.     Coloration: Skin is not cyanotic or pale.  Neurological:     General: No focal deficit present.     Mental Status: He is alert and oriented to person, place, and time.  Psychiatric:        Mood and Affect: Mood normal.        Behavior: Behavior normal.     ED Results / Procedures / Treatments   Labs (all labs ordered are listed, but only abnormal results are displayed) Labs Reviewed - No data to display  EKG None  Radiology No results found.  Procedures Procedures  {Document cardiac monitor, telemetry assessment procedure when appropriate:1}  Medications Ordered in ED Medications - No data to display  ED Course/ Medical Decision Making/ A&P   {   Click here for ABCD2, HEART and other calculatorsREFRESH Note before signing :1}                          Medical Decision Making Amount and/or Complexity of Data Reviewed Labs: ordered. Radiology: ordered.  Risk OTC drugs. Prescription drug management.   This patient presents to the ED for concern of ***, this involves an extensive number of treatment options, and is a complaint that carries with it a high risk of  complications and morbidity.  The differential diagnosis includes ***   Co morbidities that complicate the patient evaluation  ***   Additional history obtained:  Additional history obtained from *** External records from outside source obtained and reviewed including ***   Lab Tests:  I Ordered, and personally interpreted labs.  The pertinent results include:  ***   Imaging Studies ordered:  I ordered imaging studies including ***  I independently  visualized and interpreted imaging which showed *** I agree with the radiologist interpretation   Cardiac Monitoring: / EKG:  The patient was maintained on a cardiac monitor.  I personally viewed and interpreted the cardiac monitored which showed an underlying rhythm of: ***   Consultations Obtained:  I requested consultation with the ***,  and discussed lab and imaging findings as well as pertinent plan - they recommend: ***   Problem List / ED Course / Critical interventions / Medication management  Patient with history of heart transplant, presenting for acute onset of severe substernal chest pain.  Onset was 5 AM and it did wake him up from sleep.  He was given ASA, NTG, Zofran, and morphine prior to arrival.  On arrival, patient endorses proved but ongoing chest pain.  He denies current nausea.  He is overall well-appearing on exam.  No cardiac rubs or murmurs are appreciated on auscultation.  His breathing is unlabored.  Vital signs are normal.  EKG shows precordial T wave abnormalities which have been present on prior EKGs.  Fentanyl was ordered for analgesia.  Laboratory workup was initiated.***. I ordered medication including ***  for ***  Reevaluation of the patient after these medicines showed that the patient {resolved/improved/worsened:23923::"improved"} I have reviewed the patients home medicines and have made adjustments as needed   Social Determinants of Health:  ***   Test / Admission -  Considered:  ***   {Document critical care time when appropriate:1} {Document review of labs and clinical decision tools ie heart score, Chads2Vasc2 etc:1}  {Document your independent review of radiology images, and any outside records:1} {Document your discussion with family members, caretakers, and with consultants:1} {Document social determinants of health affecting pt's care:1} {Document your decision making why or why not admission, treatments were needed:1} Final Clinical Impression(s) / ED Diagnoses Final diagnoses:  None    Rx / DC Orders ED Discharge Orders     None

## 2022-06-13 NOTE — ED Triage Notes (Signed)
Pt arrived via [facility] from home for c/o non radiating midsternal chest pain, nausea, dry heaves that woke him up at 0500 this morning. Initial chest pain 10/10 aching. EMS gave ASA 325 mg, nitrox4, zofran '4mg'$  IV, morphine total '6mg'$  IV. Per EMS initial bp 210/100. Per EMS, pt has inverted T waves on ECG. Pt is A&Ox4. Chest pain now 4/10.

## 2022-06-13 NOTE — Progress Notes (Deleted)
Subjective:    Patient ID: John [name], male    DOB: 09-May-1950, 72 y.o.   MRN: [id number]  HPI   Pain Inventory Average Pain {NUMBERS; 0-10:5044} Pain Right Now {NUMBERS; 0-10:5044} My pain is {PAIN DESCRIPTION:21022940}  In the last 24 hours, has pain interfered with the following? General activity {NUMBERS; 0-10:5044} Relation with others {NUMBERS; 0-10:5044} Enjoyment of life {NUMBERS; 0-10:5044} What TIME of day is your pain at its worst? {time of day:24191} Sleep (in general) {BHH GOOD/FAIR/POOR:22877}  Pain is worse with: {ACTIVITIES:21022942} Pain improves with: {PAIN IMPROVES BW:[id number] Relief from Meds: {NUMBERS; 0-10:5044}  Family History  Problem Relation Age of Onset   Heart disease Mother    Heart disease Father    Diabetes Brother    Cancer Brother    Cancer Niece    Cancer Niece    Social History   Socioeconomic History   Marital status: Married    Spouse name: Not on file   Number of children: Not on file   Years of education: Not on file   Highest education level: Not on file  Occupational History   Not on file  Tobacco Use   Smoking status: Former    Packs/day: 1.00    Years: 40.00    Total pack years: 40.00    Types: Cigarettes    Quit date: 1998    Years since quitting: 26.2   Smokeless tobacco: Never   Tobacco comments:    quit smoking cigarettes in 1998  Vaping Use   Vaping Use: Never used  Substance and Sexual Activity   Alcohol use: Yes    Comment: rare   Drug use: No   Sexual activity: Not on file  Other Topics Concern   Not on file  Social History Narrative   Not on file   Social Determinants of Health   Financial Resource Strain: Not on file  Food Insecurity: No Food Insecurity (02/15/2022)   Hunger Vital Sign    Worried About Running Out of Food in the Last Year: Never true    Ran Out of Food in the Last Year: Never true  Transportation Needs: No Transportation Needs (02/15/2022)   PRAPARE - Armed forces logistics/support/administrative officer (Medical): No    Lack of Transportation (Non-Medical): No  Physical Activity: Not on file  Stress: Not on file  Social Connections: Not on file   Past Surgical History:  Procedure Laterality Date   APPENDECTOMY     BIOPSY  03/24/2021   Procedure: BIOPSY;  Surgeon: Wilford Corner, MD;  [facility];  Service: Endoscopy;;   CARDIAC CATHETERIZATION     Novemer 2014   COLONOSCOPY     COLONOSCOPY WITH PROPOFOL N/A 06/03/2013   Procedure: COLONOSCOPY WITH PROPOFOL;  Surgeon: Lear Ng, MD;  Location: [place];  Service: Endoscopy;  Laterality: N/A;   COLONOSCOPY WITH PROPOFOL N/A 03/24/2021   Procedure: COLONOSCOPY WITH PROPOFOL;  Surgeon: Wilford Corner, MD;  [facility];  Service: Endoscopy;  Laterality: N/A;   ESOPHAGOGASTRODUODENOSCOPY (EGD) WITH PROPOFOL N/A 06/03/2013   Procedure: ESOPHAGOGASTRODUODENOSCOPY (EGD) WITH PROPOFOL;  Surgeon: Lear Ng, MD;  Location: [place];  Service: Endoscopy;  Laterality: N/A;   HEART TRANSPLANT     Hx; of x 2, 2000 and 2013   INGUINAL HERNIA REPAIR Right 08/24/2016   Procedure: LAPAROSCOPIC  RIGHT INGUINAL HERNIA WITH MESH;  Surgeon: Ralene Ok, MD;  Location: [place];  Service: General;  Laterality: Right;   PARTIAL KNEE  ARTHROPLASTY Left 02/15/2022   Procedure: UNICOMPARTMENTAL KNEE;  Surgeon: Marchia Bond, MD;  [facility];  Service: Orthopedics;  Laterality: Left;   PROSTATECTOMY     Past Surgical History:  Procedure Laterality Date   APPENDECTOMY     BIOPSY  03/24/2021   Procedure: BIOPSY;  Surgeon: Wilford Corner, MD;  [facility];  Service: Endoscopy;;   CARDIAC CATHETERIZATION     Novemer 2014   COLONOSCOPY     COLONOSCOPY WITH PROPOFOL N/A 06/03/2013   Procedure: COLONOSCOPY WITH PROPOFOL;  Surgeon: Lear Ng, MD;  Location: [place];  Service: Endoscopy;  Laterality: N/A;   COLONOSCOPY WITH PROPOFOL N/A 03/24/2021    Procedure: COLONOSCOPY WITH PROPOFOL;  Surgeon: Wilford Corner, MD;  [facility];  Service: Endoscopy;  Laterality: N/A;   ESOPHAGOGASTRODUODENOSCOPY (EGD) WITH PROPOFOL N/A 06/03/2013   Procedure: ESOPHAGOGASTRODUODENOSCOPY (EGD) WITH PROPOFOL;  Surgeon: Lear Ng, MD;  Location: [place];  Service: Endoscopy;  Laterality: N/A;   HEART TRANSPLANT     Hx; of x 2, 2000 and 2013   INGUINAL HERNIA REPAIR Right 08/24/2016   Procedure: LAPAROSCOPIC  RIGHT INGUINAL HERNIA WITH MESH;  Surgeon: Ralene Ok, MD;  Location: [place];  Service: General;  Laterality: Right;   PARTIAL KNEE ARTHROPLASTY Left 02/15/2022   Procedure: UNICOMPARTMENTAL KNEE;  Surgeon: Marchia Bond, MD;  [facility];  Service: Orthopedics;  Laterality: Left;   PROSTATECTOMY     Past Medical History:  Diagnosis Date   Arthritis    Cancer ([place])    prostate   Complication of anesthesia    Problems with waking up and with intubation   Diabetes mellitus without complication ([place])    GERD (gastroesophageal reflux disease)    Hypertension    Myocardial infarction ([place])    Sleep apnea    cpap   There were no vitals taken for this visit.  Opioid Risk Score:   Fall Risk Score:  `1  Depression screen [facility] 2/9     04/13/2022    9:27 AM  Depression screen PHQ 2/9  Decreased Interest 0  Down, Depressed, Hopeless 0  PHQ - 2 Score 0  Altered sleeping 1  Tired, decreased energy 0  Change in appetite 0  Feeling bad or failure about yourself  0  Trouble concentrating 0  Moving slowly or fidgety/restless 0  PHQ-9 Score 1  Difficult doing work/chores Not difficult at all    Review of Systems     Objective:   Physical Exam        Assessment & Plan:

## 2022-06-13 NOTE — Discharge Instructions (Addendum)
Your lab work was reassuring.  Make sure that you reach out to your heart doctors to set up follow-up appointments.  Continue to take your omeprazole.  You can also take Maalox over-the-counter as needed.  Dr. Perley Jain number is below to call for additional follow-up.  Return to the emergency department at anytime for any new or worsening symptoms of concern.

## 2022-06-14 NOTE — Progress Notes (Signed)
Subjective:    Patient ID: John [name], male    DOB: 1951/04/04, 72 y.o.   MRN: [id number]  HPI   John [name] is a 72 y.o. year old male  who  has a past medical history of Arthritis, Cancer ([place]), Complication of anesthesia, Diabetes mellitus without complication ([place]), GERD (gastroesophageal reflux disease), Hypertension, Myocardial infarction [facility]), and Sleep apnea.   They are presenting to PM&R clinic for follow up related to L leg neuropathic paoin .  Plan from last visit: Left medial knee pain Assessment & Plan: Pain is described as burning sensitivity consistent with neuropathic etiology, localized to medial knee prior to surgery but extending distally and proximally along medial leg immediately post-op.    Differential includes L3 radiculopathy vs. peripheral nerve injury s/p knee replacement (possible femoral nerve);  less likely CRPS, diabetic/immunosuppression related neuropathy; low index of suspicion for vascular etiology/DVT.    - NSAIDs relatively contraindicated due to Hx multiple heart transplants - Given neuropathic etiology, no obvious role for narcotic medications at this time - Referral paperwork queried [facility]; only approved indication is for diabetic neuropathy, which this patient's presentation is not currently consistent with. May reconsider if no improvement with alternative treatments.    Orders: -     Comprehensive metabolic panel   S/P left unicompartmental knee replacement Assessment & Plan: - Patient concerned regarding potential peripheral nerve injury from intraoperative turnicate use around proximal thigh; possible etiology given distributions consistent with anterior femoral cutaneous nerve and saphenous nerve, which could indicate a femoral nerve injury.   - Patient did complete post-op PT and has good strength, ROM in LE; no need to repeat therapies at this time.    - Given improvement with passive compression locally, recommended adjustable  compression brace OTC/on [employer] to provide passive pressure     Orders: -     Comprehensive metabolic panel   Neuropathic pain Assessment & Plan: Primary differential includes left femoral neuropathy vs. L3 radiculopathy   - Will schedule EMG LLE to evaluate for pathology, given patient is >6 week post-op with no improved symptoms   - Try capsaicin cream OTC locally over sensitive areas TID; if beneficial, may move to prescription strength cream   - Labs ordered today; if appropriate, will prescribe Amitriptyline 25 mg QHS for neuropathic pain. Patient has tried Lyrica and Gabapentin without benefit.    - Will review findings and medications at EMG visit; if no improvement or suspicion for radiculopathy based on results, will pursue imaging of low back.    Orders: -     Comprehensive metabolic panel   Immunosuppression due to drug therapy ([place]) -     Comprehensive metabolic panel   Insomnia, unspecified type Assessment & Plan: - Worsened by medial knee pain, sensitivity - Hopeful combination of nighttime amitriptyline and compression can improve tolerance of sensation of clothing/bed sheets and improve sleep     Interval Hx:  - Therapies: Stabbing pain in his medial patella with hyperextension that causes buckling. No falls. No DME, is able to catch himself. Not worsened with walking uphill or stairs; just with hyperextension.    - Follow ups: hasn't followed up with surgeon in awhile   - Medications: Stopped amytriptilline because it wasn't making a difference. Did miss a dose of pregabalin and noticed increased sensitivity with that.    - Other concerns: Overall symptoms much improved; if tolerating pants and sheets on the leg. Medial L knee is sensitive and distal ankle is still  nub.    Recent ER visit for chest pain; had just had an eval at [facility] and was given a clean bill of health.   Pain Inventory Average Pain 3 Pain Right Now 0 My pain is intermittent, sharp, and  stabbing  In the last 24 hours, has pain interfered with the following? General activity 0 Relation with others 0 Enjoyment of life 0 What TIME of day is your pain at its worst? varies Sleep (in general) Good  Pain is worse with: some activites and movement Pain improves with:  It just goes away Relief from Meds:  maybe some relief with  Pregabalin  Family History  Problem Relation Age of Onset   Heart disease Mother    Heart disease Father    Diabetes Brother    Cancer Brother    Cancer Niece    Cancer Niece    Social History   Socioeconomic History   Marital status: Married    Spouse name: Not on file   Number of children: Not on file   Years of education: Not on file   Highest education level: Not on file  Occupational History   Not on file  Tobacco Use   Smoking status: Former    Packs/day: 1.00    Years: 40.00    Total pack years: 40.00    Types: Cigarettes    Quit date: 69    Years since quitting: 26.2   Smokeless tobacco: Never   Tobacco comments:    quit smoking cigarettes in 1998  Vaping Use   Vaping Use: Never used  Substance and Sexual Activity   Alcohol use: Yes    Comment: rare   Drug use: No   Sexual activity: Not on file  Other Topics Concern   Not on file  Social History Narrative   Not on file   Social Determinants of Health   Financial Resource Strain: Not on file  Food Insecurity: No Food Insecurity (02/15/2022)   Hunger Vital Sign    Worried About Running Out of Food in the Last Year: Never true    Ran Out of Food in the Last Year: Never true  Transportation Needs: No Transportation Needs (02/15/2022)   PRAPARE - Hydrologist (Medical): No    Lack of Transportation (Non-Medical): No  Physical Activity: Not on file  Stress: Not on file  Social Connections: Not on file   Past Surgical History:  Procedure Laterality Date   APPENDECTOMY     BIOPSY  03/24/2021   Procedure: BIOPSY;  Surgeon:  Wilford Corner, MD;  Location: [facility];  Service: Endoscopy;;   CARDIAC CATHETERIZATION     Novemer 2014   COLONOSCOPY     COLONOSCOPY WITH PROPOFOL N/A 06/03/2013   Procedure: COLONOSCOPY WITH PROPOFOL;  Surgeon: Lear Ng, MD;  Location: [place];  Service: Endoscopy;  Laterality: N/A;   COLONOSCOPY WITH PROPOFOL N/A 03/24/2021   Procedure: COLONOSCOPY WITH PROPOFOL;  Surgeon: Wilford Corner, MD;  [facility];  Service: Endoscopy;  Laterality: N/A;   ESOPHAGOGASTRODUODENOSCOPY (EGD) WITH PROPOFOL N/A 06/03/2013   Procedure: ESOPHAGOGASTRODUODENOSCOPY (EGD) WITH PROPOFOL;  Surgeon: Lear Ng, MD;  Location: [place];  Service: Endoscopy;  Laterality: N/A;   HEART TRANSPLANT     Hx; of x 2, 2000 and 2013   INGUINAL HERNIA REPAIR Right 08/24/2016   Procedure: LAPAROSCOPIC  RIGHT INGUINAL HERNIA WITH MESH;  Surgeon: Ralene Ok, MD;  Location: [place];  Service: General;  Laterality: Right;   PARTIAL KNEE ARTHROPLASTY Left 02/15/2022   Procedure: UNICOMPARTMENTAL KNEE;  Surgeon: Marchia Bond, MD;  Location: [facility];  Service: Orthopedics;  Laterality: Left;   PROSTATECTOMY     Past Surgical History:  Procedure Laterality Date   APPENDECTOMY     BIOPSY  03/24/2021   Procedure: BIOPSY;  Surgeon: Wilford Corner, MD;  [facility];  Service: Endoscopy;;   CARDIAC CATHETERIZATION     Novemer 2014   COLONOSCOPY     COLONOSCOPY WITH PROPOFOL N/A 06/03/2013   Procedure: COLONOSCOPY WITH PROPOFOL;  Surgeon: Lear Ng, MD;  Location: [place];  Service: Endoscopy;  Laterality: N/A;   COLONOSCOPY WITH PROPOFOL N/A 03/24/2021   Procedure: COLONOSCOPY WITH PROPOFOL;  Surgeon: Wilford Corner, MD;  [facility];  Service: Endoscopy;  Laterality: N/A;   ESOPHAGOGASTRODUODENOSCOPY (EGD) WITH PROPOFOL N/A 06/03/2013   Procedure: ESOPHAGOGASTRODUODENOSCOPY (EGD) WITH PROPOFOL;  Surgeon: Lear Ng, MD;   Location: [place];  Service: Endoscopy;  Laterality: N/A;   HEART TRANSPLANT     Hx; of x 2, 2000 and 2013   INGUINAL HERNIA REPAIR Right 08/24/2016   Procedure: LAPAROSCOPIC  RIGHT INGUINAL HERNIA WITH MESH;  Surgeon: Ralene Ok, MD;  Location: [place];  Service: General;  Laterality: Right;   PARTIAL KNEE ARTHROPLASTY Left 02/15/2022   Procedure: UNICOMPARTMENTAL KNEE;  Surgeon: Marchia Bond, MD;  [facility];  Service: Orthopedics;  Laterality: Left;   PROSTATECTOMY     Past Medical History:  Diagnosis Date   Arthritis    Cancer ([place])    prostate   Complication of anesthesia    Problems with waking up and with intubation   Diabetes mellitus without complication ([name])    GERD (gastroesophageal reflux disease)    Hypertension    Myocardial infarction ([place])    Sleep apnea    cpap   There were no vitals taken for this visit.  Opioid Risk Score:   Fall Risk Score:  `1  Depression screen [facility] 2/9     04/13/2022    9:27 AM  Depression screen PHQ 2/9  Decreased Interest 0  Down, Depressed, Hopeless 0  PHQ - 2 Score 0  Altered sleeping 1  Tired, decreased energy 0  Change in appetite 0  Feeling bad or failure about yourself  0  Trouble concentrating 0  Moving slowly or fidgety/restless 0  PHQ-9 Score 1  Difficult doing work/chores Not difficult at all    Review of Systems  Musculoskeletal:        Left knee pain  All other systems reviewed and are negative.      Objective:   Physical Exam   PE: Constitution: Appropriate appearance for age. No apparent distress  Resp: No respiratory distress. No accessory muscle usage. on RA Cardio: Well perfused appearance.  No peripheral edema. Abdomen: Nondistended. Nontender.   Psych: Appropriate mood and affect. Neuro: AAOx4. No apparent cognitive deficits   Neurologic Exam:   DTRs: Reflexes were 2+ in bilateral achilles, patella, biceps, BR and triceps. Babinsky: flexor responses b/l.   Hoffmans:  negative b/l Sensory exam: revealed normal sensation in all dermatomal regions in bilateral lower extremities, with ongoing hypersensitivity at L medial knee Motor exam: strength 5/5 throughout bilateral lower extremities Coordination: Fine motor coordination was normal.   Gait: normal   Knee Exam: R Inspection: No gross deformities seen.  No effusion present.  No erythema present.  No evidence of Genu Valgum/Varum or Recurvatum.   Palpation: No increased  warmth around joint. Notable tenderness at Medial Joint line. No crepitus present.    ROM:  Flexion  (135*), Extension  (0*).  No hyperextension noted, but medial patella stabbing pain with extension .      Assessment & Plan:    John [name] is a 72 y.o. year old male  who  has a past medical history of Arthritis, Cancer ([place]), Complication of anesthesia, Diabetes mellitus without complication ([place]), GERD (gastroesophageal reflux disease), Hypertension, Myocardial infarction [facility]), and Sleep apnea.   They are presenting to PM&R clinic for follow up related to L leg neuropathic pain .   Neuropathic pain Assessment & Plan:  I have prescribed a taper of Lyrica for you and sent it to [facility]. When ready to start taper, take morning dose to 1 capsule and evening dose to 2 capsules for 3 days. Then, reduce to 1 capsule twice daily for 3 days. Then only 1 capsule at night for 3-5 days. Then stop.   If you are unable to tolerate the taper or wish to restart the medication, give our office a call and I will send you up to 3 months of new prescriptions at a time.    If pain and sensitivity in your left leg do not continue to improve once you are back in [place], feel free to make an appointment with me to review your medication management and further options.    Left medial knee pain Assessment & Plan: Follow-up with your surgeon regarding buckling sensation when hyperextending your left knee.   Other orders -      Pregabalin; When ready to start taper, take morning dose to 1 capsule and evening dose to 2 capsules for 3 days. Then, reduce to 1 capsule twice daily for 3 days. Then only 1 capsule at night for 3-5 days. Then stop.  Dispense: 30 capsule; Refill: 0

## 2022-06-15 ENCOUNTER — Telehealth: Payer: Self-pay | Admitting: Nutrition

## 2022-06-15 ENCOUNTER — Encounter: Payer: Medicare HMO | Attending: Physical Medicine and Rehabilitation | Admitting: Physical Medicine and Rehabilitation

## 2022-06-15 ENCOUNTER — Encounter: Payer: Self-pay | Admitting: Physical Medicine and Rehabilitation

## 2022-06-15 VITALS — BP 146/87 | HR 86 | Ht 65.0 in | Wt 175.0 lb

## 2022-06-15 DIAGNOSIS — M25562 Pain in left knee: Secondary | ICD-10-CM | POA: Diagnosis not present

## 2022-06-15 DIAGNOSIS — M792 Neuralgia and neuritis, unspecified: Secondary | ICD-10-CM | POA: Diagnosis not present

## 2022-06-15 MED ORDER — PREGABALIN 75 MG PO CAPS: ORAL_CAPSULE | ORAL | 0 refills | Status: AC

## 2022-06-15 NOTE — Patient Instructions (Signed)
Follow-up with your surgeon regarding buckling sensation when hyperextending your left knee.  I have prescribed a taper of Lyrica for you and sent it to [facility]. When ready to start taper, take morning dose to 1 capsule and evening dose to 2 capsules for 3 days. Then, reduce to 1 capsule twice daily for 3 days. Then only 1 capsule at night for 3-5 days. Then stop.   If you are unable to tolerate the taper or wish to restart the medication, give our office a call and I will send you up to 3 months of new prescriptions at a time.  If pain and sensitivity in your left leg do not continue to improve once you are back in [place], feel free to make an appointment with me to review your medication management and further options.  Have a great summer!

## 2022-06-15 NOTE — Telephone Encounter (Signed)
Patient requested an appointment saying his blood sugars are running higher than he likes.  Appointment scheduled for next week.

## 2022-06-20 NOTE — Assessment & Plan Note (Signed)
  I have prescribed a taper of Lyrica for you and sent it to [facility]. When ready to start taper, take morning dose to 1 capsule and evening dose to 2 capsules for 3 days. Then, reduce to 1 capsule twice daily for 3 days. Then only 1 capsule at night for 3-5 days. Then stop.   If you are unable to tolerate the taper or wish to restart the medication, give our office a call and I will send you up to 3 months of new prescriptions at a time.    If pain and sensitivity in your left leg do not continue to improve once you are back in [place], feel free to make an appointment with me to review your medication management and further options.

## 2022-06-20 NOTE — Assessment & Plan Note (Signed)
Follow-up with your surgeon regarding buckling sensation when hyperextending your left knee.

## 2022-06-22 ENCOUNTER — Encounter: Payer: Medicare HMO | Attending: Family Medicine | Admitting: Nutrition

## 2022-06-22 DIAGNOSIS — Z794 Long term (current) use of insulin: Secondary | ICD-10-CM | POA: Insufficient documentation

## 2022-06-22 DIAGNOSIS — E119 Type 2 diabetes mellitus without complications: Secondary | ICD-10-CM | POA: Diagnosis not present

## 2022-06-22 NOTE — Progress Notes (Signed)
Patient is on Omnipod 5 with dexcom sensors.  Dexcom time in range is just 43%.  He reports that blood sugars are going high after meals and not coming down before the next meal.  Says diet has not changed, and having protein with each meal , and limiting fats to only 1-2 servings at each meal.  No low blood sugars.   Basal rate:MN: 2.5, 6AM: 2.0, 10AM: 1.8, 3PM: 1.55, 6PM: 2.3u/hr.  ISF: 35, duration: 3 hours, I/C: MN 8, 7:30AM: 7, 9AM: 8.    Plan: I/C droped by 1, Pt. Is consuming 55 carbs per meal,and this will give him 1.1 more units at each meal.  He was told that if this did not bring his readings down to target in 4 hours, to decrease ISF from 35 to 30.  He agreed to do this and had no final questions.

## 2022-06-22 NOTE — Patient Instructions (Signed)
Reduce carb ratio factors by 1:  MN: 7, 7:30: 6, 9AM: 7.  If blood sugars do not come down to premeal readings, reduce by 0.5 more.

## 2022-06-27 DIAGNOSIS — M1712 Unilateral primary osteoarthritis, left knee: Secondary | ICD-10-CM | POA: Diagnosis not present

## 2022-06-27 IMAGING — PT NM PET TUM IMG SKULL BASE T - THIGH
1 series · 3 of 3 positions shown · non-contrast
Comparison: PSMA PET scan 03/09/2021

CLINICAL DATA: Prostate carcinoma with biochemical recurrence. PSA
equal 3.23. Prior prostatectomy and radiation treatment.

EXAM:
NUCLEAR MEDICINE PET SKULL BASE TO THIGH
TECHNIQUE: 9.78 mCi F18 Piflufolastat (Pylarify) was injected intravenously.
Full-ring PET imaging was performed from the skull base to thigh
after the radiotracer. CT data was obtained and used for attenuation
correction and anatomic localization.

[Series 1263: results mm oncology reading · 1.0mm · 0.63mm/px · 3 of 3 slices shown]
[im 1/3]
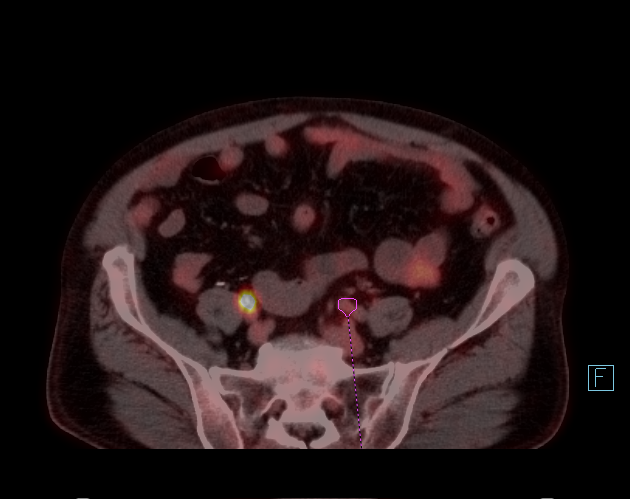
[im 2/3]
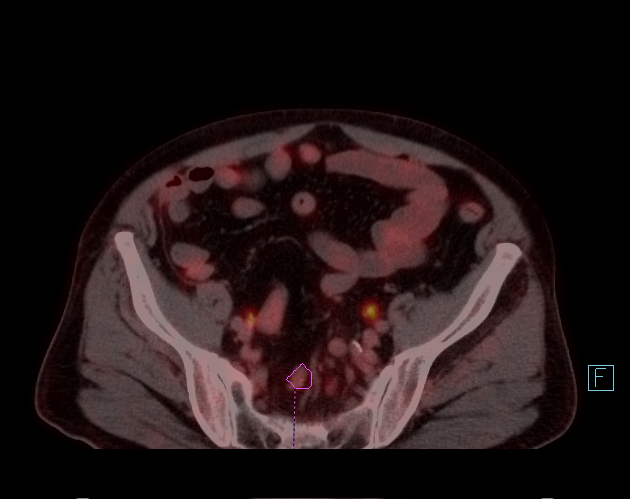
[im 3/3]
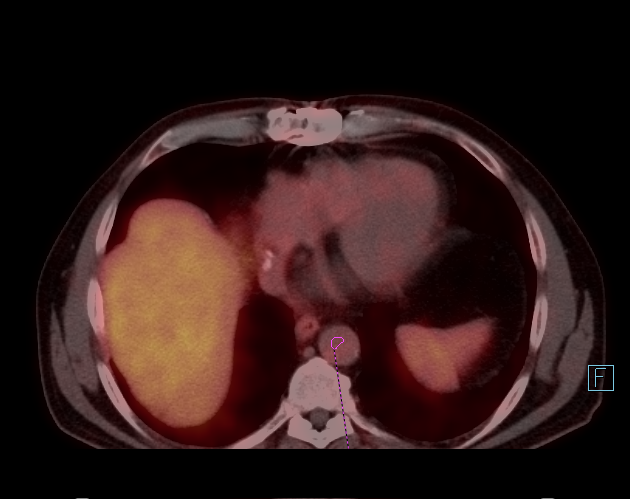

[3 of 3 positions shown; findings below may reference images not displayed]

FINDINGS: NECK

No radiotracer activity in neck lymph nodes.

Incidental CT finding: None

CHEST

No radiotracer accumulation within mediastinal or hilar lymph nodes.
No suspicious pulmonary nodules on the CT scan.

Incidental CT finding: None

ABDOMEN/PELVIS

Prostate: No focal activity in prostatectomy bed.

Lymph nodes: Again demonstrated small presacral lymph nodes
clustered on image 185. These nodes have very low radiotracer
activity (SUV max 1.9).

Similarly enlarged LEFT common iliac lymph node measuring 8 mm
(image 2241/4) has very low radiotracer activity with SUV max equal
2.0. These nodes size activity is similar comparison

Liver: No evidence of liver metastasis

Incidental CT finding: None

SKELETON

No focal  activity to suggest skeletal metastasis.
IMPRESSION: 1. No evidence local prostate cancer recurrence in the prostatectomy
bed.
2. No evidence metastatic prostate cancer adenopathy in the pelvis
or periaortic retroperitoneum.
3. No evidence of visceral metastasis or skeletal metastasis.
4. Again noted minimally enlarged presacral and LEFT iliac lymph
nodes which do not have significant radiotracer activity and favored
unrelated to prostate cancer.

## 2022-06-29 DIAGNOSIS — G4733 Obstructive sleep apnea (adult) (pediatric): Secondary | ICD-10-CM | POA: Diagnosis not present

## 2022-07-04 DIAGNOSIS — Z85828 Personal history of other malignant neoplasm of skin: Secondary | ICD-10-CM | POA: Diagnosis not present

## 2022-07-04 DIAGNOSIS — H6123 Impacted cerumen, bilateral: Secondary | ICD-10-CM | POA: Diagnosis not present

## 2022-07-04 DIAGNOSIS — L820 Inflamed seborrheic keratosis: Secondary | ICD-10-CM | POA: Diagnosis not present

## 2022-07-04 DIAGNOSIS — Z6827 Body mass index (BMI) 27.0-27.9, adult: Secondary | ICD-10-CM | POA: Diagnosis not present

## 2022-07-04 DIAGNOSIS — D044 Carcinoma in situ of skin of scalp and neck: Secondary | ICD-10-CM | POA: Diagnosis not present

## 2022-07-04 DIAGNOSIS — L821 Other seborrheic keratosis: Secondary | ICD-10-CM | POA: Diagnosis not present

## 2022-07-04 DIAGNOSIS — L570 Actinic keratosis: Secondary | ICD-10-CM | POA: Diagnosis not present

## 2022-07-08 DIAGNOSIS — Z8546 Personal history of malignant neoplasm of prostate: Secondary | ICD-10-CM | POA: Diagnosis not present

## 2022-07-11 DIAGNOSIS — E118 Type 2 diabetes mellitus with unspecified complications: Secondary | ICD-10-CM | POA: Diagnosis not present

## 2022-07-11 DIAGNOSIS — E119 Type 2 diabetes mellitus without complications: Secondary | ICD-10-CM | POA: Diagnosis not present

## 2022-07-11 DIAGNOSIS — E11319 Type 2 diabetes mellitus with unspecified diabetic retinopathy without macular edema: Secondary | ICD-10-CM | POA: Diagnosis not present

## 2022-07-11 DIAGNOSIS — E1169 Type 2 diabetes mellitus with other specified complication: Secondary | ICD-10-CM | POA: Diagnosis not present

## 2022-07-11 DIAGNOSIS — Z794 Long term (current) use of insulin: Secondary | ICD-10-CM | POA: Diagnosis not present

## 2022-07-28 DIAGNOSIS — R9721 Rising PSA following treatment for malignant neoplasm of prostate: Secondary | ICD-10-CM | POA: Diagnosis not present

## 2022-07-30 DIAGNOSIS — G4733 Obstructive sleep apnea (adult) (pediatric): Secondary | ICD-10-CM | POA: Diagnosis not present

## 2022-08-01 DIAGNOSIS — D691 Qualitative platelet defects: Secondary | ICD-10-CM | POA: Diagnosis not present

## 2022-08-01 DIAGNOSIS — N182 Chronic kidney disease, stage 2 (mild): Secondary | ICD-10-CM | POA: Diagnosis not present

## 2022-08-01 DIAGNOSIS — Z0000 Encounter for general adult medical examination without abnormal findings: Secondary | ICD-10-CM | POA: Diagnosis not present

## 2022-08-01 DIAGNOSIS — Z941 Heart transplant status: Secondary | ICD-10-CM | POA: Diagnosis not present

## 2022-08-01 DIAGNOSIS — E1122 Type 2 diabetes mellitus with diabetic chronic kidney disease: Secondary | ICD-10-CM | POA: Diagnosis not present

## 2022-08-01 DIAGNOSIS — F3342 Major depressive disorder, recurrent, in full remission: Secondary | ICD-10-CM | POA: Diagnosis not present

## 2022-08-01 DIAGNOSIS — G4733 Obstructive sleep apnea (adult) (pediatric): Secondary | ICD-10-CM | POA: Diagnosis not present

## 2022-08-01 DIAGNOSIS — D751 Secondary polycythemia: Secondary | ICD-10-CM | POA: Diagnosis not present

## 2022-08-01 DIAGNOSIS — D692 Other nonthrombocytopenic purpura: Secondary | ICD-10-CM | POA: Diagnosis not present

## 2022-08-01 DIAGNOSIS — Z794 Long term (current) use of insulin: Secondary | ICD-10-CM | POA: Diagnosis not present

## 2022-08-01 DIAGNOSIS — Z9181 History of falling: Secondary | ICD-10-CM | POA: Diagnosis not present

## 2022-08-01 DIAGNOSIS — I10 Essential (primary) hypertension: Secondary | ICD-10-CM | POA: Diagnosis not present

## 2022-10-04 DIAGNOSIS — E1169 Type 2 diabetes mellitus with other specified complication: Secondary | ICD-10-CM | POA: Diagnosis not present

## 2022-10-04 DIAGNOSIS — E119 Type 2 diabetes mellitus without complications: Secondary | ICD-10-CM | POA: Diagnosis not present

## 2022-10-04 DIAGNOSIS — E11319 Type 2 diabetes mellitus with unspecified diabetic retinopathy without macular edema: Secondary | ICD-10-CM | POA: Diagnosis not present

## 2022-10-04 DIAGNOSIS — E118 Type 2 diabetes mellitus with unspecified complications: Secondary | ICD-10-CM | POA: Diagnosis not present

## 2022-10-04 DIAGNOSIS — Z794 Long term (current) use of insulin: Secondary | ICD-10-CM | POA: Diagnosis not present

## 2022-11-11 DIAGNOSIS — G4733 Obstructive sleep apnea (adult) (pediatric): Secondary | ICD-10-CM | POA: Diagnosis not present

## 2022-12-02 DIAGNOSIS — E11319 Type 2 diabetes mellitus with unspecified diabetic retinopathy without macular edema: Secondary | ICD-10-CM | POA: Diagnosis not present

## 2022-12-02 DIAGNOSIS — Z794 Long term (current) use of insulin: Secondary | ICD-10-CM | POA: Diagnosis not present

## 2022-12-02 DIAGNOSIS — E118 Type 2 diabetes mellitus with unspecified complications: Secondary | ICD-10-CM | POA: Diagnosis not present

## 2022-12-02 DIAGNOSIS — E119 Type 2 diabetes mellitus without complications: Secondary | ICD-10-CM | POA: Diagnosis not present

## 2022-12-02 DIAGNOSIS — E1169 Type 2 diabetes mellitus with other specified complication: Secondary | ICD-10-CM | POA: Diagnosis not present

## 2022-12-12 DIAGNOSIS — G4733 Obstructive sleep apnea (adult) (pediatric): Secondary | ICD-10-CM | POA: Diagnosis not present

## 2023-01-11 DIAGNOSIS — G4733 Obstructive sleep apnea (adult) (pediatric): Secondary | ICD-10-CM | POA: Diagnosis not present

## 2023-02-09 DIAGNOSIS — L814 Other melanin hyperpigmentation: Secondary | ICD-10-CM | POA: Diagnosis not present

## 2023-02-09 DIAGNOSIS — D1801 Hemangioma of skin and subcutaneous tissue: Secondary | ICD-10-CM | POA: Diagnosis not present

## 2023-02-09 DIAGNOSIS — D225 Melanocytic nevi of trunk: Secondary | ICD-10-CM | POA: Diagnosis not present

## 2023-02-09 DIAGNOSIS — L570 Actinic keratosis: Secondary | ICD-10-CM | POA: Diagnosis not present

## 2023-02-09 DIAGNOSIS — D692 Other nonthrombocytopenic purpura: Secondary | ICD-10-CM | POA: Diagnosis not present

## 2023-02-09 DIAGNOSIS — L821 Other seborrheic keratosis: Secondary | ICD-10-CM | POA: Diagnosis not present

## 2023-02-09 DIAGNOSIS — Z85828 Personal history of other malignant neoplasm of skin: Secondary | ICD-10-CM | POA: Diagnosis not present

## 2023-02-14 DIAGNOSIS — G4733 Obstructive sleep apnea (adult) (pediatric): Secondary | ICD-10-CM | POA: Diagnosis not present

## 2023-03-01 ENCOUNTER — Other Ambulatory Visit (HOSPITAL_COMMUNITY): Payer: Self-pay | Admitting: Urology

## 2023-03-01 DIAGNOSIS — R9721 Rising PSA following treatment for malignant neoplasm of prostate: Secondary | ICD-10-CM

## 2023-03-08 DIAGNOSIS — E119 Type 2 diabetes mellitus without complications: Secondary | ICD-10-CM | POA: Diagnosis not present

## 2023-03-08 DIAGNOSIS — E1169 Type 2 diabetes mellitus with other specified complication: Secondary | ICD-10-CM | POA: Diagnosis not present

## 2023-03-08 DIAGNOSIS — E118 Type 2 diabetes mellitus with unspecified complications: Secondary | ICD-10-CM | POA: Diagnosis not present

## 2023-03-08 DIAGNOSIS — Z794 Long term (current) use of insulin: Secondary | ICD-10-CM | POA: Diagnosis not present

## 2023-03-08 DIAGNOSIS — E11319 Type 2 diabetes mellitus with unspecified diabetic retinopathy without macular edema: Secondary | ICD-10-CM | POA: Diagnosis not present

## 2023-03-13 ENCOUNTER — Encounter (HOSPITAL_COMMUNITY)
Admission: RE | Admit: 2023-03-13 | Discharge: 2023-03-13 | Disposition: A | Discharge status: Home / Self Care | Payer: Medicare HMO | Source: Ambulatory Visit | Attending: Urology | Admitting: Urology

## 2023-03-13 DIAGNOSIS — R9721 Rising PSA following treatment for malignant neoplasm of prostate: Secondary | ICD-10-CM | POA: Insufficient documentation

## 2023-03-13 DIAGNOSIS — C61 Malignant neoplasm of prostate: Secondary | ICD-10-CM | POA: Diagnosis not present

## 2023-03-13 MED ORDER — FLOTUFOLASTAT F 18 GALLIUM 296-5846 MBQ/ML IV SOLN
8.2000 | Freq: Once | INTRAVENOUS | Status: AC
  Administered 2023-03-13: 8.2 via INTRAVENOUS

## 2023-03-16 DIAGNOSIS — G4733 Obstructive sleep apnea (adult) (pediatric): Secondary | ICD-10-CM | POA: Diagnosis not present

## 2023-04-27 DIAGNOSIS — K080 Exfoliation of teeth due to systemic causes: Secondary | ICD-10-CM | POA: Diagnosis not present

## 2023-05-10 DIAGNOSIS — K080 Exfoliation of teeth due to systemic causes: Secondary | ICD-10-CM | POA: Diagnosis not present

## 2023-05-29 DIAGNOSIS — K080 Exfoliation of teeth due to systemic causes: Secondary | ICD-10-CM | POA: Diagnosis not present

## 2023-06-01 DIAGNOSIS — E119 Type 2 diabetes mellitus without complications: Secondary | ICD-10-CM | POA: Diagnosis not present

## 2023-06-05 DIAGNOSIS — Z941 Heart transplant status: Secondary | ICD-10-CM | POA: Diagnosis not present

## 2023-06-05 DIAGNOSIS — I10 Essential (primary) hypertension: Secondary | ICD-10-CM | POA: Diagnosis not present

## 2023-06-05 DIAGNOSIS — D751 Secondary polycythemia: Secondary | ICD-10-CM | POA: Diagnosis not present

## 2023-06-05 DIAGNOSIS — G4733 Obstructive sleep apnea (adult) (pediatric): Secondary | ICD-10-CM | POA: Diagnosis not present

## 2023-06-07 DIAGNOSIS — K080 Exfoliation of teeth due to systemic causes: Secondary | ICD-10-CM | POA: Diagnosis not present

## 2023-06-09 DIAGNOSIS — Z8546 Personal history of malignant neoplasm of prostate: Secondary | ICD-10-CM | POA: Diagnosis not present

## 2023-06-09 DIAGNOSIS — E1122 Type 2 diabetes mellitus with diabetic chronic kidney disease: Secondary | ICD-10-CM | POA: Diagnosis not present

## 2023-06-09 DIAGNOSIS — N182 Chronic kidney disease, stage 2 (mild): Secondary | ICD-10-CM | POA: Diagnosis not present

## 2023-06-09 DIAGNOSIS — Z4821 Encounter for aftercare following heart transplant: Secondary | ICD-10-CM | POA: Diagnosis not present

## 2023-06-09 DIAGNOSIS — Z2989 Encounter for other specified prophylactic measures: Secondary | ICD-10-CM | POA: Diagnosis not present

## 2023-06-09 DIAGNOSIS — Z7985 Long-term (current) use of injectable non-insulin antidiabetic drugs: Secondary | ICD-10-CM | POA: Diagnosis not present

## 2023-06-09 DIAGNOSIS — Z79621 Long term (current) use of calcineurin inhibitor: Secondary | ICD-10-CM | POA: Diagnosis not present

## 2023-06-09 DIAGNOSIS — D751 Secondary polycythemia: Secondary | ICD-10-CM | POA: Diagnosis not present

## 2023-06-09 DIAGNOSIS — I1310 Hypertensive heart and chronic kidney disease without heart failure, with stage 1 through stage 4 chronic kidney disease, or unspecified chronic kidney disease: Secondary | ICD-10-CM | POA: Diagnosis not present

## 2023-06-09 DIAGNOSIS — Z9889 Other specified postprocedural states: Secondary | ICD-10-CM | POA: Diagnosis not present

## 2023-06-09 DIAGNOSIS — Z48298 Encounter for aftercare following other organ transplant: Secondary | ICD-10-CM | POA: Diagnosis not present

## 2023-06-09 DIAGNOSIS — G4733 Obstructive sleep apnea (adult) (pediatric): Secondary | ICD-10-CM | POA: Diagnosis not present

## 2023-06-09 DIAGNOSIS — Z941 Heart transplant status: Secondary | ICD-10-CM | POA: Diagnosis not present

## 2023-06-09 DIAGNOSIS — Z794 Long term (current) use of insulin: Secondary | ICD-10-CM | POA: Diagnosis not present

## 2023-06-09 DIAGNOSIS — Z79899 Other long term (current) drug therapy: Secondary | ICD-10-CM | POA: Diagnosis not present

## 2023-06-09 DIAGNOSIS — R5383 Other fatigue: Secondary | ICD-10-CM | POA: Diagnosis not present

## 2023-06-09 DIAGNOSIS — Z125 Encounter for screening for malignant neoplasm of prostate: Secondary | ICD-10-CM | POA: Diagnosis not present

## 2023-06-09 DIAGNOSIS — R002 Palpitations: Secondary | ICD-10-CM | POA: Diagnosis not present

## 2023-06-20 DIAGNOSIS — Z941 Heart transplant status: Secondary | ICD-10-CM | POA: Diagnosis not present

## 2023-07-03 DIAGNOSIS — G4733 Obstructive sleep apnea (adult) (pediatric): Secondary | ICD-10-CM | POA: Diagnosis not present

## 2023-07-03 DIAGNOSIS — S30860A Insect bite (nonvenomous) of lower back and pelvis, initial encounter: Secondary | ICD-10-CM | POA: Diagnosis not present

## 2023-07-27 DIAGNOSIS — D849 Immunodeficiency, unspecified: Secondary | ICD-10-CM | POA: Diagnosis not present

## 2023-07-27 DIAGNOSIS — Z941 Heart transplant status: Secondary | ICD-10-CM | POA: Diagnosis not present

## 2023-08-02 DIAGNOSIS — F3342 Major depressive disorder, recurrent, in full remission: Secondary | ICD-10-CM | POA: Diagnosis not present

## 2023-08-02 DIAGNOSIS — Z0000 Encounter for general adult medical examination without abnormal findings: Secondary | ICD-10-CM | POA: Diagnosis not present

## 2023-08-02 DIAGNOSIS — E1122 Type 2 diabetes mellitus with diabetic chronic kidney disease: Secondary | ICD-10-CM | POA: Diagnosis not present

## 2023-08-02 DIAGNOSIS — Z1331 Encounter for screening for depression: Secondary | ICD-10-CM | POA: Diagnosis not present

## 2023-08-02 DIAGNOSIS — C61 Malignant neoplasm of prostate: Secondary | ICD-10-CM | POA: Diagnosis not present

## 2023-08-02 DIAGNOSIS — N182 Chronic kidney disease, stage 2 (mild): Secondary | ICD-10-CM | POA: Diagnosis not present

## 2023-08-04 DIAGNOSIS — B078 Other viral warts: Secondary | ICD-10-CM | POA: Diagnosis not present

## 2023-08-04 DIAGNOSIS — L570 Actinic keratosis: Secondary | ICD-10-CM | POA: Diagnosis not present

## 2023-08-04 DIAGNOSIS — Z85828 Personal history of other malignant neoplasm of skin: Secondary | ICD-10-CM | POA: Diagnosis not present

## 2023-08-04 DIAGNOSIS — D1801 Hemangioma of skin and subcutaneous tissue: Secondary | ICD-10-CM | POA: Diagnosis not present

## 2023-08-04 DIAGNOSIS — L821 Other seborrheic keratosis: Secondary | ICD-10-CM | POA: Diagnosis not present

## 2023-08-04 DIAGNOSIS — D2361 Other benign neoplasm of skin of right upper limb, including shoulder: Secondary | ICD-10-CM | POA: Diagnosis not present

## 2023-08-04 DIAGNOSIS — L820 Inflamed seborrheic keratosis: Secondary | ICD-10-CM | POA: Diagnosis not present

## 2023-10-24 DIAGNOSIS — Z8546 Personal history of malignant neoplasm of prostate: Secondary | ICD-10-CM | POA: Diagnosis not present

## 2023-11-14 DIAGNOSIS — N393 Stress incontinence (female) (male): Secondary | ICD-10-CM | POA: Diagnosis not present

## 2023-11-14 DIAGNOSIS — Z8546 Personal history of malignant neoplasm of prostate: Secondary | ICD-10-CM | POA: Diagnosis not present

## 2023-11-14 DIAGNOSIS — K080 Exfoliation of teeth due to systemic causes: Secondary | ICD-10-CM | POA: Diagnosis not present

## 2024-01-10 DIAGNOSIS — R2689 Other abnormalities of gait and mobility: Secondary | ICD-10-CM | POA: Diagnosis not present

## 2024-01-10 DIAGNOSIS — E782 Mixed hyperlipidemia: Secondary | ICD-10-CM | POA: Diagnosis not present

## 2024-01-10 DIAGNOSIS — I5030 Unspecified diastolic (congestive) heart failure: Secondary | ICD-10-CM | POA: Diagnosis not present

## 2024-01-10 DIAGNOSIS — S0990XA Unspecified injury of head, initial encounter: Secondary | ICD-10-CM | POA: Diagnosis not present

## 2024-01-10 DIAGNOSIS — W108XXA Fall (on) (from) other stairs and steps, initial encounter: Secondary | ICD-10-CM | POA: Diagnosis not present

## 2024-01-10 DIAGNOSIS — R519 Headache, unspecified: Secondary | ICD-10-CM | POA: Diagnosis not present

## 2024-02-08 DIAGNOSIS — E782 Mixed hyperlipidemia: Secondary | ICD-10-CM | POA: Diagnosis not present

## 2024-02-08 DIAGNOSIS — I10 Essential (primary) hypertension: Secondary | ICD-10-CM | POA: Diagnosis not present

## 2024-02-08 DIAGNOSIS — I5030 Unspecified diastolic (congestive) heart failure: Secondary | ICD-10-CM | POA: Diagnosis not present

## 2024-02-08 DIAGNOSIS — E1122 Type 2 diabetes mellitus with diabetic chronic kidney disease: Secondary | ICD-10-CM | POA: Diagnosis not present

## 2024-02-08 DIAGNOSIS — N182 Chronic kidney disease, stage 2 (mild): Secondary | ICD-10-CM | POA: Diagnosis not present

## 2024-02-14 DIAGNOSIS — L820 Inflamed seborrheic keratosis: Secondary | ICD-10-CM | POA: Diagnosis not present

## 2024-02-14 DIAGNOSIS — L570 Actinic keratosis: Secondary | ICD-10-CM | POA: Diagnosis not present

## 2024-02-14 DIAGNOSIS — D692 Other nonthrombocytopenic purpura: Secondary | ICD-10-CM | POA: Diagnosis not present

## 2024-02-14 DIAGNOSIS — D2361 Other benign neoplasm of skin of right upper limb, including shoulder: Secondary | ICD-10-CM | POA: Diagnosis not present

## 2024-02-14 DIAGNOSIS — Z85828 Personal history of other malignant neoplasm of skin: Secondary | ICD-10-CM | POA: Diagnosis not present

## 2024-03-05 ENCOUNTER — Other Ambulatory Visit (HOSPITAL_COMMUNITY): Payer: Self-pay | Admitting: Urology

## 2024-03-05 DIAGNOSIS — R9721 Rising PSA following treatment for malignant neoplasm of prostate: Secondary | ICD-10-CM

## 2024-03-12 DIAGNOSIS — R9721 Rising PSA following treatment for malignant neoplasm of prostate: Secondary | ICD-10-CM | POA: Diagnosis not present

## 2024-03-19 ENCOUNTER — Encounter (HOSPITAL_COMMUNITY): Admission: RE | Admit: 2024-03-19 | Discharge: 2024-03-19 | Payer: Self-pay | Attending: Urology

## 2024-03-19 DIAGNOSIS — M87851 Other osteonecrosis, right femur: Secondary | ICD-10-CM | POA: Insufficient documentation

## 2024-03-19 DIAGNOSIS — C772 Secondary and unspecified malignant neoplasm of intra-abdominal lymph nodes: Secondary | ICD-10-CM | POA: Insufficient documentation

## 2024-03-19 DIAGNOSIS — R9721 Rising PSA following treatment for malignant neoplasm of prostate: Secondary | ICD-10-CM | POA: Diagnosis not present

## 2024-03-19 DIAGNOSIS — I700 Atherosclerosis of aorta: Secondary | ICD-10-CM | POA: Insufficient documentation

## 2024-03-19 DIAGNOSIS — K8020 Calculus of gallbladder without cholecystitis without obstruction: Secondary | ICD-10-CM | POA: Insufficient documentation

## 2024-03-19 DIAGNOSIS — Z923 Personal history of irradiation: Secondary | ICD-10-CM | POA: Insufficient documentation

## 2024-03-19 DIAGNOSIS — M87852 Other osteonecrosis, left femur: Secondary | ICD-10-CM | POA: Insufficient documentation

## 2024-03-19 DIAGNOSIS — C7951 Secondary malignant neoplasm of bone: Secondary | ICD-10-CM | POA: Insufficient documentation

## 2024-03-19 MED ORDER — FLOTUFOLASTAT F 18 GALLIUM 296-5846 MBQ/ML IV SOLN
8.7000 | Freq: Once | INTRAVENOUS | Status: AC
  Administered 2024-03-19: 13:00:00 8.7 via INTRAVENOUS

## 2024-04-12 NOTE — Progress Notes (Signed)
  Cancer Center CONSULT NOTE  Patient Care Team: Loreli Kins, MD as PCP - General (Family Medicine) Vertell Pont, RN as Oncology Nurse Navigator  ASSESSMENT & PLAN:  John [name] is a 74 y.o.male with history of prostate cancer, cardiac transplant, on immunosuppressant, polycythemia, diabetes being seen at [facility] for prostate cancer.  Current diagnosis: mHSPC with multifocal osseous metastases, nodal metastases. Initial diagnosis: 2006 with RP and pathology showed GS 4+3 = 7.  iPSA 5.8. Germline testing: drawn today Somatic testing: drawn today Treatment: To be determined.  The patient was counseled on the natural history of prostate cancer and the standard treatment options that are available for prostate cancer.  Given his multifocal metastases, he is considering docetaxel in addition to ADT and ARPI.  Discussed trial like ARASENS. In patients with metastatic, hormone-sensitive prostate cancer received either darolutamide 600-mg twice daily) or matching placebo, both in combination with androgen-deprivation therapy and docetaxel. The overall survival was improved.  However we do not have data without docetaxel in this study. We do not have data without docetaxel in this study. However, from [facility] trial patients with high-volumedisease (n = 513), the median OS was 51.2 months with docetaxel plus ADT therapy versus 34.4 months with ADT alone (HR, 0.63).  That being said, those trials were done from traditional bone scan which less likely to detect the number of metastases he has as with PSMA PET scan today.  Potential side effect including fatigue, neutropenia, febrile neutropenia, thrombocytopenia, anemia, alopecia, skin rash, loss of nail and potentially infection, hypersensitivity, fluid retention with edema, diarrhea, nausea, vomiting, mouth sores, liver enzyme elevation, neuropathy, muscle pain, joint pain, visual change and rarely severe allergic reaction, skin  rash, severe infection resulting in sepsis, and potential death.  Discussed the importance of recognizing fever, signs of infection while on treatment. Proceed to ED for emergency evaluation in the setting of fever, infection. Severe infection resulting in sepsis can be life threatening and result in death if no treated early. 4% of deaths were reported in both group.  ARPI and ADT can cause fatigue, and rarely rash, heart failure, ischemic heart disease in <5%.  In patients undergoing ADT with prostate cancer, a question of manage cardiovascular risk factors to decrease cardiac events recommended including controlling hypertension, hyperlipidemia, prevent diabetes and regular exercises are recommended.   We discussed in the setting of lung transplant, we will need to coordinate with his transplant team.  Will also issue check with pharmacist in regard to drug-drug interactions because he will also be getting other premedications and as needed medications as well.  Heart failure is a potential risk factor.  Baseline cardiac function should be assessed first.  I will inquire our pharmacist regarding abiraterone versus darolutamide in terms of drug interactions.  Patient expressed quality of life is more important than quantity.  In this case, postpone docetaxel usage may be reasonable given most toxicity likely will debride from docetaxel.  He is already immunosuppressed.  I gave him medication information on docetaxel, abiraterone and darolutamide.  He may consider while we communicated with his transplant team.  Assessment & Plan Heart transplant recipient [facility]) Will check helix pharmacogenomics panel metabolizer status for CYP3A5  I contacted and left message to his transplant team. [phone]. [facility] # [id number]. NN Duwaine Potts Prostate cancer [facility]) Recommend ADT plus ARPI AAP vs darolutamide Considering docetaxel Germline and somatic testing  CBC, CMP, PSA and T At risk for side effect of  medication calcium  (1000-1200 mg daily from  food and supplements) and vitamin D3 (1000 IU daily) Healthy lifestyle to prevent diabetes and CV disease Aggressive cardiovascular risk management. Monitor closely with history of cardiac transplant Weight-bearing exercises (30 minutes per day) Limit alcohol consumption and avoid smoking  Orders Placed This Encounter  Procedures   CBC with Differential (Cancer Center Only)    Standing Status:   Future    Number of Occurrences:   1    Expiration Date:   04/19/2025   CMP (Cancer Center only)    Standing Status:   Future    Number of Occurrences:   1    Expiration Date:   04/19/2025   PSA    Standing Status:   Future    Number of Occurrences:   1    Expiration Date:   04/19/2025   Testosterone     Standing Status:   Future    Number of Occurrences:   1    Expiration Date:   04/19/2025   Tacrolimus  level    Standing Status:   Future    Number of Occurrences:   1    Expiration Date:   04/19/2025   [facility] [employer] [facility] Tacrolimus  Test    Do you confirm that the patient will be informed of, and will provide consent to, genetic testing, genomic sequencing, and [employer] ongoing storage of their genetic information and sample for future use?:   Yes    Preferred Collection Location:   In Clinic    Reason for Testing:   Diagnosis    What payment method should be used for this genetic test?:   Bill patient insurance    Previous/Current Medication(s):   tacrolimus     Medication(s) or Drug Class(es) being considered next:   prednisone or steroid    Current Medication(s) or Medication(s)/Drug Class(es) being considered:   steroid/ abiraterone    Specimen ID #:   [id number]   I personally spent a total of 75 minutes in the care of the patient today including getting/reviewing separately obtained history, performing a medically appropriate exam/evaluation, counseling and educating, placing orders, referring and communicating with other health care  professionals, documenting clinical information in the EHR, communicating results, and coordinating care.  All questions were answered. The patient knows to call the clinic with any problems, questions or concerns. No barriers to learning was detected.  Pauletta JAYSON Chihuahua, MD 1/16/20264:50 PM  CHIEF COMPLAINTS/PURPOSE OF CONSULTATION:  Prostate cancer  HISTORY OF PRESENTING ILLNESS:  John [name] 74 y.o. male is here because of prostate cancer. I have reviewed his chart and materials related to his cancer extensively and collaborated history with the patient. Summary of oncologic history is as follows:  Patient is a 74 year old male with history of prostate cancer, arthritis, diabetes, hypertension, MI, sleep apnea, GERD referred for prostate cancer.  Primary urologist is Dr. Cam.  2006 diagnosis of prostate cancer.  Patient underwent RP and pathology showed GS 4+3 equal 7.  iPSA 5.8. Margins negative.  2012 Biochemical recurrence and treated with salvage radiation.  07/2021 PSA 3.86 11/2021 PSA 3.43 03/2022 PSA 4.47 05/2022 PSA 4.58 08/2022 PSA 4.65 02/2023 PSA 5.41 12/24 PSMA PET showed presacral nodes with minimal enhancement.  Speculation as to whether this is due to different contrast media.  No additional finding consistent with metastatic disease.  06/2023 PSA 7.72 10/2023 PSA 7.4  03/2024 16.7 12/25 PSMA PET showed development of abdominal and retrocrural tracer nodal metastases.  Mild response to therapy of pelvic nodal metastases.  Development of multifocal  osseous metastases approximately 9 small lesions.   MEDICAL HISTORY:  Past Medical History:  Diagnosis Date   Arthritis    Cancer (HCC)    prostate   Complication of anesthesia    Problems with waking up and with intubation   Diabetes mellitus without complication (HCC)    GERD (gastroesophageal reflux disease)    Hypertension    Myocardial infarction [facility])    Sleep apnea    cpap    SURGICAL HISTORY: Past  Surgical History:  Procedure Laterality Date   APPENDECTOMY     BIOPSY  03/24/2021   Procedure: BIOPSY;  Surgeon: Dianna Specking, MD;  Location: [facility] ENDOSCOPY;  Service: Endoscopy;;   CARDIAC CATHETERIZATION     Novemer 2014   COLONOSCOPY     COLONOSCOPY WITH PROPOFOL  N/A 06/03/2013   Procedure: COLONOSCOPY WITH PROPOFOL ;  Surgeon: Specking KYM Dianna, MD;  Location: [facility] ENDOSCOPY;  Service: Endoscopy;  Laterality: N/A;   COLONOSCOPY WITH PROPOFOL  N/A 03/24/2021   Procedure: COLONOSCOPY WITH PROPOFOL ;  Surgeon: Dianna Specking, MD;  Location: [facility] ENDOSCOPY;  Service: Endoscopy;  Laterality: N/A;   ESOPHAGOGASTRODUODENOSCOPY (EGD) WITH PROPOFOL  N/A 06/03/2013   Procedure: ESOPHAGOGASTRODUODENOSCOPY (EGD) WITH PROPOFOL ;  Surgeon: Specking KYM Dianna, MD;  Location: [facility] ENDOSCOPY;  Service: Endoscopy;  Laterality: N/A;   HEART TRANSPLANT     Hx; of x 2, 2000 and 2013   INGUINAL HERNIA REPAIR Right 08/24/2016   Procedure: LAPAROSCOPIC  RIGHT INGUINAL HERNIA WITH MESH;  Surgeon: Rubin Calamity, MD;  [facility] OR;  Service: General;  Laterality: Right;   PARTIAL KNEE ARTHROPLASTY Left 02/15/2022   Procedure: UNICOMPARTMENTAL KNEE;  Surgeon: Josefina Chew, MD;  Location: [facility] ORS;  Service: Orthopedics;  Laterality: Left;   PROSTATECTOMY      SOCIAL HISTORY: Social History   Socioeconomic History   Marital status: Married    Spouse name: Not on file   Number of children: Not on file   Years of education: Not on file   Highest education level: Not on file  Occupational History   Not on file  Tobacco Use   Smoking status: Former    Current packs/day: 0.00    Average packs/day: 1 pack/day for 40.0 years (40.0 ttl pk-yrs)    Types: Cigarettes    Start date: 84    Quit date: 10    Years since quitting: 28.0   Smokeless tobacco: Never   Tobacco comments:    quit smoking cigarettes in 1998  Vaping Use   Vaping status: Never Used  Substance and Sexual Activity   Alcohol use:  Yes    Comment: rare   Drug use: No   Sexual activity: Not on file  Other Topics Concern   Not on file  Social History Narrative   Not on file   Social Drivers of Health   Tobacco Use: Medium Risk (06/09/2023)   Received [facility]   Patient History    Smoking Tobacco Use: Former    Smokeless Tobacco Use: Never    Passive Exposure: Not on file  Financial Resource Strain: Not on file  Food Insecurity: No Food Insecurity (04/15/2024)   Epic    Worried About Radiation Protection Practitioner of Food in the Last Year: Never true    Ran Out of Food in the Last Year: Never true  Transportation Needs: No Transportation Needs (04/15/2024)   Epic    Lack of Transportation (Medical): No    Lack of Transportation (Non-Medical): No  Physical Activity: Not on  file  Stress: Not on file  Social Connections: Not on file  Intimate Partner Violence: Not At Risk (02/15/2022)   Humiliation, Afraid, Rape, and Kick questionnaire    Fear of Current or Ex-Partner: No    Emotionally Abused: No    Physically Abused: No    Sexually Abused: No  Depression (PHQ2-9): Low Risk (04/19/2024)   Depression (PHQ2-9)    PHQ-2 Score: 0  Alcohol Screen: Not on file  Housing: Low Risk (04/15/2024)   Epic    Unable to Pay for Housing in the Last Year: No    Number of Times Moved in the Last Year: 0    Homeless in the Last Year: No  Utilities: Not At Risk (04/15/2024)   Epic    Threatened with loss of utilities: No  Health Literacy: Not on file    FAMILY HISTORY: Family History  Problem Relation Age of Onset   Heart disease Mother    Heart disease Father    Diabetes Brother    Cancer Brother    Cancer Niece    Cancer Niece     ALLERGIES:  is allergic to metformin and related.  MEDICATIONS:  Current Outpatient Medications  Medication Sig Dispense Refill   tacrolimus  (PROGRAF ) 0.5 MG capsule Take 0.5 mg by mouth 2 (two) times daily. (Patient taking differently: Take 1 mg by mouth 2 (two) times  daily. 1mg  AM, 0.5MG  PM)     triamcinolone  cream (KENALOG) 0.1 % Apply topically 2 (two) times daily as needed.     ACCU-CHEK FASTCLIX LANCETS MISC Use to check blood sugar 6 times per day dx code E11.9 204 each 3   amitriptyline  (ELAVIL ) 25 MG tablet TAKE 1 TABLET AT BEDTIME FOR 1 WEEK. THEN, CALL DR. LORD OFFICE TO REPORT MEDICATION EFFECTS. IF YOU ARE TOLERATING IT WELL, AFTER 1 WEEK CAN INCREASE TO 2 TABLETS AT BEDTIME (Patient not taking: Reported on 06/15/2022) 180 tablet 2   aspirin  EC 81 MG tablet Take 81 mg by mouth daily. Swallow whole.     Calcium  Carb-Cholecalciferol (CALCIUM  600/VITAMIN D PO) Take 1 tablet by mouth in the morning and at bedtime.     citalopram  (CELEXA ) 20 MG tablet Take 20 mg by mouth at bedtime.     glucose blood (FREESTYLE TEST STRIPS) test strip Use as instructed to check blood sugar 6 times per day dx code E11.9 (Patient not taking: Reported on 04/19/2024) 200 each 3   insulin  aspart (NOVOLOG ) 100 UNIT/ML injection Inject 160 Units into the skin See admin instructions. About every 3 days with Omnipod insulin  pump (Patient not taking: Reported on 04/19/2024)     Insulin  Disposable Pump (OMNIPOD) MISC Use every 3 days 30 each 2   lisinopril  (PRINIVIL ,ZESTRIL ) 10 MG tablet Take 10 mg by mouth daily.     mycophenolate  (CELLCEPT ) 500 MG tablet Take 500 mg by mouth 2 (two) times daily.     Omega-3 Fatty Acids (FISH OIL) 1200 MG CAPS Take 2,400 mg by mouth 2 (two) times daily.     omeprazole (PRILOSEC) 20 MG capsule Take 20 mg by mouth daily.     OZEMPIC, 2 MG/DOSE, 8 MG/3ML SOPN Inject 2 mg into the skin once a week.     pregabalin  (LYRICA ) 75 MG capsule When ready to start taper, take morning dose to 1 capsule and evening dose to 2 capsules for 3 days. Then, reduce to 1 capsule twice daily for 3 days. Then only 1 capsule at night for 3-5 days. Then  stop. (Patient not taking: Reported on 04/19/2024) 30 capsule 0   rosuvastatin  (CRESTOR ) 10 MG tablet Take 10 mg by mouth  daily.     No current facility-administered medications for this visit.    REVIEW OF SYSTEMS:   All relevant systems were reviewed with the patient and are negative.  PHYSICAL EXAMINATION: ECOG PERFORMANCE STATUS: 0  Vitals:   04/19/24 1211  BP: 129/81  Pulse: 87  Resp: 17  Temp: 97.6 F (36.4 C)  SpO2: 99%   Filed Weights   04/19/24 1211  Weight: 149 lb 11.2 oz (67.9 kg)    GENERAL: alert, no distress and comfortable SKIN: skin color is normal, no jaundice, rashes EYES: sclera clear OROPHARYNX: no exudate, no erythema NECK: supple LYMPH:  no palpable lymphadenopathy in the cervical regions LUNGS: Effort normal, no respiratory distress.  Clear to auscultation bilaterally HEART: regular rate & rhythm and no lower extremity edema ABDOMEN: soft, non-tender and nondistended Musculoskeletal: no point tenderness  LABORATORY DATA:  I have reviewed the results of PSA.  RADIOGRAPHIC STUDIES: I have personally reviewed the radiological images as listed and agreed with the findings in the report.

## 2024-04-19 ENCOUNTER — Inpatient Hospital Stay

## 2024-04-19 ENCOUNTER — Other Ambulatory Visit: Payer: Self-pay

## 2024-04-19 ENCOUNTER — Telehealth: Payer: Self-pay

## 2024-04-19 VITALS — BP 129/81 | HR 87 | Temp 97.6°F | Resp 17 | Ht 65.0 in | Wt 149.7 lb

## 2024-04-19 DIAGNOSIS — Z923 Personal history of irradiation: Secondary | ICD-10-CM | POA: Diagnosis not present

## 2024-04-19 DIAGNOSIS — C61 Malignant neoplasm of prostate: Secondary | ICD-10-CM | POA: Diagnosis not present

## 2024-04-19 DIAGNOSIS — Z941 Heart transplant status: Secondary | ICD-10-CM | POA: Insufficient documentation

## 2024-04-19 DIAGNOSIS — Z87891 Personal history of nicotine dependence: Secondary | ICD-10-CM | POA: Insufficient documentation

## 2024-04-19 DIAGNOSIS — Z9189 Other specified personal risk factors, not elsewhere classified: Secondary | ICD-10-CM | POA: Insufficient documentation

## 2024-04-19 DIAGNOSIS — Z9079 Acquired absence of other genital organ(s): Secondary | ICD-10-CM

## 2024-04-19 DIAGNOSIS — C7951 Secondary malignant neoplasm of bone: Secondary | ICD-10-CM | POA: Diagnosis not present

## 2024-04-19 LAB — CBC WITH DIFFERENTIAL (CANCER CENTER ONLY)
Abs Immature Granulocytes: 0 K/uL (ref 0.00–0.07)
Basophils Absolute: 0 K/uL (ref 0.0–0.1)
Basophils Relative: 0 %
Eosinophils Absolute: 0.1 K/uL (ref 0.0–0.5)
Eosinophils Relative: 2 %
HCT: 53.9 % — ABNORMAL HIGH (ref 39.0–52.0)
Hemoglobin: 19.3 g/dL — ABNORMAL HIGH (ref 13.0–17.0)
Immature Granulocytes: 0 %
Lymphocytes Relative: 31 %
Lymphs Abs: 1.6 K/uL (ref 0.7–4.0)
MCH: 31.1 pg (ref 26.0–34.0)
MCHC: 35.8 g/dL (ref 30.0–36.0)
MCV: 86.8 fL (ref 80.0–100.0)
Monocytes Absolute: 0.4 K/uL (ref 0.1–1.0)
Monocytes Relative: 9 %
Neutro Abs: 3 K/uL (ref 1.7–7.7)
Neutrophils Relative %: 58 %
Platelet Count: 149 K/uL — ABNORMAL LOW (ref 150–400)
RBC: 6.21 MIL/uL — ABNORMAL HIGH (ref 4.22–5.81)
RDW: 12.7 % (ref 11.5–15.5)
WBC Count: 5.2 K/uL (ref 4.0–10.5)
nRBC: 0 % (ref 0.0–0.2)

## 2024-04-19 LAB — CMP (CANCER CENTER ONLY)
ALT: 25 U/L (ref 0–44)
AST: 23 U/L (ref 15–41)
Albumin: 4.9 g/dL (ref 3.5–5.0)
Alkaline Phosphatase: 65 U/L (ref 38–126)
Anion gap: 11 (ref 5–15)
BUN: 13 mg/dL (ref 8–23)
CO2: 28 mmol/L (ref 22–32)
Calcium: 10.1 mg/dL (ref 8.9–10.3)
Chloride: 100 mmol/L (ref 98–111)
Creatinine: 0.97 mg/dL (ref 0.61–1.24)
GFR, Estimated: >60 mL/min
Glucose, Bld: 113 mg/dL — ABNORMAL HIGH (ref 70–99)
Potassium: 4.8 mmol/L (ref 3.5–5.1)
Sodium: 139 mmol/L (ref 135–145)
Total Bilirubin: 1.3 mg/dL — ABNORMAL HIGH (ref 0.0–1.2)
Total Protein: 7.6 g/dL (ref 6.5–8.1)

## 2024-04-19 LAB — MISCELLANEOUS TEST

## 2024-04-19 LAB — PSA: Prostatic Specific Antigen: 26.7 ng/mL — ABNORMAL HIGH (ref 0.00–4.00)

## 2024-04-19 LAB — GENETIC SCREENING ORDER

## 2024-04-19 NOTE — Assessment & Plan Note (Signed)
 calcium  (1000-1200 mg daily from food and supplements) and vitamin D3 (1000 IU daily) Healthy lifestyle to prevent diabetes and CV disease Aggressive cardiovascular risk management. Monitor closely with history of cardiac transplant Weight-bearing exercises (30 minutes per day) Limit alcohol consumption and avoid smoking

## 2024-04-19 NOTE — Assessment & Plan Note (Signed)
 Will check helix pharmacogenomics panel metabolizer status for CYP3A5  I contacted and left message to his transplant team. [phone]. [facility] # [id number]. NN John [name]

## 2024-04-20 LAB — TESTOSTERONE: Testosterone: 335 ng/dL (ref 264–916)

## 2024-04-20 NOTE — Addendum Note (Signed)
 Addended by: DELPHINE BRUNO HERO on: 04/20/2024 04:27 PM   Modules accepted: Orders

## 2024-04-22 LAB — TACROLIMUS LEVEL: Tacrolimus (FK506) - LabCorp: 8.5 ng/mL (ref 5.0–20.0)

## 2024-04-25 NOTE — Progress Notes (Addendum)
 PATIENT NAVIGATOR PROGRESS NOTE  Name: John [name] Date: 04/25/2024 MRN: [id number]  DOB: 1950-06-13   Reason for visit:  mHSPC with multifocal osseous metastases, nodal metastases   Recommendations:    Germline and Somatic testing (Drawn today, 1/16)  Consideration of Taxotere.   ARPI (Pending pharmacy review for drug-drug interactions due to medications he is on for heart transplant)  Dr. Tina and myself will reach out to patient's transplant team at [facility] as well.

## 2024-04-25 NOTE — Progress Notes (Signed)
 [facility]us/Ambry orders entered into portal.

## 2024-05-03 NOTE — Progress Notes (Signed)
 RN left additional voicemail with patient's transplant coordinator at [facility] to review medication recommendations prior to start to ensure no contraindications.   RN spoke with patient and he is aware.  Patient will also reach out to [facility] to provide my contact again to help coordination of care.  Will continue to follow.

## 2024-05-03 NOTE — Progress Notes (Signed)
 Received call back from patients transplant team at [facility].  Dr. Bulah reviewed recommendations and feels that there is no concerns from a transplant standpoint and encourages use of any/all therapies.   RN notified patient, verbalized understanding.  This will be communicated to prescribing MD for start of an ARPI.   Patient does wish to proceed with targeted therapy, and is open minded to chemotherapy, however would like to see his response/tolerance to ARPI prior.   Plan of care in progress.  Will continue to follow.

## 2024-05-07 ENCOUNTER — Telehealth: Payer: Self-pay

## 2024-05-07 ENCOUNTER — Other Ambulatory Visit: Payer: Self-pay

## 2024-05-07 ENCOUNTER — Other Ambulatory Visit (HOSPITAL_COMMUNITY): Payer: Self-pay

## 2024-05-07 DIAGNOSIS — C61 Malignant neoplasm of prostate: Secondary | ICD-10-CM

## 2024-05-07 MED ORDER — RELUGOLIX 120 MG PO TABS: ORAL_TABLET | ORAL | 0 refills | Status: AC

## 2024-05-07 MED ORDER — RELUGOLIX 120 MG PO TABS: ORAL_TABLET | ORAL | 11 refills | Status: AC

## 2024-05-07 MED ORDER — DAROLUTAMIDE 300 MG PO TABS: 600.0000 mg | ORAL_TABLET | Freq: Two times a day (BID) | ORAL | 11 refills | Status: AC

## 2024-05-07 NOTE — Progress Notes (Signed)
 Orgovyx  ordered.   Cardiac transplant team reply with no concerns on his treatment for prostate cancer.

## 2024-05-07 NOTE — Telephone Encounter (Signed)
 Oral Oncology Patient Advocate Encounter  Prior Authorization for Abiraterone 250mg  has been approved.    PA# [id number] Effective dates: 05/07/24 through 05/07/25  Patients co-pay is $162.89.    Lucie Lamer, CPhT [place]  [facility] Specialty Pharmacy Services Oncology Pharmacy Patient Advocate Specialist [facility] [place] Phone: [phone]  Fax: [phone] [email or web address]

## 2024-05-07 NOTE — Telephone Encounter (Signed)
 Oral Oncology Patient Advocate Encounter  After completing a benefits investigation, prior authorization for Orgovyx  120mg  is not required at this time through [facility].  Patient's copay is $903.15.     Lucie Lamer, CPhT [place]  [facility] Specialty Pharmacy Services Oncology Pharmacy Patient Advocate Specialist II [facility] [place] Phone: [phone]  Fax: [phone] [email or web address]

## 2024-05-07 NOTE — Telephone Encounter (Signed)
 Oral Oncology Patient Advocate Encounter   Received notification that prior authorization for Nubeqa  300mg  is required.   PA submitted on 05/07/24 Key [id number] Status is pending     Lucie Lamer, CPhT [place]  [facility] Specialty Pharmacy Services Oncology Pharmacy Patient Advocate Specialist [facility] [place] Phone: [phone]  Fax: [phone] [email or web address]

## 2024-05-07 NOTE — Telephone Encounter (Signed)
 Oral Oncology Patient Advocate Encounter   Received notification that prior authorization for Abiraterone Acetate 250MG  tablets  is required.   PA submitted on 05/07/24 Key [id number] Status is pending     Lucie Lamer, CPhT [place]  [facility] Specialty Pharmacy Services Oncology Pharmacy Patient Advocate Specialist II [facility] [place] Phone: [phone]  Fax: [phone] [email or web address]

## 2024-05-07 NOTE — Progress Notes (Signed)
 Darolutamide  600 mg twice daily sent to pharmacy.

## 2024-05-07 NOTE — Telephone Encounter (Signed)
 Oral Oncology Pharmacist Encounter  Received new prescription for Nubeqa  (darolutamide ) and Orgovyx  (relugolix ) for the treatment of metastatic hormone sensitive prostate cancer, planned duration until disease progression or unacceptable toxicity.  Labs from 04/19/2024 (CBC, CMP, PSA, tacrolimus  level assessed, no interventions needed. Both prescriptions doses and frequencies were assessed for appropriateness.   Current medication list in Epic reviewed, DDIs with Nubeqa  identified: - tacrolimus  (cat C): weak CYP3A4 inducers may decrease the serum concentration of tacrolimus . MD has discussed with transplant team. Will need to monitor tacrolimus  levels while patient begins therapy.  - crestor  (cat D): nubeqa  may increase the concentration of crestor  requiring a dose limit to 5mg . Will discuss with MD to get medifcation decreased to 5mg .    Current medication list in Epic reviewed, no significant/ relevant DDIs with Orgovyx  identified.   Evaluated chart and no patient barriers to medication adherence noted.   Patient agreement for treatment documented in MD note on 04/19/2024.  Prescription has been e-scribed to the [facility] for benefits analysis and approval.  Oral Oncology Clinic will continue to follow for insurance authorization, copayment issues, initial counseling and start date.  Ketina Mars, PharmD Hematology/Oncology Clinical Pharmacist [place] Oral Chemotherapy Navigation Clinic [phone] 05/07/2024 3:29 PM

## 2024-05-07 NOTE — Telephone Encounter (Signed)
 Oral Oncology Patient Advocate Encounter   Began application for assistance for darolutamide  (NUBEQA ) 300 MG tablet  through [employer].   Application will be submitted upon completion of necessary supporting documentation.   [employer] phone number [phone].   I will continue to check the status until final determination.   Lucie Lamer, CPhT [place]  [facility] Specialty Pharmacy Services Oncology Pharmacy Patient Advocate Specialist [facility] [place] Phone: [phone]  Fax: [phone] [email or web address]

## 2024-05-07 NOTE — Telephone Encounter (Signed)
 Oral Oncology Patient Advocate Encounter  Prior Authorization for Nubeqa  300mg   has been approved.    PA# [id number] Effective dates: 05/07/24 through 05/07/25  Patients co-pay is $2,100.    Lucie Lamer, CPhT [place]  [facility] Specialty Pharmacy Services Oncology Pharmacy Patient Advocate Specialist II [facility] [place] Phone: [phone]  Fax: [phone] [email or web address]

## 2024-05-09 NOTE — Telephone Encounter (Signed)
 Forms faxed in today 05/09/24  Lucie Lamer, CPhT [place]  [facility] Specialty Pharmacy Services Oncology Pharmacy Patient Advocate Specialist II [facility] [place] Phone: [phone]  Fax: [phone] [email or web address]

## 2024-05-09 NOTE — Progress Notes (Signed)
 Ambry results negative.  Copy will be scanned into Epic.
